# Patient Record
Sex: Female | Born: 1985
Health system: Southern US, Community
[De-identification: ages and names within clinical notes are randomized; demographics above are authoritative.]

## PROBLEM LIST (undated history)

## (undated) ENCOUNTER — Inpatient Hospital Stay (HOSPITAL_COMMUNITY): Payer: Self-pay

## (undated) DIAGNOSIS — R319 Hematuria, unspecified: Secondary | ICD-10-CM

## (undated) DIAGNOSIS — Z87448 Personal history of other diseases of urinary system: Secondary | ICD-10-CM

## (undated) DIAGNOSIS — I10 Essential (primary) hypertension: Secondary | ICD-10-CM

## (undated) DIAGNOSIS — E119 Type 2 diabetes mellitus without complications: Secondary | ICD-10-CM

## (undated) DIAGNOSIS — N189 Chronic kidney disease, unspecified: Secondary | ICD-10-CM

## (undated) DIAGNOSIS — N200 Calculus of kidney: Secondary | ICD-10-CM

## (undated) DIAGNOSIS — N62 Hypertrophy of breast: Secondary | ICD-10-CM

## (undated) DIAGNOSIS — F99 Mental disorder, not otherwise specified: Secondary | ICD-10-CM

## (undated) DIAGNOSIS — G43909 Migraine, unspecified, not intractable, without status migrainosus: Secondary | ICD-10-CM

## (undated) DIAGNOSIS — F329 Major depressive disorder, single episode, unspecified: Secondary | ICD-10-CM

## (undated) DIAGNOSIS — Z8742 Personal history of other diseases of the female genital tract: Secondary | ICD-10-CM

## (undated) DIAGNOSIS — M797 Fibromyalgia: Secondary | ICD-10-CM

## (undated) DIAGNOSIS — F32A Depression, unspecified: Secondary | ICD-10-CM

## (undated) HISTORY — DX: Hypertrophy of breast: N62

## (undated) HISTORY — DX: Depression, unspecified: F32.A

## (undated) HISTORY — DX: Type 2 diabetes mellitus without complications: E11.9

## (undated) HISTORY — DX: Personal history of other diseases of the female genital tract: Z87.42

## (undated) HISTORY — DX: Calculus of kidney: N20.0

## (undated) HISTORY — DX: Personal history of other diseases of urinary system: Z87.448

## (undated) HISTORY — PX: WISDOM TOOTH EXTRACTION: SHX21

## (undated) HISTORY — DX: Essential (primary) hypertension: I10

## (undated) HISTORY — PX: DENTAL SURGERY: SHX609

## (undated) HISTORY — DX: Hematuria, unspecified: R31.9

## (undated) HISTORY — DX: Mental disorder, not otherwise specified: F99

## (undated) HISTORY — DX: Chronic kidney disease, unspecified: N18.9

---
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
## 2006-01-02 ENCOUNTER — Ambulatory Visit (HOSPITAL_COMMUNITY): Admission: RE | Admit: 2006-01-02 | Discharge: 2006-01-02 | Payer: Self-pay | Admitting: Internal Medicine

## 2006-05-07 ENCOUNTER — Ambulatory Visit (HOSPITAL_COMMUNITY): Admission: RE | Admit: 2006-05-07 | Discharge: 2006-05-07 | Payer: Self-pay | Admitting: Urology

## 2006-07-03 ENCOUNTER — Ambulatory Visit (HOSPITAL_COMMUNITY): Admission: RE | Admit: 2006-07-03 | Discharge: 2006-07-03 | Payer: Self-pay | Admitting: Internal Medicine

## 2007-01-22 ENCOUNTER — Other Ambulatory Visit: Admission: RE | Admit: 2007-01-22 | Discharge: 2007-01-22 | Payer: Self-pay | Admitting: Obstetrics and Gynecology

## 2007-01-29 ENCOUNTER — Encounter: Admission: RE | Admit: 2007-01-29 | Discharge: 2007-01-29 | Payer: Self-pay | Admitting: Obstetrics & Gynecology

## 2007-05-07 ENCOUNTER — Ambulatory Visit (HOSPITAL_COMMUNITY): Admission: RE | Admit: 2007-05-07 | Discharge: 2007-05-07 | Payer: Self-pay | Admitting: Neurology

## 2008-07-18 ENCOUNTER — Other Ambulatory Visit: Admission: RE | Admit: 2008-07-18 | Discharge: 2008-07-18 | Payer: Self-pay | Admitting: Obstetrics & Gynecology

## 2010-04-14 ENCOUNTER — Encounter: Payer: Self-pay | Admitting: Neurology

## 2010-11-21 ENCOUNTER — Ambulatory Visit (HOSPITAL_COMMUNITY): Payer: Self-pay | Admitting: Psychiatry

## 2010-12-19 ENCOUNTER — Ambulatory Visit (HOSPITAL_COMMUNITY): Payer: Self-pay | Admitting: Psychiatry

## 2011-02-25 ENCOUNTER — Other Ambulatory Visit: Payer: Self-pay | Admitting: Obstetrics & Gynecology

## 2011-06-10 ENCOUNTER — Other Ambulatory Visit (HOSPITAL_COMMUNITY)
Admission: RE | Admit: 2011-06-10 | Discharge: 2011-06-10 | Disposition: A | Discharge status: Home / Self Care | Payer: BC Managed Care – PPO | Source: Ambulatory Visit | Attending: Obstetrics & Gynecology | Admitting: Obstetrics & Gynecology

## 2011-06-10 ENCOUNTER — Other Ambulatory Visit: Payer: Self-pay | Admitting: Obstetrics & Gynecology

## 2011-06-10 DIAGNOSIS — Z01419 Encounter for gynecological examination (general) (routine) without abnormal findings: Secondary | ICD-10-CM | POA: Insufficient documentation

## 2011-06-25 ENCOUNTER — Ambulatory Visit (HOSPITAL_COMMUNITY)
Admission: RE | Admit: 2011-06-25 | Discharge: 2011-06-25 | Disposition: A | Discharge status: Home / Self Care | Payer: BC Managed Care – PPO | Source: Ambulatory Visit | Attending: Internal Medicine | Admitting: Internal Medicine

## 2011-06-25 ENCOUNTER — Other Ambulatory Visit (HOSPITAL_COMMUNITY): Payer: Self-pay | Admitting: Internal Medicine

## 2011-06-25 DIAGNOSIS — R937 Abnormal findings on diagnostic imaging of other parts of musculoskeletal system: Secondary | ICD-10-CM | POA: Insufficient documentation

## 2011-06-25 DIAGNOSIS — M79609 Pain in unspecified limb: Secondary | ICD-10-CM | POA: Insufficient documentation

## 2011-06-25 DIAGNOSIS — R52 Pain, unspecified: Secondary | ICD-10-CM

## 2011-06-25 DIAGNOSIS — M25539 Pain in unspecified wrist: Secondary | ICD-10-CM | POA: Insufficient documentation

## 2011-09-06 ENCOUNTER — Encounter (HOSPITAL_COMMUNITY): Payer: Self-pay | Admitting: *Deleted

## 2011-09-06 ENCOUNTER — Emergency Department (HOSPITAL_COMMUNITY)
Admission: EM | Admit: 2011-09-06 | Discharge: 2011-09-06 | Disposition: A | Discharge status: Home / Self Care | Payer: BC Managed Care – PPO | Attending: Emergency Medicine | Admitting: Emergency Medicine

## 2011-09-06 DIAGNOSIS — H9209 Otalgia, unspecified ear: Secondary | ICD-10-CM | POA: Insufficient documentation

## 2011-09-06 DIAGNOSIS — J029 Acute pharyngitis, unspecified: Secondary | ICD-10-CM | POA: Insufficient documentation

## 2011-09-06 HISTORY — DX: Fibromyalgia: M79.7

## 2011-09-06 LAB — RAPID STREP SCREEN (MED CTR MEBANE ONLY): Streptococcus, Group A Screen (Direct): NEGATIVE

## 2011-09-06 MED ORDER — IBUPROFEN 800 MG PO TABS
800.0000 mg | ORAL_TABLET | Freq: Once | ORAL | Status: AC
  Administered 2011-09-06: 800 mg via ORAL
  Filled 2011-09-06: qty 1

## 2011-09-06 NOTE — ED Provider Notes (Signed)
History     CSN: [id number]  Arrival date & time 09/06/11  2012   First MD Initiated Contact with Patient 09/06/11 2039      Chief Complaint  Patient presents with  . Sore Throat    (Consider location/radiation/quality/duration/timing/severity/associated sxs/prior treatment) HPI Comments: Pt of dr. Ouida Sills   Patient is a 26 y.o. female presenting with pharyngitis. The history is provided by the patient. No language interpreter was used.  Sore Throat This is a new problem. Episode onset: 2 days ago.  mild ear pain.  no fever or chills no cough. The problem occurs constantly. The problem has been unchanged. Associated symptoms include a sore throat. Pertinent negatives include no chills, coughing, fever, headaches, rash or swollen glands. The symptoms are aggravated by swallowing. She has tried acetaminophen for the symptoms. The treatment provided no relief.    Past Medical History  Diagnosis Date  . Fibromyalgia     History reviewed. No pertinent past surgical history.  No family history on file.  History  Substance Use Topics  . Smoking status: Never Smoker   . Smokeless tobacco: Not on file  . Alcohol Use: No    OB History    Grav Para Term Preterm Abortions TAB SAB Ect Mult Living                  Review of Systems  Constitutional: Negative for fever and chills.  HENT: Positive for ear pain and sore throat.   Respiratory: Negative for cough.   Skin: Negative for rash.  Neurological: Negative for headaches.  All other systems reviewed and are negative.    Allergies  Review of patient's allergies indicates no known allergies.  Home Medications  No current outpatient prescriptions on file.  BP 145/81  Pulse 91  Temp 98.1 F (36.7 C) (Oral)  Resp 20  Ht 5\' 1"  (1.549 m)  Wt 206 lb (93.441 kg)  BMI 38.92 kg/m2  SpO2 100%  LMP 08/23/2011  Physical Exam  Nursing note and vitals reviewed. Constitutional: She is oriented to person, place, and time.  Vital signs are normal. She appears well-developed and well-nourished. She is cooperative. No distress.  HENT:  Head: Normocephalic and atraumatic. No trismus in the jaw.  Right Ear: External ear normal.  Left Ear: External ear normal.  Mouth/Throat: Uvula is midline and mucous membranes are normal. No uvula swelling. Posterior oropharyngeal erythema present. No oropharyngeal exudate, posterior oropharyngeal edema or tonsillar abscesses.  Eyes: EOM are normal.  Neck: Normal range of motion and phonation normal.  Cardiovascular: Normal rate, regular rhythm and normal heart sounds.   Pulmonary/Chest: Effort normal and breath sounds normal. No respiratory distress.  Abdominal: Soft. She exhibits no distension. There is no tenderness.  Musculoskeletal: Normal range of motion.  Neurological: She is alert and oriented to person, place, and time.  Skin: Skin is warm and dry.  Psychiatric: She has a normal mood and affect. Judgment normal.    ED Course  Procedures (including critical care time)   Labs Reviewed  RAPID STREP SCREEN   No results found.   1. Pharyngitis       MDM  Salt water gargles, chloraseptic.  Tylenol or ibuprofen for fever or discomfort.        Worthy Rancher, PA 09/06/11 2120

## 2011-09-06 NOTE — ED Provider Notes (Signed)
Medical screening examination/treatment/procedure(s) were performed by non-physician practitioner and as supervising physician I was immediately available for consultation/collaboration.   Rmoni Keplinger B. Shirl Weir, MD 09/06/11 2131 

## 2011-09-06 NOTE — Discharge Instructions (Signed)
Pharyngitis, Viral and Bacterial Pharyngitis is soreness (inflammation) or infection of the pharynx. It is also called a sore throat. CAUSES  Most sore throats are caused by viruses and are part of a cold. However, some sore throats are caused by strep and other bacteria. Sore throats can also be caused by post nasal drip from draining sinuses, allergies and sometimes from sleeping with an open mouth. Infectious sore throats can be spread from person to person by coughing, sneezing and sharing cups or eating utensils. TREATMENT  Sore throats that are viral usually last 3-4 days. Viral illness will get better without medications (antibiotics). Strep throat and other bacterial infections will usually begin to get better about 24-48 hours after you begin to take antibiotics. HOME CARE INSTRUCTIONS   If the caregiver feels there is a bacterial infection or if there is a positive strep test, they will prescribe an antibiotic. The full course of antibiotics must be taken. If the full course of antibiotic is not taken, you or your child may become ill again. If you or your child has strep throat and do not finish all of the medication, serious heart or kidney diseases may develop.   Drink enough water and fluids to keep your urine clear or pale yellow.   Only take over-the-counter or prescription medicines for pain, discomfort or fever as directed by your caregiver.   Get lots of rest.   Gargle with salt water ( tsp. of salt in a glass of water) as often as every 1-2 hours as you need for comfort.   Hard candies may soothe the throat if individual is not at risk for choking. Throat sprays or lozenges may also be used.  SEEK MEDICAL CARE IF:   Large, tender lumps in the neck develop.   A rash develops.   Green, yellow-brown or bloody sputum is coughed up.   Your baby is older than 3 months with a rectal temperature of 100.5 F (38.1 C) or higher for more than 1 day.  SEEK IMMEDIATE MEDICAL CARE  IF:   A stiff neck develops.   You or your child are drooling or unable to swallow liquids.   You or your child are vomiting, unable to keep medications or liquids down.   You or your child has severe pain, unrelieved with recommended medications.   You or your child are having difficulty breathing (not due to stuffy nose).   You or your child are unable to fully open your mouth.   You or your child develop redness, swelling, or severe pain anywhere on the neck.   You have a fever.   Your baby is older than 3 months with a rectal temperature of 102 F (38.9 C) or higher.   Your baby is 22 months old or younger with a rectal temperature of 100.4 F (38 C) or higher.  MAKE SURE YOU:   Understand these instructions.   Will watch your condition.   Will get help right away if you are not doing well or get worse.  Document Released: 03/10/2005 Document Revised: 02/27/2011 Document Reviewed: 06/07/2007 [facility] Patient Information 2012 [place], [place].   The strep screen is negative.  Gargle frequently with salt water.  Chloraseptic will help also.  Take tylenol up to 1000 mg every 4 hrs or ibuprofen 800 mg every 8 hrs for fever or discomfort.  Follow up with dr. Ouida Sills as needed.

## 2011-09-06 NOTE — ED Notes (Signed)
Sore throat for 2 days

## 2011-09-06 NOTE — ED Notes (Signed)
Pt alert & oriented x4, stable gait. Pt given discharge instructions, paperwork. Patient instructed to stop at the registration desk to finish any additional paperwork. pt verbalized understanding. Pt left department w/ no further questions.  

## 2011-11-11 ENCOUNTER — Ambulatory Visit (HOSPITAL_COMMUNITY)
Admission: RE | Admit: 2011-11-11 | Discharge: 2011-11-11 | Disposition: A | Discharge status: Home / Self Care | Payer: BC Managed Care – PPO | Source: Ambulatory Visit | Attending: Internal Medicine | Admitting: Internal Medicine

## 2011-11-11 DIAGNOSIS — M545 Low back pain, unspecified: Secondary | ICD-10-CM

## 2011-11-11 DIAGNOSIS — IMO0001 Reserved for inherently not codable concepts without codable children: Secondary | ICD-10-CM | POA: Insufficient documentation

## 2011-11-11 DIAGNOSIS — M6281 Muscle weakness (generalized): Secondary | ICD-10-CM | POA: Insufficient documentation

## 2011-11-11 DIAGNOSIS — M7918 Myalgia, other site: Secondary | ICD-10-CM | POA: Insufficient documentation

## 2011-11-11 HISTORY — DX: Low back pain, unspecified: M54.50

## 2011-11-11 NOTE — Evaluation (Signed)
Physical Therapy Evaluation  Patient Details  Name: Theresa Rosario MRN: [id number] Date of Birth: 01-13-1986  Today's Date: 11/11/2011 Time: 1125-1202 PT Time Calculation (min): 37 min Charges: 1 eval, 15' Self Care Visit#: 1  of 12   Re-eval: 12/11/11 Assessment Diagnosis: LBP Next MD Visit: Dr. Ouida Sills - 2 months Prior Therapy: None  Authorization: BCBS   Past Medical History:  Past Medical History  Diagnosis Date  . Fibromyalgia    Past Surgical History: No past surgical history on file.  Subjective Symptoms/Limitations Symptoms: PMH: Fibromyalgia Pertinent History: Pt is referred to PT for LBP which has been bothering her since high school.  She attended PT about 12 years ago to address low back pain. She reports that she has a heavy pain (as if someone was standing on top of that side) into her RLE and RUE.  Her c/co's are: increased pain, difficulty with traveling, walking and playing on her computer.  How long can you sit comfortably?: 30 minutes in her car.  also reports pain while sitting at church.  How long can you stand comfortably?: no longer than 30 minutes.  How long can you walk comfortably?: cannot walk for greater than 15 minutes.  Special Tests: - SLR Patient Stated Goals: "I want to reduce my pain."  Pain Assessment Currently in Pain?: Yes Pain Score:   6 Pain Location: Back Pain Orientation: Right Pain Relieving Factors: Pain medication (Tramadol, advil, tylenol) Effect of Pain on Daily Activities: has difficulty sleeping, sitting in the car for a long time.   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  LE muscle length WNL, has mod pain at end range of RLE piriformis stretch  Sensation/Coordination/Flexibility/Functional Tests Coordination Gross Motor Movements are Fluid and Coordinated: No Coordination and Movement Description: Pt able to complete TrA and PF activities while breathing with moderate substitutions.  Multifidus: pt holds breath and has maximal substitutions.  Functional Tests Functional Tests: ODI: 48%  Assessment RLE AROM (degrees) RLE Overall AROM Comments: upon visual inspection 10% decrease in hip IR compared to RLE RLE Strength RLE Overall Strength Comments: Hip IR and ER: 5/5 Right Hip Flexion: 3+/5 Right Hip Extension: 4/5 Right Hip ADduction: 5/5 Right Knee Flexion: 4/5 Right Knee Extension: 4/5 LLE Strength LLE Overall Strength Comments: Hip IR and ER: 5/5 Left Hip Flexion: 4/5 Left Hip Extension: 4/5 Left Hip ADduction: 5/5 Left Knee Flexion: 4/5 Left Knee Extension: 4/5 Lumbar AROM Overall Lumbar AROM Comments: L quadrant extension: WNL - most painful; R quadrant extension: WNL Lumbar Flexion: WNL - no pain  Lumbar Extension: WNL Lumbar - Right Side Bend: WNL Lumbar - Left Side Bend: WNL - pain Palpation Palpation: increased pain and tenderness over piriformis muscle spasms and fascial restrcitions.  Has muscle spasms throughout R gluteal region.  Tone is WNL.   Mobility/Balance  Posture/Postural Control Posture/Postural Control: Postural limitations Postural Limitations: slouched posture with hip ER while sitting   Exercise/Treatments Stretches Active Hamstring Stretch: 1 rep;30 seconds Piriformis Stretch: Limitations Piriformis Stretch Limitations: 3 ways: long sitting on bed while holding RLE 30 sec, Long sitting on bed with R rotation, All fours piriformis stretch 30 sec Seated  Other Seated Lumbar Exercises: Heel Roll outs 3x10 sec holds w/insturction for proper  posture Supine Ab Set: Limitations AB Set Limitations: Education, NMR to establish  Other Supine Lumbar Exercises: PF: education and NMR to establish Prone  Other Prone Lumbar Exercises: Multifidus: Education and NMR to establish  Physical Therapy Assessment and Plan PT Assessment and Plan Clinical Impression Statement: Pt is a 26 year old female referred to PT for LBP w/hx of fibromyalgia.  After examination today it is likely that she has increased R gluteal/piriformis musculoskeletal pain from impaired posture, mechanics and obesity.  Pt will benefit from skilled therapeutic intervention in order to improve on the following deficits: Pain;Improper body mechanics;Decreased strength;Increased muscle spasms;Increased fascial restricitons;Decreased coordination;Impaired perceived functional ability Rehab Potential: Good PT Frequency: Min 3X/week PT Duration: 8 weeks PT Treatment/Interventions: Therapeutic exercise;Therapeutic activities;Neuromuscular re-education;Manual techniques;Modalities;Patient/family education PT Plan: start with manual therapy (SCS to R piriformis if able), then stretching exercises, heel and toe roll in and outs, NMR for core activation to TrA, PF and multifidus.  Edcuate pt on proper sitting techniques w/towel roll to sacrum    Goals Home Exercise Program Pt will Perform Home Exercise Program: Independently PT Goal: Perform Home Exercise Program - Progress: Goal set today PT Short Term Goals Time to Complete Short Term Goals: 4 weeks PT Short Term Goal 1: Pt will report pain less than 4/10 for 50% of her day.  PT Short Term Goal 2: Pt will present with mild fascial restrictions and muscle spasms throughout her R gluteal region.  PT Short Term Goal 3: Pt will improve her core coordination and demonstarte TrA, PF and Multifidus activation w/mild subtititions.  PT Short Term Goal 4: Pt will be thouroughly educated proper posture and body mechanics for work and home in  order to decrease risk of secondary injuries.  PT Short Term Goal 5: Pt will improve her LE strength by 1 muscle grade.  PT Long Term Goals Time to Complete Long Term Goals: 8 weeks PT Long Term Goal 1: Pt will report pain less than 2/10 for 75% of her day for improved QOL. PT Long Term Goal 2: Pt will improve her ODI to less than or equal to 30% for improved QOL. Long Term Goal 3: Pt will improve her core strength to [facility] in order to tolerate standing for greater than 1 hour and sitting in her car for 60 minutes Long Term Goal 4: Pt will improve her LE strength to WNL in order to tolerate walking for greater than 30 minutes.   Problem List Patient Active Problem List  Diagnosis  . Lumbago  . Musculoskeletal pain    PT - End of Session Activity Tolerance: Patient tolerated treatment well PT Plan of Care PT Home Exercise Plan: see scanned report. Educated to start with piriformis stretching first and will discuss core activation and heel/toe roll in and outs next visit PT Patient Instructions: On importance of maintance with LBP and completing her HEP. Discussed about proper posture and spinal alginment to decrease pain.  Consulted and Agree with Plan of Care: Patient  Annett Fabian, PT 11/11/2011, 12:26 PM  Physician Documentation Your signature is required to indicate approval of the treatment plan as stated above.  Please sign and either send electronically or make a copy of this report for your files and return this physician signed original.   Please mark one 1.__approve of plan  2. ___approve of plan with the following conditions.   ______________________________  _____________________ Physician Signature                                                                                                             Date

## 2011-11-13 ENCOUNTER — Ambulatory Visit (HOSPITAL_COMMUNITY)
Admission: RE | Admit: 2011-11-13 | Discharge: 2011-11-13 | Disposition: A | Discharge status: Home / Self Care | Payer: BC Managed Care – PPO | Source: Ambulatory Visit | Attending: Internal Medicine | Admitting: Internal Medicine

## 2011-11-13 NOTE — Progress Notes (Signed)
Physical Therapy Treatment Patient Details  Name: Theresa Rosario MRN: [id number] Date of Birth: 30-Jun-1985  Today's Date: 11/13/2011 Time: 4098-1191 PT Time Calculation (min): 43 min  Visit#: 2  of 12   Re-eval: 12/11/11  Charge: Manual 10', NMR 10', therex 23'  Authorization: BCBS   Subjective: Symptoms/Limitations Symptoms: LBP 5/10, R>L Pain Assessment Currently in Pain?: Yes Pain Score:   5 Pain Location: Back  Objective:   Exercise/Treatments Stretches Active Hamstring Stretch: 3 reps;30 seconds Prone Mid Back Stretch: 3 reps;20 seconds (child's pose) Piriformis Stretch: Limitations Piriformis Stretch Limitations: 3 ways: long sitting on bed while holding RLE 30 sec, Long sitting on bed with R rotation, All fours piriformis stretch 30 sec Seated Other Seated Lumbar Exercises: Heel Roll outs 5x10 sec holds w/insturction for proper posture Other Seated Lumbar Exercises: Educated towel roll wihile sitting Supine Ab Set: Limitations AB Set Limitations: TrA, PFC with tactile/verbal cueing for correct musculature activation; 10x 10" Prone  Other Prone Lumbar Exercises: NMR Multifidus 10x10"  Manual Therapy Manual Therapy: Other (comment) Other Manual Therapy: SCS to R piriformis  Physical Therapy Assessment and Plan PT Assessment and Plan Clinical Impression Statement: Reviewed HEP for proper technique, pt required multimodal cueing for correct musculature contraction with TrA and PFC.  Pt educated on towel roll sitting for posture and pain relief, pt c/o tingling down R UE with towel roll sitting.  Pt with impaired IR with R LE noted during the heel roll out exercise. PT Plan: Continue with current POC with manual (SCS) to reduce spasms R piriformis, stretching, core strengthening.  Begin bridges and clams next session.    Goals    Problem List Patient Active Problem List  Diagnosis  . Lumbago  . Musculoskeletal pain    PT - End of Session Activity  Tolerance: Patient tolerated treatment well General Behavior During Session: [facility] for tasks performed Cognition: [facility] for tasks performed  GP    Juel Burrow 11/13/2011, 12:00 PM

## 2011-11-14 ENCOUNTER — Ambulatory Visit (HOSPITAL_COMMUNITY)
Admission: RE | Admit: 2011-11-14 | Discharge: 2011-11-14 | Disposition: A | Discharge status: Home / Self Care | Payer: BC Managed Care – PPO | Source: Ambulatory Visit | Attending: Internal Medicine | Admitting: Internal Medicine

## 2011-11-14 NOTE — Progress Notes (Addendum)
Physical Therapy Treatment Patient Details  Name: Theresa Rosario MRN: [id number] Date of Birth: 1985/06/02  Today's Date: 11/14/2011 Time: 4098-1191 PT Time Calculation (min): 52 min  Visit#: 3  of 12   Re-eval: 12/11/11  Charge: Therex 32', NMR 10'  manual 10'  Authorization: BCBS   Subjective: Symptoms/Limitations Symptoms: Feeling better today, pain R glut/hip scale 3/10. Pain Assessment Currently in Pain?: Yes Pain Score:   3 Pain Location: Buttocks Pain Orientation: Right  Objective:   Exercise/Treatments Stretches Active Hamstring Stretch: 3 reps;30 seconds Prone Mid Back Stretch: 3 reps;30 seconds;Limitations Piriformis Stretch: Limitations Piriformis Stretch Limitations: 3 ways: long sitting on bed while holding RLE 30 sec, Long sitting on bed with R rotation, All fours piriformis stretch 30 sec Seated Other Seated Lumbar Exercises: Heel Roll outs 10x10 sec holds w/insturction for proper posture Supine Bridge: 10 reps;5 seconds Sidelying Clam: 10 reps;Limitations Clam Limitations: 10" with tactile cueing for correct mm contraction Other Sidelying Lumbar Exercises: TrA and PFC with tactile cueing for proper mm contraction and to breath Prone  Single Arm Raise: 5 reps;5 seconds Straight Leg Raise: 5 reps;5 seconds Other Prone Lumbar Exercises: Multifidus 10x10" Other Prone Lumbar Exercises: Heel squeeze 5x 5"  Manual: STM to R piriformis x 10 min  Physical Therapy Assessment and Plan PT Assessment and Plan Clinical Impression Statement: Added glut strengtheing exercises to POC.  Pt required multimodal cueing for correct musculature and proper technique with S/L clam and TrA/PFC activities.   PT Plan: Continue with current POC with manual to reduce spasms R piriformis, stretching, core strengthening.  Begin supine bent knee raise next session.    Goals    Problem List Patient Active Problem List  Diagnosis  . Lumbago  . Musculoskeletal pain     PT - End of Session Activity Tolerance: Patient tolerated treatment well General Behavior During Session: [facility] for tasks performed Cognition: [facility] for tasks performed  GP    Juel Burrow 11/14/2011, 12:10 PM

## 2011-11-18 ENCOUNTER — Ambulatory Visit (HOSPITAL_COMMUNITY)
Admission: RE | Admit: 2011-11-18 | Discharge: 2011-11-18 | Disposition: A | Discharge status: Home / Self Care | Payer: BC Managed Care – PPO | Source: Ambulatory Visit | Attending: Internal Medicine | Admitting: Internal Medicine

## 2011-11-18 NOTE — Progress Notes (Signed)
Physical Therapy Treatment Patient Details  Name: Theresa Rosario MRN: [id number] Date of Birth: 08/22/1985  Today's Date: 11/18/2011 Time: 1126-1207 PT Time Calculation (min): 41 min Charges: 25' manual, 1 estim, 1 heat Visit#: 4  of 12   Re-eval: 12/11/11   Subjective: Symptoms/Limitations Symptoms: Continues to have pain to R posterior hip region. She reports she is walking about 15 minutes, sitting in her car for 30 minutes   Exercise/Treatments  Modalities Modalities: Electrical Stimulation;Moist Heat Manual Therapy Other Manual Therapy: SCS to R gluteal region w/STM to follow x8 locations.  MET to correct R anterior/L posterior pelvic rotation w/pubic clearing.  x25 minutes Moist Heat Therapy Number Minutes Moist Heat: 10 Minutes Moist Heat Location: Other (comment) (R gluteal region) Museum/gallery exhibitions officer Stimulation Location: R gluteal region Electrical Stimulation Action: IFES x10 minutes  Electrical Stimulation Parameters: Freq: 80/150 Volts Range: 9.2-16.5 volts Electrical Stimulation Goals: Pain  Physical Therapy Assessment and Plan PT Assessment and Plan Clinical Impression Statement: pt continues to have moderate piriformis spasms throughout R gluteal region and had moderate alignment dysfunction which was corrected with manual techniques.  She had decreased pain after modalities today.  PT Plan: Continue to reduce pain and improve LE strength. Begin supine bent knee raise next session    Goals    Problem List Patient Active Problem List  Diagnosis  . Lumbago  . Musculoskeletal pain   Maclean Foister, PT 11/18/2011, 12:05 PM

## 2011-11-19 ENCOUNTER — Inpatient Hospital Stay (HOSPITAL_COMMUNITY): Admission: RE | Admit: 2011-11-19 | Payer: BC Managed Care – PPO | Source: Ambulatory Visit

## 2011-11-21 ENCOUNTER — Ambulatory Visit (HOSPITAL_COMMUNITY)
Admission: RE | Admit: 2011-11-21 | Discharge: 2011-11-21 | Disposition: A | Discharge status: Home / Self Care | Payer: BC Managed Care – PPO | Source: Ambulatory Visit | Attending: Internal Medicine | Admitting: Internal Medicine

## 2011-11-21 NOTE — Progress Notes (Signed)
Physical Therapy Treatment Patient Details  Name: Theresa Rosario MRN: [id number] Date of Birth: 1985-12-01  Today's Date: 11/21/2011 Time: 1104-1155 PT Time Calculation (min): 51 min Charges: 1 estim, 1heat, 23' Manual, 8' TE Visit#: 5  of 12   Re-eval: 12/11/11   Subjective: Symptoms/Limitations Symptoms: She reports that her low back is feeling good today, but reports she thinks the pain has migrated up to her neck as it feels about the same as her low back did.   Exercise/Treatments Prone Exercises Shoulder Extension: 10 reps Rows: 10 reps Other Prone Exercise: POE: cervical rotaion, flexion-extension, sidebending, SA x20 each Other Prone Exercise: shoulder abduction x10 Prone  Single Arm Raise: Right;Left;5 reps Straight Leg Raise: 5 reps (R and L ) Opposite Arm/Leg Raise: Right arm/Left leg;Left arm/Right leg;5 reps  Modalities Modalities: Electrical Stimulation;Moist Heat Manual Therapy Manual Therapy: Joint mobilization Joint Mobilization: Grade I-II PA cervical 4-thoracic 3 to decreased pain w/STM to scapular region after to improve circulation and decrease pain.  Moist Heat Therapy Number Minutes Moist Heat: 20 Minutes Moist Heat Location:  (neck) Electrical Stimulation Electrical Stimulation Location: L scapular region w/pt laying in prone Electrical Stimulation Action: IFES x20 minutes  Electrical Stimulation Parameters: Freq: 80/150 Volts Range: 9.2- 14  Electrical Stimulation Goals: Pain  Physical Therapy Assessment and Plan PT Assessment and Plan Clinical Impression Statement: Pt continues to make progress with decreased pain to her lumbar region and has improved coordination to core musculature.  Demonstrated significant impairement in SA coordination today with impaired cervical L rotation and continues to require mod cueing for proper posture.  PT Plan: f/u w/neck pain    Goals    Problem List Patient Active Problem List  Diagnosis  . Lumbago    . Musculoskeletal pain   Madelein Mahadeo, PT 11/21/2011, 11:46 AM

## 2011-11-26 ENCOUNTER — Ambulatory Visit (HOSPITAL_COMMUNITY): Payer: BC Managed Care – PPO | Admitting: Physical Therapy

## 2011-11-28 ENCOUNTER — Ambulatory Visit (HOSPITAL_COMMUNITY)
Admission: RE | Admit: 2011-11-28 | Discharge: 2011-11-28 | Disposition: A | Discharge status: Home / Self Care | Payer: BC Managed Care – PPO | Source: Ambulatory Visit | Attending: Internal Medicine | Admitting: Internal Medicine

## 2011-11-28 DIAGNOSIS — M545 Low back pain, unspecified: Secondary | ICD-10-CM | POA: Insufficient documentation

## 2011-11-28 DIAGNOSIS — IMO0001 Reserved for inherently not codable concepts without codable children: Secondary | ICD-10-CM | POA: Insufficient documentation

## 2011-11-28 DIAGNOSIS — M6281 Muscle weakness (generalized): Secondary | ICD-10-CM | POA: Insufficient documentation

## 2011-11-28 NOTE — Progress Notes (Signed)
Physical Therapy Treatment Patient Details  Name: Theresa Rosario MRN: [id number] Date of Birth: 1986/03/03  Today's Date: 11/28/2011 Time: 1100-1155 PT Time Calculation (min): 55 min  Visit#: 6  of 12   Re-eval: 12/11/11  Charge: therex 28 Manual 12 IFES/MHP 15' unattended 1 unit  Authorization: BCBS   Subjective: Symptoms/Limitations Symptoms: Pt reported posterior neck and R side LBP, pain scale 5/10 today. Pain Assessment Currently in Pain?: Yes Pain Score:   5 Pain Location: Back Pain Orientation: Right;Lower   Exercise/Treatments Prone Exercises Shoulder Extension: 10 reps Rows: 10 reps Other Prone Exercise: POE: cervical rotaion, flexion-extension, sidebending, SA x20 each  Prone  Opposite Arm/Leg Raise: Right arm/Left leg;Left arm/Right leg;10 reps;5 seconds Other Prone Lumbar Exercises: Multifidus 10x10" good control for correct musculature activation  Treadmill @ 1.2-->1.5 following MET   Modalities Modalities: Electrical Stimulation;Moist Heat Moist Heat Therapy Moist Heat Location: Other (comment) (Neck and lumbar/gluteal region) Electrical Stimulation Electrical Stimulation Location: Cervical and lumbar/gluteal region Electrical Stimulation Goals: Pain  Physical Therapy Assessment and Plan PT Assessment and Plan Clinical Impression Statement: MET complete for proper SI alignment with improved gait mechanics noted following. Pt improving coordination with core musculature but does require tactile cueing for correct musculature activation. Pt stated pain reduced at end of session following IFES/MHP to cervical and lumbar region today PT Plan: Continue with current POC.    Goals    Problem List Patient Active Problem List  Diagnosis  . Lumbago  . Musculoskeletal pain    PT - End of Session Activity Tolerance: Patient tolerated treatment well General Behavior During Session: [facility] for tasks performed Cognition: [facility] for tasks performed  GP    Juel Burrow 11/28/2011, 7:01 PM

## 2011-12-01 ENCOUNTER — Ambulatory Visit (HOSPITAL_COMMUNITY)
Admission: RE | Admit: 2011-12-01 | Discharge: 2011-12-01 | Disposition: A | Discharge status: Home / Self Care | Payer: BC Managed Care – PPO | Source: Ambulatory Visit | Attending: Internal Medicine | Admitting: Internal Medicine

## 2011-12-01 NOTE — Progress Notes (Signed)
Physical Therapy Treatment Patient Details  Name: Theresa Rosario MRN: [id number] Date of Birth: 28-Dec-1985  Today's Date: 12/01/2011 Time: 4098-1191 PT Time Calculation (min): 39 min Charges: 9' TE, 30' Manual Visit#: 7  of 12   Re-eval: 12/11/11       Subjective: Symptoms/Limitations Symptoms: Pt reports MET helped last visit, but did not last.  Overall continues to have pain 6/10 today, on average 5/10.  Able to indepedently explain her HEP Pain Assessment Currently in Pain?: Yes Pain Score:   6 Pain Location: Back Pain Orientation: Right  Precautions/Restrictions     Exercise/Treatments Stretches Piriformis Stretch: 3 reps;60 seconds (manually R knee to opp chest) Aerobic Tread Mill: 5' @ 1.5 following @ end of session to encourage proper hip alignment Supine Ab Set: 5 reps (10 sec holds) Bent Knee Raise: 10 reps (BLE w/ab set) Sidelying Clam: 10 reps Clam Limitations: 10" with tactile cueing for correct mm contraction Prone  Other Prone Lumbar Exercises: Multifidus 10x10" good control for correct musculature activation  Manual Therapy Manual Therapy: Other (comment) Other Manual Therapy: MET w/pubic clearing to correct R anterior/L posterior hip.  SCS to R hip flexior and TFL (x2x ea location)  Physical Therapy Assessment and Plan PT Assessment and Plan Clinical Impression Statement: Pt continues to have decreased fascial restrcitons and muscle spams in gluteal and hip flexor region. She is able to demonstrate appropriate NM control to core musculature. She is responding well to MET, however is not mainitaing due to overall core weakness.  Educated and discussed importance of posture and core activation throughout the day to decrease her overall pain.  Pain at end of tx 4/10 to R facet region PT Plan: Continue with current POC.    Goals Home Exercise Program Pt will Perform Home Exercise Program: Independently PT Short Term Goals Time to Complete Short Term  Goals: 4 weeks PT Short Term Goal 1: Pt will report pain less than 4/10 for 50% of her day.  PT Short Term Goal 1 - Progress:  (5/10) PT Short Term Goal 2: Pt will present with mild fascial restrictions and muscle spasms throughout her R gluteal region.  PT Short Term Goal 3: Pt will improve her core coordination and demonstarte TrA, PF and Multifidus activation w/mild subtititions.  PT Short Term Goal 4: Pt will be thouroughly educated proper posture and body mechanics for work and home in order to decrease risk of secondary injuries.  PT Short Term Goal 5: Pt will improve her LE strength by 1 muscle grade.  PT Long Term Goals Time to Complete Long Term Goals: 8 weeks PT Long Term Goal 1: Pt will report pain less than 2/10 for 75% of her day for improved QOL. PT Long Term Goal 2: Pt will improve her ODI to less than or equal to 30% for improved QOL. Long Term Goal 3: Pt will improve her core strength to [facility] in order to tolerate standing for greater than 1 hour and sitting in her car for 60 minutes Long Term Goal 4: Pt will improve her LE strength to WNL in order to tolerate walking for greater than 30 minutes.   Problem List Patient Active Problem List  Diagnosis  . Lumbago  . Musculoskeletal pain    PT - End of Session Activity Tolerance: Patient tolerated treatment well General Behavior During Session: [facility] for tasks performed Cognition: [facility] for tasks performed PT Plan of Care PT Patient Instructions: Educated on proper posture and core activiation and to decrease  gluteal activation.    Raydell Maners, PT 12/01/2011, 12:11 PM

## 2011-12-03 ENCOUNTER — Ambulatory Visit (HOSPITAL_COMMUNITY)
Admission: RE | Admit: 2011-12-03 | Discharge: 2011-12-03 | Disposition: A | Discharge status: Home / Self Care | Payer: BC Managed Care – PPO | Source: Ambulatory Visit | Attending: Internal Medicine | Admitting: Internal Medicine

## 2011-12-03 NOTE — Progress Notes (Signed)
Physical Therapy Treatment Patient Details  Name: RASHEA HOSKIE MRN: [id number] Date of Birth: 17-Feb-1986  Today's Date: 12/03/2011 Time: 4098-1191 PT Time Calculation (min): 53 min  Visit#: 8  of 12   Re-eval: 12/11/11  Charge: manual 12, therex 30, MHP 1 unit  Authorization: BCBS   Subjective: Symptoms/Limitations Symptoms: Pt reports LBP R side 4/10 today.  Objective:   Exercise/Treatments Aerobic Tread Mill: 5' @ 1.6 following MET to encourage proper hip alignment Seated Other Seated Lumbar Exercises: heel roll outs 10x 10" holds with noted limited R IR Supine Ab Set: 10 reps;Limitations AB Set Limitations: TrA, PFC with tactile/verbal cueing for correct musculature activation; 10x 10" Bent Knee Raise: 10 reps;5 seconds;Limitations Bent Knee Raise Limitations: BLE w/ ab sets Sidelying Clam: 10 reps Clam Limitations: 10" with tactile cueing for correct mm contraction  Modalities Modalities: Moist Heat Manual Therapy Other Manual Therapy: MET for R outflare w/ pubic clearing and treadmill x 5 min following.   Moist Heat Therapy Number Minutes Moist Heat: 10 Minutes Moist Heat Location:  (Lumbar/gluteal region in prone)  Physical Therapy Assessment and Plan PT Assessment and Plan Clinical Impression Statement: NMR improved control with PFC and Multifidus with less tactile cueing required for correct musculature activation.  MET complete with good SI alignment following treadmill, however is not maintaining due to overall weak core musculature.  Pt encourage to drink extra water following session to reduce risk of headaches. PT Plan: Continue with current POC.    Goals    Problem List Patient Active Problem List  Diagnosis  . Lumbago  . Musculoskeletal pain    PT - End of Session Activity Tolerance: Patient tolerated treatment well General Behavior During Session: [facility] for tasks performed Cognition: [facility] for tasks performed  GP    Juel Burrow 12/03/2011, 12:17 PM

## 2011-12-05 ENCOUNTER — Ambulatory Visit (HOSPITAL_COMMUNITY)
Admission: RE | Admit: 2011-12-05 | Discharge: 2011-12-05 | Disposition: A | Discharge status: Home / Self Care | Payer: BC Managed Care – PPO | Source: Ambulatory Visit | Attending: Internal Medicine | Admitting: Internal Medicine

## 2011-12-05 NOTE — Progress Notes (Signed)
Physical Therapy Treatment Patient Details  Name: Theresa Rosario MRN: [id number] Date of Birth: [birth year]  Today's Date: 12/05/2011 Time: 1102-1212 PT Time Calculation (min): 70 min Charges: 30' Manual, 15' TE, 1 E-stim, 1 heat Visit#: 9  of 12   Re-eval: 12/11/11   Subjective: Symptoms/Limitations Symptoms: Pt reports that she is about the same.  Reports improved posture and body awareness Pain Assessment Currently in Pain?: Yes Pain Score:   4 Pain Location: Back Pain Orientation: Right  Exercise/Treatments Aerobic Tread Mill: 7' @ 2.0 following kinesotape Seated Other Seated Lumbar Exercises: heel roll outs 10x 10" holds w/cueing for diaphragmatic breathing and posture Sidelying Clam: 10 reps Clam Limitations: 10 sec holds w/pulsing at end range x10  (BLE) Prone  Opposite Arm/Leg Raise: Right arm/Left leg;Left arm/Right leg;15 reps  Modalities Modalities: Electrical Stimulation;Moist Heat Manual Therapy Manual Therapy: Myofascial release Myofascial Release: PRONE: to decrease fascial restrcitions to R gluteal, piriformis region w/STM after to promote circulation Other Manual Therapy: PRONE: TPR to gluteal and piriformis region x3 locations, 100% reduction in spasms, continued pain, SUPINE: SCS to R hip flexion x3 locations w/100% resolve in pain and spams.  KINESOTAPE to R piriformis and SI for NMR Moist Heat Therapy Number Minutes Moist Heat: 15 Minutes Moist Heat Location: Other (comment) (back) Museum/gallery exhibitions officer Stimulation Location: R Lumboscaral region  Electrical Stimulation Action: IFES x 15 to reduce pain w/MH Electrical Stimulation Goals: Pain  Physical Therapy Assessment and Plan PT Assessment and Plan Clinical Impression Statement: Pt continues to be limited by pain with a signifcant decrease in muscle spasms and fascial restrcitions after manual techniques.  PT Plan: F/U on kinesotape.  Continue to improve core stability and  postural strength    Goals    Problem List Patient Active Problem List  Diagnosis  . Lumbago  . Musculoskeletal pain   Ark Agrusa, PT 12/05/2011, 12:07 PM

## 2011-12-09 ENCOUNTER — Ambulatory Visit (HOSPITAL_COMMUNITY): Payer: BC Managed Care – PPO | Admitting: Physical Therapy

## 2011-12-11 ENCOUNTER — Ambulatory Visit (HOSPITAL_COMMUNITY): Payer: BC Managed Care – PPO | Admitting: Physical Therapy

## 2012-03-24 NOTE — L&D Delivery Note (Signed)
Delivery Note At 6:12 PM a viable and healthy female was delivered via Vaginal, Spontaneous Delivery (Presentation: Left Occiput Anterior).  APGAR: 9, 9; weight pending.   Placenta status: Intact, Spontaneous.  Cord: 3 vessels with the following complications: None.    Anesthesia: Epidural  Episiotomy: None Lacerations: 2nd degree;Perineal Suture Repair: 3.0 vicryl Est. Blood Loss (mL): 400  Mom to postpartum.  Baby to nursery-stable.  Tawni Carnes 10/29/2012, 6:52 PM   I was present for and supervised the delivery by Dr. Waynetta Sandy.  Pt with 2nd degree perineal lac repaired without difficulty with 3.0 vicryl.  Hemostasis achieved.  No other complications.  Baby with spontaneous cry.   Lastacia Solum, Redmond Baseman, MD

## 2012-06-29 ENCOUNTER — Encounter: Payer: Self-pay | Admitting: *Deleted

## 2012-06-29 DIAGNOSIS — Z8742 Personal history of other diseases of the female genital tract: Secondary | ICD-10-CM | POA: Insufficient documentation

## 2012-06-29 DIAGNOSIS — Z87448 Personal history of other diseases of urinary system: Secondary | ICD-10-CM

## 2012-06-30 ENCOUNTER — Ambulatory Visit (INDEPENDENT_AMBULATORY_CARE_PROVIDER_SITE_OTHER): Payer: BC Managed Care – PPO | Admitting: Obstetrics & Gynecology

## 2012-06-30 ENCOUNTER — Encounter: Payer: Self-pay | Admitting: Obstetrics & Gynecology

## 2012-06-30 ENCOUNTER — Other Ambulatory Visit: Payer: Self-pay | Admitting: Obstetrics & Gynecology

## 2012-06-30 VITALS — BP 134/80 | Ht 61.0 in | Wt 185.0 lb

## 2012-06-30 DIAGNOSIS — Z3201 Encounter for pregnancy test, result positive: Secondary | ICD-10-CM

## 2012-06-30 DIAGNOSIS — Z3200 Encounter for pregnancy test, result unknown: Secondary | ICD-10-CM

## 2012-06-30 DIAGNOSIS — O3680X0 Pregnancy with inconclusive fetal viability, not applicable or unspecified: Secondary | ICD-10-CM

## 2012-06-30 LAB — POCT URINE PREGNANCY: Preg Test, Ur: POSITIVE

## 2012-07-02 ENCOUNTER — Other Ambulatory Visit: Payer: Self-pay | Admitting: Obstetrics & Gynecology

## 2012-07-02 ENCOUNTER — Ambulatory Visit (INDEPENDENT_AMBULATORY_CARE_PROVIDER_SITE_OTHER): Payer: BC Managed Care – PPO

## 2012-07-02 DIAGNOSIS — O26849 Uterine size-date discrepancy, unspecified trimester: Secondary | ICD-10-CM

## 2012-07-02 DIAGNOSIS — O3680X0 Pregnancy with inconclusive fetal viability, not applicable or unspecified: Secondary | ICD-10-CM

## 2012-07-02 DIAGNOSIS — O3680X1 Pregnancy with inconclusive fetal viability, fetus 1: Secondary | ICD-10-CM

## 2012-07-02 NOTE — Progress Notes (Signed)
U/S-active breech fetus, all meas c/w 21+5 wks EDD=11/07/12, EFW 1 lb 1 oz (484gms), fluid wnl, no obvious major abnl noted although post gr 0 plac with marginal previa noted, female fetus, would like to reck plac at 28 wks

## 2012-07-06 LAB — US OB COMP + 14 WK

## 2012-07-13 ENCOUNTER — Encounter: Payer: Self-pay | Admitting: Women's Health

## 2012-07-13 ENCOUNTER — Ambulatory Visit (INDEPENDENT_AMBULATORY_CARE_PROVIDER_SITE_OTHER): Payer: BC Managed Care – PPO | Admitting: Women's Health

## 2012-07-13 VITALS — BP 130/80 | Wt 183.0 lb

## 2012-07-13 DIAGNOSIS — O0932 Supervision of pregnancy with insufficient antenatal care, second trimester: Secondary | ICD-10-CM

## 2012-07-13 DIAGNOSIS — O4410 Placenta previa with hemorrhage, unspecified trimester: Secondary | ICD-10-CM

## 2012-07-13 DIAGNOSIS — F418 Other specified anxiety disorders: Secondary | ICD-10-CM | POA: Insufficient documentation

## 2012-07-13 DIAGNOSIS — F419 Anxiety disorder, unspecified: Secondary | ICD-10-CM

## 2012-07-13 DIAGNOSIS — O9989 Other specified diseases and conditions complicating pregnancy, childbirth and the puerperium: Secondary | ICD-10-CM

## 2012-07-13 DIAGNOSIS — Z3402 Encounter for supervision of normal first pregnancy, second trimester: Secondary | ICD-10-CM

## 2012-07-13 DIAGNOSIS — M797 Fibromyalgia: Secondary | ICD-10-CM

## 2012-07-13 DIAGNOSIS — IMO0001 Reserved for inherently not codable concepts without codable children: Secondary | ICD-10-CM

## 2012-07-13 DIAGNOSIS — O210 Mild hyperemesis gravidarum: Secondary | ICD-10-CM

## 2012-07-13 DIAGNOSIS — O99340 Other mental disorders complicating pregnancy, unspecified trimester: Secondary | ICD-10-CM

## 2012-07-13 DIAGNOSIS — O0930 Supervision of pregnancy with insufficient antenatal care, unspecified trimester: Secondary | ICD-10-CM

## 2012-07-13 DIAGNOSIS — O219 Vomiting of pregnancy, unspecified: Secondary | ICD-10-CM

## 2012-07-13 LAB — POCT URINALYSIS DIPSTICK: Glucose, UA: NEGATIVE

## 2012-07-13 MED ORDER — DOXYLAMINE-PYRIDOXINE 10-10 MG PO TBEC: 10.0000 mg | DELAYED_RELEASE_TABLET | ORAL | Status: DC

## 2012-07-13 MED ORDER — PNV PRENATAL PLUS MULTIVITAMIN 27-1 MG PO TABS: 1.0000 | ORAL_TABLET | Freq: Every day | ORAL | Status: DC

## 2012-07-13 NOTE — Patient Instructions (Addendum)
It is ok for you to continue taking your wellbutrin and flexeril for fibromyalgia.  Pregnancy - Second Trimester The second trimester of pregnancy (3 to 6 months) is a period of rapid growth for you and your baby. At the end of the sixth month, your baby is about 9 inches long and weighs 1 1/2 pounds. You will begin to feel the baby move between 18 and 20 weeks of the pregnancy. This is called quickening. Weight gain is faster. A clear fluid (colostrum) may leak out of your breasts. You may feel small contractions of the womb (uterus). This is known as false labor or Braxton-Hicks contractions. This is like a practice for labor when the baby is ready to be born. Usually, the problems with morning sickness have usually passed by the end of your first trimester. Some women develop small dark blotches (called cholasma, mask of pregnancy) on their face that usually goes away after the baby is born. Exposure to the sun makes the blotches worse. Acne may also develop in some pregnant women and pregnant women who have acne, may find that it goes away. PRENATAL EXAMS  Blood work may continue to be done during prenatal exams. These tests are done to check on your health and the probable health of your baby. Blood work is used to follow your blood levels (hemoglobin). Anemia (low hemoglobin) is common during pregnancy. Iron and vitamins are given to help prevent this. You will also be checked for diabetes between 24 and 28 weeks of the pregnancy. Some of the previous blood tests may be repeated.  The size of the uterus is measured during each visit. This is to make sure that the baby is continuing to grow properly according to the dates of the pregnancy.  Your blood pressure is checked every prenatal visit. This is to make sure you are not getting toxemia.  Your urine is checked to make sure you do not have an infection, diabetes or protein in the urine.  Your weight is checked often to make sure gains are  happening at the suggested rate. This is to ensure that both you and your baby are growing normally.  Sometimes, an ultrasound is performed to confirm the proper growth and development of the baby. This is a test which bounces harmless sound waves off the baby so your caregiver can more accurately determine due dates. Sometimes, a specialized test is done on the amniotic fluid surrounding the baby. This test is called an amniocentesis. The amniotic fluid is obtained by sticking a needle into the belly (abdomen). This is done to check the chromosomes in instances where there is a concern about possible genetic problems with the baby. It is also sometimes done near the end of pregnancy if an early delivery is required. In this case, it is done to help make sure the baby's lungs are mature enough for the baby to live outside of the womb. CHANGES OCCURING IN THE SECOND TRIMESTER OF PREGNANCY Your body goes through many changes during pregnancy. They vary from person to person. Talk to your caregiver about changes you notice that you are concerned about.  During the second trimester, you will likely have an increase in your appetite. It is normal to have cravings for certain foods. This varies from person to person and pregnancy to pregnancy.  Your lower abdomen will begin to bulge.  You may have to urinate more often because the uterus and baby are pressing on your bladder. It is also common  to get more bladder infections during pregnancy (pain with urination). You can help this by drinking lots of fluids and emptying your bladder before and after intercourse.  You may begin to get stretch marks on your hips, abdomen, and breasts. These are normal changes in the body during pregnancy. There are no exercises or medications to take that prevent this change.  You may begin to develop swollen and bulging veins (varicose veins) in your legs. Wearing support hose, elevating your feet for 15 minutes, 3 to 4  times a day and limiting salt in your diet helps lessen the problem.  Heartburn may develop as the uterus grows and pushes up against the stomach. Antacids recommended by your caregiver helps with this problem. Also, eating smaller meals 4 to 5 times a day helps.  Constipation can be treated with a stool softener or adding bulk to your diet. Drinking lots of fluids, vegetables, fruits, and whole grains are helpful.  Exercising is also helpful. If you have been very active up until your pregnancy, most of these activities can be continued during your pregnancy. If you have been less active, it is helpful to start an exercise program such as walking.  Hemorrhoids (varicose veins in the rectum) may develop at the end of the second trimester. Warm sitz baths and hemorrhoid cream recommended by your caregiver helps hemorrhoid problems.  Backaches may develop during this time of your pregnancy. Avoid heavy lifting, wear low heal shoes and practice good posture to help with backache problems.  Some pregnant women develop tingling and numbness of their hand and fingers because of swelling and tightening of ligaments in the wrist (carpel tunnel syndrome). This goes away after the baby is born.  As your breasts enlarge, you may have to get a bigger bra. Get a comfortable, cotton, support bra. Do not get a nursing bra until the last month of the pregnancy if you will be nursing the baby.  You may get a dark line from your belly button to the pubic area called the linea nigra.  You may develop rosy cheeks because of increase blood flow to the face.  You may develop spider looking lines of the face, neck, arms and chest. These go away after the baby is born. HOME CARE INSTRUCTIONS   It is extremely important to avoid all smoking, herbs, alcohol, and unprescribed drugs during your pregnancy. These chemicals affect the formation and growth of the baby. Avoid these chemicals throughout the pregnancy to ensure  the delivery of a healthy infant.  Most of your home care instructions are the same as suggested for the first trimester of your pregnancy. Keep your caregiver's appointments. Follow your caregiver's instructions regarding medication use, exercise and diet.  During pregnancy, you are providing food for you and your baby. Continue to eat regular, well-balanced meals. Choose foods such as meat, fish, milk and other low fat dairy products, vegetables, fruits, and whole-grain breads and cereals. Your caregiver will tell you of the ideal weight gain.  A physical sexual relationship may be continued up until near the end of pregnancy if there are no other problems. Problems could include early (premature) leaking of amniotic fluid from the membranes, vaginal bleeding, abdominal pain, or other medical or pregnancy problems.  Exercise regularly if there are no restrictions. Check with your caregiver if you are unsure of the safety of some of your exercises. The greatest weight gain will occur in the last 2 trimesters of pregnancy. Exercise will help you:  Control your weight.  Get you in shape for labor and delivery.  Lose weight after you have the baby.  Wear a good support or jogging bra for breast tenderness during pregnancy. This may help if worn during sleep. Pads or tissues may be used in the bra if you are leaking colostrum.  Do not use hot tubs, steam rooms or saunas throughout the pregnancy.  Wear your seat belt at all times when driving. This protects you and your baby if you are in an accident.  Avoid raw meat, uncooked cheese, cat litter boxes and soil used by cats. These carry germs that can cause birth defects in the baby.  The second trimester is also a good time to visit your dentist for your dental health if this has not been done yet. Getting your teeth cleaned is OK. Use a soft toothbrush. Brush gently during pregnancy.  It is easier to loose urine during pregnancy. Tightening up  and strengthening the pelvic muscles will help with this problem. Practice stopping your urination while you are going to the bathroom. These are the same muscles you need to strengthen. It is also the muscles you would use as if you were trying to stop from passing gas. You can practice tightening these muscles up 10 times a set and repeating this about 3 times per day. Once you know what muscles to tighten up, do not perform these exercises during urination. It is more likely to contribute to an infection by backing up the urine.  Ask for help if you have financial, counseling or nutritional needs during pregnancy. Your caregiver will be able to offer counseling for these needs as well as refer you for other special needs.  Your skin may become oily. If so, wash your face with mild soap, use non-greasy moisturizer and oil or cream based makeup. MEDICATIONS AND DRUG USE IN PREGNANCY  Take prenatal vitamins as directed. The vitamin should contain 1 milligram of folic acid. Keep all vitamins out of reach of children. Only a couple vitamins or tablets containing iron may be fatal to a baby or young child when ingested.  Avoid use of all medications, including herbs, over-the-counter medications, not prescribed or suggested by your caregiver. Only take over-the-counter or prescription medicines for pain, discomfort, or fever as directed by your caregiver. Do not use aspirin.  Let your caregiver also know about herbs you may be using.  Alcohol is related to a number of birth defects. This includes fetal alcohol syndrome. All alcohol, in any form, should be avoided completely. Smoking will cause low birth rate and premature babies.  Street or illegal drugs are very harmful to the baby. They are absolutely forbidden. A baby born to an addicted mother will be addicted at birth. The baby will go through the same withdrawal an adult does. SEEK MEDICAL CARE IF:  You have any concerns or worries during your  pregnancy. It is better to call with your questions if you feel they cannot wait, rather than worry about them. SEEK IMMEDIATE MEDICAL CARE IF:   An unexplained oral temperature above 102 F (38.9 C) develops, or as your caregiver suggests.  You have leaking of fluid from the vagina (birth canal). If leaking membranes are suspected, take your temperature and tell your caregiver of this when you call.  There is vaginal spotting, bleeding, or passing clots. Tell your caregiver of the amount and how many pads are used. Light spotting in pregnancy is common, especially following intercourse.  You develop a bad smelling vaginal discharge with a change in the color from clear to white.  You continue to feel sick to your stomach (nauseated) and have no relief from remedies suggested. You vomit blood or coffee ground-like materials.  You lose more than 2 pounds of weight or gain more than 2 pounds of weight over 1 week, or as suggested by your caregiver.  You notice swelling of your face, hands, feet, or legs.  You get exposed to [place] measles and have never had them.  You are exposed to fifth disease or chickenpox.  You develop belly (abdominal) pain. Round ligament discomfort is a common non-cancerous (benign) cause of abdominal pain in pregnancy. Your caregiver still must evaluate you.  You develop a bad headache that does not go away.  You develop fever, diarrhea, pain with urination, or shortness of breath.  You develop visual problems, blurry, or double vision.  You fall or are in a car accident or any kind of trauma.  There is mental or physical violence at home. Document Released: 03/04/2001 Document Revised: 06/02/2011 Document Reviewed: 09/06/2008 [facility] Patient Information 2013 [place], [place].

## 2012-07-13 NOTE — Progress Notes (Addendum)
  Subjective:    Theresa Rosario is a 27 y.o. G1P0 african-american female at [redacted]w[redacted]d by 21wk u/s, being seen today for her first obstetrical visit.  Her obstetrical history is significant for none.  Pregnancy history fully reviewed.  Patient reports no bleeding, no contractions, no cramping and no leaking, no urinary frequency, hesitancy, urgency, or dysuria.  Reports good fm. She does report n/v throughout the pregnancy and has been taking dramamine for relief.  She also reports a h/o fibromyalgia and stopped all of meds when found out about pregnancy- has been taking tylenol w/ minimal relief.    Filed Vitals:   07/13/12 1059  BP: 130/80  Weight: 183 lb (83.008 kg)    HISTORY: OB History   Grav Para Term Preterm Abortions TAB SAB Ect Mult Living   1              # Outc Date GA Lbr Len/2nd Wgt Sex Del Anes PTL Lv   1 CUR              Past Medical History  Diagnosis Date  . Fibromyalgia   . Hx of ovarian cyst   . Hx of hematuria    Past Surgical History  Procedure Laterality Date  . Wisdom tooth extraction Bilateral    Family History  Problem Relation Age of Onset  . Cancer Mother 38    colon  . Diabetes Mother   . Hypertension Mother   . Coronary artery disease Other   . Diabetes Other   . Hypertension Father   . Heart disease Maternal Grandfather      Exam                                      System:     Skin: normal coloration and turgor, no rashes    Neurologic: oriented, normal, normal mood   Extremities: normal strength, tone, and muscle mass   HEENT PERRLA   Mouth/Teeth mucous membranes moist, pharynx normal without lesions   Neck supple and no masses   Cardiovascular: regular rate and rhythm   Respiratory:  appears well, vitals normal, no respiratory distress, acyanotic, normal RR   Abdomen: soft, non-tender; bowel sounds normal; no masses,  no organomegaly   Uterus: FH 24cm   FHR: 140 Had normal pap in 2013- deferred today    Assessment:    Pregnancy: G1P0 Patient Active Problem List  Diagnosis  . Lumbago  . Musculoskeletal pain  . Hx of ovarian cyst  . Hx of hematuria  . Supervision of normal first pregnancy  . Fibromyalgia  . Late prenatal care  . Anxiety    [redacted]w[redacted]d [facility] Late onset of care N/V pregnancy Fibromyalgia Anxiety Marginal placenta previa    Plan:     Initial labs drawn. Problem list reviewed and updated. Genetic Screening discussed: too late. Ultrasound discussed; fetal survey: results reviewed. Pelvic rest d/t marginal placenta previa Rx: prenatal plus 1po daily #30 w/ 12 RF Rx: Diclegis #100, 2 tabs q hs, if sx persist add 1 tab q am on day 3, if sx persist add 1 tab q afternoon starting day 4 Is OK to resume wellbutrin and flexeril for fibromyalgia per [facility] Follow up in 3 weeks for visit.  Marge Duncans 07/13/2012

## 2012-07-13 NOTE — Progress Notes (Signed)
New OB packet given. Consents signed. Needs Rx for prenatal vits.

## 2012-07-14 LAB — CBC
HCT: 37.6 % (ref 36.0–46.0)
MCH: 24.1 pg — ABNORMAL LOW (ref 26.0–34.0)
MCHC: 31.4 g/dL (ref 30.0–36.0)
MCV: 76.7 fL — ABNORMAL LOW (ref 78.0–100.0)
RDW: 14.1 % (ref 11.5–15.5)

## 2012-07-14 LAB — SICKLE CELL SCREEN: Sickle Cell Screen: NEGATIVE

## 2012-07-14 LAB — ABO AND RH: Rh Type: POSITIVE

## 2012-07-14 LAB — URINALYSIS
Glucose, UA: NEGATIVE mg/dL
Hgb urine dipstick: NEGATIVE
Leukocytes, UA: NEGATIVE
Nitrite: NEGATIVE

## 2012-07-14 LAB — DRUG SCREEN, URINE, NO CONFIRMATION
Amphetamine Screen, Ur: NEGATIVE
Barbiturate Quant, Ur: NEGATIVE
Cocaine Metabolites: NEGATIVE
Marijuana Metabolite: NEGATIVE
Opiate Screen, Urine: NEGATIVE

## 2012-07-14 LAB — HIV ANTIBODY (ROUTINE TESTING W REFLEX): HIV: NONREACTIVE

## 2012-07-14 LAB — VARICELLA ZOSTER ANTIBODY, IGG: Varicella IgG: 2839 Index — ABNORMAL HIGH (ref <135.00)

## 2012-07-14 LAB — RUBELLA SCREEN: Rubella: 1.08 Index — ABNORMAL HIGH (ref <0.90)

## 2012-07-15 LAB — URINE CULTURE: Colony Count: NO GROWTH

## 2012-07-27 ENCOUNTER — Ambulatory Visit (INDEPENDENT_AMBULATORY_CARE_PROVIDER_SITE_OTHER): Payer: BC Managed Care – PPO | Admitting: Advanced Practice Midwife

## 2012-07-27 VITALS — BP 104/70 | Wt 186.0 lb

## 2012-07-27 DIAGNOSIS — Z331 Pregnant state, incidental: Secondary | ICD-10-CM

## 2012-07-27 DIAGNOSIS — O0930 Supervision of pregnancy with insufficient antenatal care, unspecified trimester: Secondary | ICD-10-CM

## 2012-07-27 DIAGNOSIS — O99340 Other mental disorders complicating pregnancy, unspecified trimester: Secondary | ICD-10-CM

## 2012-07-27 DIAGNOSIS — O4402 Placenta previa specified as without hemorrhage, second trimester: Secondary | ICD-10-CM

## 2012-07-27 DIAGNOSIS — Z3402 Encounter for supervision of normal first pregnancy, second trimester: Secondary | ICD-10-CM

## 2012-07-27 DIAGNOSIS — Z1389 Encounter for screening for other disorder: Secondary | ICD-10-CM

## 2012-07-27 DIAGNOSIS — O4410 Placenta previa with hemorrhage, unspecified trimester: Secondary | ICD-10-CM

## 2012-07-27 LAB — POCT URINALYSIS DIPSTICK: Ketones, UA: NEGATIVE

## 2012-07-27 NOTE — Progress Notes (Signed)
No c/o at this time.Appetite better!  Routine questions about pregnancy answered.  F/U in 3 weeks for recheck placenta previa, PN2.

## 2012-07-27 NOTE — Progress Notes (Signed)
C/o" lower back pain" 

## 2012-07-27 NOTE — Patient Instructions (Addendum)
Nothing to eat or drink after midnight before your next appointment & plan to be here for 2 hours (for your sugar test).  

## 2012-08-19 ENCOUNTER — Other Ambulatory Visit: Payer: BC Managed Care – PPO

## 2012-08-19 ENCOUNTER — Ambulatory Visit (INDEPENDENT_AMBULATORY_CARE_PROVIDER_SITE_OTHER): Payer: BC Managed Care – PPO

## 2012-08-19 ENCOUNTER — Encounter: Payer: Self-pay | Admitting: Obstetrics & Gynecology

## 2012-08-19 ENCOUNTER — Ambulatory Visit (INDEPENDENT_AMBULATORY_CARE_PROVIDER_SITE_OTHER): Payer: BC Managed Care – PPO | Admitting: Obstetrics & Gynecology

## 2012-08-19 VITALS — BP 120/80 | Wt 189.0 lb

## 2012-08-19 DIAGNOSIS — O0930 Supervision of pregnancy with insufficient antenatal care, unspecified trimester: Secondary | ICD-10-CM

## 2012-08-19 DIAGNOSIS — Z3402 Encounter for supervision of normal first pregnancy, second trimester: Secondary | ICD-10-CM

## 2012-08-19 DIAGNOSIS — O4400 Placenta previa specified as without hemorrhage, unspecified trimester: Secondary | ICD-10-CM

## 2012-08-19 DIAGNOSIS — Z1389 Encounter for screening for other disorder: Secondary | ICD-10-CM

## 2012-08-19 DIAGNOSIS — O4402 Placenta previa specified as without hemorrhage, second trimester: Secondary | ICD-10-CM

## 2012-08-19 DIAGNOSIS — O4410 Placenta previa with hemorrhage, unspecified trimester: Secondary | ICD-10-CM

## 2012-08-19 DIAGNOSIS — Z331 Pregnant state, incidental: Secondary | ICD-10-CM

## 2012-08-19 DIAGNOSIS — O99340 Other mental disorders complicating pregnancy, unspecified trimester: Secondary | ICD-10-CM

## 2012-08-19 DIAGNOSIS — O4403 Placenta previa specified as without hemorrhage, third trimester: Secondary | ICD-10-CM

## 2012-08-19 LAB — POCT URINALYSIS DIPSTICK
Leukocytes, UA: NEGATIVE
Protein, UA: NEGATIVE

## 2012-08-19 LAB — CBC
HCT: 36.7 % (ref 36.0–46.0)
Hemoglobin: 11.6 g/dL — ABNORMAL LOW (ref 12.0–15.0)
MCH: 23.6 pg — ABNORMAL LOW (ref 26.0–34.0)
MCHC: 31.6 g/dL (ref 30.0–36.0)
RBC: 4.92 MIL/uL (ref 3.87–5.11)

## 2012-08-19 NOTE — Progress Notes (Signed)
Sonogram noted, partial previa persists BP weight and urine results all reviewed and noted. Patient reports good fetal movement, denies any bleeding and no rupture of membranes symptoms or regular contractions. Patient is without complaints. All questions were answered. No sex or heavy lifting

## 2012-08-19 NOTE — Progress Notes (Signed)
U/S(28+4wks)-active fetus, meas c/w dates, EFW 2 lb 15 oz (58th%tile)  fluid wnl, post gr 1 plac *Partial Previa remains (vaginal u/s), cx closed, female fetus

## 2012-08-20 LAB — HSV 2 ANTIBODY, IGG: HSV 2 Glycoprotein G Ab, IgG: 0.39 IV

## 2012-08-20 LAB — GLUCOSE TOLERANCE, 2 HOURS W/ 1HR
Glucose, 1 hour: 93 mg/dL (ref 70–170)
Glucose, 2 hour: 79 mg/dL (ref 70–139)

## 2012-08-20 LAB — US OB FOLLOW UP

## 2012-08-26 ENCOUNTER — Telehealth: Payer: Self-pay | Admitting: Obstetrics & Gynecology

## 2012-08-26 NOTE — Telephone Encounter (Signed)
Spoke with pt. Has fibromyalgia and Tylenol is not helping. [redacted] weeks pregnant. Pt has a Rx for Tramadol but has not taken any but wanted to know if she could start. Spoke with Dr. Despina Hidden. Advised can take, but sparingly. Don't take no more than she has to. Pt voiced understanding. JSY

## 2012-09-01 ENCOUNTER — Ambulatory Visit (INDEPENDENT_AMBULATORY_CARE_PROVIDER_SITE_OTHER): Payer: BC Managed Care – PPO | Admitting: Obstetrics and Gynecology

## 2012-09-01 VITALS — BP 108/68 | Wt 191.0 lb

## 2012-09-01 DIAGNOSIS — M797 Fibromyalgia: Secondary | ICD-10-CM

## 2012-09-01 DIAGNOSIS — O4402 Placenta previa specified as without hemorrhage, second trimester: Secondary | ICD-10-CM

## 2012-09-01 DIAGNOSIS — O4400 Placenta previa specified as without hemorrhage, unspecified trimester: Secondary | ICD-10-CM

## 2012-09-01 DIAGNOSIS — O99019 Anemia complicating pregnancy, unspecified trimester: Secondary | ICD-10-CM

## 2012-09-01 DIAGNOSIS — Z1389 Encounter for screening for other disorder: Secondary | ICD-10-CM

## 2012-09-01 LAB — POCT URINALYSIS DIPSTICK
Glucose, UA: NEGATIVE
Leukocytes, UA: NEGATIVE
Nitrite, UA: NEGATIVE

## 2012-09-01 NOTE — Progress Notes (Signed)
No complaints at this time.  Reluctant to consider classes. FOB supportive.  P U/S in 2 wk re: partial PREVIA

## 2012-09-14 ENCOUNTER — Other Ambulatory Visit: Payer: Self-pay | Admitting: Obstetrics & Gynecology

## 2012-09-14 ENCOUNTER — Ambulatory Visit (INDEPENDENT_AMBULATORY_CARE_PROVIDER_SITE_OTHER): Payer: BC Managed Care – PPO

## 2012-09-14 ENCOUNTER — Ambulatory Visit (INDEPENDENT_AMBULATORY_CARE_PROVIDER_SITE_OTHER): Payer: BC Managed Care – PPO | Admitting: Obstetrics & Gynecology

## 2012-09-14 ENCOUNTER — Encounter: Payer: Self-pay | Admitting: Advanced Practice Midwife

## 2012-09-14 ENCOUNTER — Other Ambulatory Visit: Payer: Self-pay | Admitting: Obstetrics and Gynecology

## 2012-09-14 VITALS — BP 108/72 | Wt 193.0 lb

## 2012-09-14 DIAGNOSIS — O4403 Placenta previa specified as without hemorrhage, third trimester: Secondary | ICD-10-CM

## 2012-09-14 DIAGNOSIS — O4402 Placenta previa specified as without hemorrhage, second trimester: Secondary | ICD-10-CM

## 2012-09-14 DIAGNOSIS — Z1389 Encounter for screening for other disorder: Secondary | ICD-10-CM

## 2012-09-14 DIAGNOSIS — Z331 Pregnant state, incidental: Secondary | ICD-10-CM

## 2012-09-14 DIAGNOSIS — O4400 Placenta previa specified as without hemorrhage, unspecified trimester: Secondary | ICD-10-CM

## 2012-09-14 DIAGNOSIS — O0930 Supervision of pregnancy with insufficient antenatal care, unspecified trimester: Secondary | ICD-10-CM

## 2012-09-14 DIAGNOSIS — Z3402 Encounter for supervision of normal first pregnancy, second trimester: Secondary | ICD-10-CM

## 2012-09-14 DIAGNOSIS — O99019 Anemia complicating pregnancy, unspecified trimester: Secondary | ICD-10-CM

## 2012-09-14 LAB — POCT URINALYSIS DIPSTICK
Glucose, UA: NEGATIVE
Nitrite, UA: NEGATIVE
Protein, UA: NEGATIVE

## 2012-09-14 MED ORDER — FLUCONAZOLE 150 MG PO TABS: 150.0000 mg | ORAL_TABLET | Freq: Once | ORAL | Status: DC

## 2012-09-14 NOTE — Progress Notes (Signed)
See u/s report.  Pt counseled about c/s @ 37 weeks, recheck with u/s @ 36 weeks.  Counseled about bleeding.  C/O vaginal itching.  periclitoral condyloma.  Offered TCA and pt declined.  SSE:  + yeast discharge.  Areas of external skin breakdownHSV2 neg. Rx Diflucan.

## 2012-09-14 NOTE — Progress Notes (Signed)
Vaginal swelling and itching,pt states opened areas on labia.

## 2012-09-14 NOTE — Progress Notes (Signed)
U/S(32+2wks)- Vaginal U/S- vtx active fetus, FHR=137BPM, cx long and closed-3.7cm, **Marginal Placenta Post. Previa remains- tip of placenta measures 8-9 mm from internal os

## 2012-09-14 NOTE — Patient Instructions (Signed)
Placenta Previa Placenta previa is a condition in which the placenta has grown low in the womb (uterus). This is a condition in which the organ which connects the fetus to the mother's uterus (placenta) is low in the opening in the uterus (cervix). It can partially or completely cover the cervix. The cause of this is unknown. It is more common with multiple births or twins. SYMPTOMS  The main symptom or sign of placenta previa is vaginal bleeding. The bleeding can be mild to very heavy. This condition can be very serious for the mother and baby. Often there are no symptoms with placenta previa. Sometimes if the location of the placenta is very low it will become partially detached and cause bleeding. This may be simply a marginal sinus separation of the placenta. This is a separation of the vessels from the wall of the uterus. This may cause no further problems other than mild anxiety. There is an increase risk of intrauterine growth restriction (IUGR) with placenta previa because of the abnormal placement of the placenta. DIAGNOSIS  The diagnosis is usually made by ultrasound exam of the uterus. There may be a careful vaginal exam to see the cervix. The patient will be prepared for a Cesarean section immediately if necessary. TREATMENT  Treatment for placenta previa is usually bed rest in the hospital or at home. You may be given medication to stop contractions. Contractions can increase bleeding. Your doctor may take fluid from the baby's sac (amniocentesis) to see if the baby's lungs are mature enough for a C-section. A blood transfusion may be necessary if you have a low blood count. No further treatment may be needed when placenta previa is present in small degrees. Early placenta previa may resolve on it's own. The placenta moves higher in the birth canal as pregnancy progresses. In this case the placenta no longer is an obstruction to birth. The position of the placenta may need to be reconfirmed  during pregnancy. This can be done with an ultrasound exam of the belly(abdomen). Call your caregiver immediately if blood loss is severe. Immediate fluid or blood replacement may be necessary. With complete placenta previa, the only way to safely deliver the baby is by Cesarean section. HOME CARE INSTRUCTIONS   Follow your caregiver's advice about bed rest.  Take any iron pills or other medications your doctor gives to you.  No bending or lifting.  Do not have sexual intercourse.  Do not put anything in your vagina (tampons or vaginal creams). If you are bleeding, use sanitary pads.  Keep your doctors appointments as scheduled. Not keeping the appointment could result in a chronic or permanent injury, pain, disability and injury or death to you or your unborn baby. If there is any problem keeping the appointment, you must call back to this facility for assistance. SEEK IMMEDIATE MEDICAL CARE IF:   You have increased bleeding.  You have fainting episodes or feel lightheaded.  You develop abdominal pain.  You can no longer feel normal fetal or baby movements.  You develop uterine contractions. Document Released: 03/10/2005 Document Revised: 06/02/2011 Document Reviewed: 10/22/2007 [employer] Patient Information 2014 [employer].  

## 2012-09-28 ENCOUNTER — Encounter: Payer: Self-pay | Admitting: Women's Health

## 2012-09-28 ENCOUNTER — Ambulatory Visit (INDEPENDENT_AMBULATORY_CARE_PROVIDER_SITE_OTHER): Payer: BC Managed Care – PPO | Admitting: Women's Health

## 2012-09-28 VITALS — BP 120/80 | Wt 192.4 lb

## 2012-09-28 DIAGNOSIS — O4400 Placenta previa specified as without hemorrhage, unspecified trimester: Secondary | ICD-10-CM

## 2012-09-28 DIAGNOSIS — Z331 Pregnant state, incidental: Secondary | ICD-10-CM

## 2012-09-28 DIAGNOSIS — O99019 Anemia complicating pregnancy, unspecified trimester: Secondary | ICD-10-CM

## 2012-09-28 DIAGNOSIS — O4403 Placenta previa specified as without hemorrhage, third trimester: Secondary | ICD-10-CM

## 2012-09-28 DIAGNOSIS — Z1389 Encounter for screening for other disorder: Secondary | ICD-10-CM

## 2012-09-28 DIAGNOSIS — O0930 Supervision of pregnancy with insufficient antenatal care, unspecified trimester: Secondary | ICD-10-CM

## 2012-09-28 DIAGNOSIS — Z3403 Encounter for supervision of normal first pregnancy, third trimester: Secondary | ICD-10-CM

## 2012-09-28 LAB — POCT URINALYSIS DIPSTICK
Leukocytes, UA: NEGATIVE
Protein, UA: NEGATIVE

## 2012-09-28 NOTE — Patient Instructions (Signed)

## 2012-09-28 NOTE — Progress Notes (Signed)
Reports good fm. Denies uc's, lof, urinary frequency, urgency, hesitancy, or dysuria.  Had 2-3 episodes of small amount red spotting w/ wiping on Friday, none since. Not having sex. Continue pelvic rest, rest as much as possible, notify us/whog if bleeding returns. Reviewed ptl s/s, fetal kick counts, warning s/s to report.  All questions answered. F/U in 2wks for visit and f/u u/s.

## 2012-10-03 ENCOUNTER — Inpatient Hospital Stay (HOSPITAL_COMMUNITY)
Admission: AD | Admit: 2012-10-03 | Discharge: 2012-10-04 | Disposition: A | Discharge status: Home / Self Care | Payer: BC Managed Care – PPO | Source: Ambulatory Visit | Attending: Obstetrics & Gynecology | Admitting: Obstetrics & Gynecology

## 2012-10-03 DIAGNOSIS — O4693 Antepartum hemorrhage, unspecified, third trimester: Secondary | ICD-10-CM

## 2012-10-03 DIAGNOSIS — O26859 Spotting complicating pregnancy, unspecified trimester: Secondary | ICD-10-CM | POA: Insufficient documentation

## 2012-10-03 DIAGNOSIS — O0932 Supervision of pregnancy with insufficient antenatal care, second trimester: Secondary | ICD-10-CM

## 2012-10-03 DIAGNOSIS — Z3403 Encounter for supervision of normal first pregnancy, third trimester: Secondary | ICD-10-CM

## 2012-10-03 DIAGNOSIS — R109 Unspecified abdominal pain: Secondary | ICD-10-CM | POA: Insufficient documentation

## 2012-10-03 NOTE — MAU Note (Signed)
Pt reports pain in lower abd and lower back, states it feels like really bad period cramps. Pain x 6 hours. Reports spotting off/on x 1 week. Pt states she has a "low placenta"

## 2012-10-04 ENCOUNTER — Encounter (HOSPITAL_COMMUNITY): Payer: Self-pay | Admitting: *Deleted

## 2012-10-04 ENCOUNTER — Inpatient Hospital Stay (HOSPITAL_COMMUNITY): Payer: BC Managed Care – PPO

## 2012-10-04 ENCOUNTER — Telehealth: Payer: Self-pay | Admitting: *Deleted

## 2012-10-04 DIAGNOSIS — O26859 Spotting complicating pregnancy, unspecified trimester: Secondary | ICD-10-CM

## 2012-10-04 DIAGNOSIS — R109 Unspecified abdominal pain: Secondary | ICD-10-CM

## 2012-10-04 NOTE — MAU Provider Note (Signed)
History     CSN: [id number]  Arrival date and time: 10/03/12 2341   None     Chief Complaint  Patient presents with  . Vaginal Bleeding  . Abdominal Pain  . Back Pain   HPI This is a 27 y.o. female at [redacted]w[redacted]d who presents with c/o pink spotting since 7/6.  Has had cramps that feel like contractions. Followup US scheduled next week.   RN Note: Pt reports pain in lower abd and lower back, states it feels like really bad period cramps. Pain x 6 hours. Reports spotting off/on x 1 week. Pt states she has a "low placenta"      OB History   Grav Para Term Preterm Abortions TAB SAB Ect Mult Living   1               Past Medical History  Diagnosis Date  . Fibromyalgia   . Hx of ovarian cyst   . Hx of hematuria     Past Surgical History  Procedure Laterality Date  . Wisdom tooth extraction Bilateral     Family History  Problem Relation Age of Onset  . Cancer Mother 44    colon  . Diabetes Mother   . Hypertension Mother   . Coronary artery disease Other   . Diabetes Other   . Hypertension Father   . Heart disease Maternal Grandfather     History  Substance Use Topics  . Smoking status: Never Smoker   . Smokeless tobacco: Not on file  . Alcohol Use: No    Allergies: No Known Allergies  Prescriptions prior to admission  Medication Sig Dispense Refill  . acetaminophen (TYLENOL) 500 MG tablet Take 1,000 mg by mouth 2 (two) times daily.      Marland Kitchen buPROPion (WELLBUTRIN XL) 150 MG 24 hr tablet Take 150 mg by mouth daily.      . cyclobenzaprine (FLEXERIL) 10 MG tablet Take 10 mg by mouth daily.      Marland Kitchen dimenhyDRINATE (DRAMAMINE) 50 MG tablet Take 50 mg by mouth daily.      . Doxylamine-Pyridoxine (DICLEGIS) 10-10 MG TBEC Take 10 mg by mouth See admin instructions.  100 tablet  3  . FLUoxetine (PROZAC) 10 MG tablet Take 20 mg by mouth daily.      . Prenatal Vit-Fe Fumarate-FA (PNV PRENATAL PLUS MULTIVITAMIN) 27-1 MG TABS Take 1 tablet by mouth daily.  30 tablet  12  .  traMADol (ULTRAM) 50 MG tablet Take 50 mg by mouth every 6 (six) hours as needed.        Review of Systems  Constitutional: Negative for fever, chills and malaise/fatigue.  Gastrointestinal: Positive for abdominal pain. Negative for nausea, vomiting, diarrhea and constipation.  Neurological: Negative for dizziness.   Physical Exam   Blood pressure 122/67, pulse 107, temperature 98.3 F (36.8 C), temperature source Oral, resp. rate 20, height 5\' 1"  (1.549 m), weight 86.637 kg (191 lb), last menstrual period 03/12/2012, SpO2 100.00%.  Physical Exam  Constitutional: She is oriented to person, place, and time. She appears well-developed and well-nourished. No distress.  HENT:  Head: Normocephalic.  Cardiovascular: Normal rate.   Respiratory: Effort normal.  GI: Soft. She exhibits no distension. There is no tenderness. There is no rebound and no guarding.  Musculoskeletal: Normal range of motion.  Neurological: She is alert and oriented to person, place, and time.  Skin: Skin is warm and dry.  Psychiatric: She has a normal mood and affect.    MAU  Course  Procedures  MDM Will check [name] to evaluate location of placenta >> Placental edge is 2cm away from os. Cervical length is long at 4.3cm.  Digital exam deferred  Assessment and Plan  A:  [facility] at [redacted]w[redacted]d       Resolved Placenta Previa      Pink spotting, probably due to irritability, no contractions since arrival tonight  P:  Discharge       Call office in AM to inform them of visit here       Precautions reviewed e.g decreased movement, labor, leaking, bleeding  [facility] 10/04/2012, 12:26 AM

## 2012-10-07 NOTE — Telephone Encounter (Signed)
Pt states had went to [facility] due to cramps and vaginal spotting as advised by the after line nurse. Pt states was evaluated at [facility] and told everything WNL. Pt states no more problems since. Pt informed to call office back with any complications or problems. Pt verbalized understanding.

## 2012-10-12 ENCOUNTER — Other Ambulatory Visit: Payer: Self-pay | Admitting: Advanced Practice Midwife

## 2012-10-12 ENCOUNTER — Ambulatory Visit (INDEPENDENT_AMBULATORY_CARE_PROVIDER_SITE_OTHER): Payer: BC Managed Care – PPO

## 2012-10-12 DIAGNOSIS — O4400 Placenta previa specified as without hemorrhage, unspecified trimester: Secondary | ICD-10-CM

## 2012-10-12 DIAGNOSIS — O4403 Placenta previa specified as without hemorrhage, third trimester: Secondary | ICD-10-CM

## 2012-10-12 NOTE — Progress Notes (Signed)
U/S(36+2wks)-vtx active fetus, FHR=149BPM, cx=2.99cm closed, placenta tip noted 2.7cm from internal cx os with vaginal u/s

## 2012-10-14 ENCOUNTER — Ambulatory Visit (INDEPENDENT_AMBULATORY_CARE_PROVIDER_SITE_OTHER): Payer: BC Managed Care – PPO | Admitting: Obstetrics & Gynecology

## 2012-10-14 ENCOUNTER — Encounter: Payer: Self-pay | Admitting: Obstetrics & Gynecology

## 2012-10-14 VITALS — BP 100/80 | Wt 195.5 lb

## 2012-10-14 DIAGNOSIS — Z1389 Encounter for screening for other disorder: Secondary | ICD-10-CM

## 2012-10-14 DIAGNOSIS — O4400 Placenta previa specified as without hemorrhage, unspecified trimester: Secondary | ICD-10-CM

## 2012-10-14 DIAGNOSIS — Z331 Pregnant state, incidental: Secondary | ICD-10-CM

## 2012-10-14 DIAGNOSIS — Z3403 Encounter for supervision of normal first pregnancy, third trimester: Secondary | ICD-10-CM

## 2012-10-14 DIAGNOSIS — O0930 Supervision of pregnancy with insufficient antenatal care, unspecified trimester: Secondary | ICD-10-CM

## 2012-10-14 DIAGNOSIS — O99019 Anemia complicating pregnancy, unspecified trimester: Secondary | ICD-10-CM

## 2012-10-14 LAB — POCT URINALYSIS DIPSTICK
Ketones, UA: NEGATIVE
Protein, UA: NEGATIVE

## 2012-10-14 NOTE — Progress Notes (Signed)
FOR GBS/GC/CHL TODAY. 

## 2012-10-14 NOTE — Patient Instructions (Signed)
Breastfeeding A change in hormones during your pregnancy causes growth of your breast tissue and an increase in number and size of milk ducts. The hormone prolactin allows proteins, sugars, and fats from your blood supply to make breast milk in your milk-producing glands. The hormone progesterone prevents breast milk from being released before the birth of your baby. After the birth of your baby, your progesterone level decreases allowing breast milk to be released. Thoughts of your baby, as well as his or her sucking or crying, can stimulate the release of milk from the milk-producing glands. Deciding to breastfeed (nurse) is one of the best choices you can make for you and your baby. The information that follows gives a brief review of the benefits, as well as other important skills to know about breastfeeding. BENEFITS OF BREASTFEEDING For your baby  The first milk (colostrum) helps your baby's digestive system function better.   There are antibodies in your milk that help your baby fight off infections.   Your baby has a lower incidence of asthma, allergies, and sudden infant death syndrome (SIDS).   The nutrients in breast milk are better for your baby than infant formulas.  Breast milk improves your baby's brain development.   Your baby will have less gas, colic, and constipation.  Your baby is less likely to develop other conditions, such as childhood obesity, asthma, or diabetes mellitus. For you  Breastfeeding helps develop a very special bond between you and your baby.   Breastfeeding is convenient, always available at the correct temperature, and costs nothing.   Breastfeeding helps to burn calories and helps you lose the weight gained during pregnancy.   Breastfeeding makes your uterus contract back down to normal size faster and slows bleeding following delivery.   Breastfeeding mothers have a lower risk of developing osteoporosis or breast or ovarian cancer later  in life.  BREASTFEEDING FREQUENCY  A healthy, full-term baby may breastfeed as often as every hour or space his or her feedings to every 3 hours. Breastfeeding frequency will vary from baby to baby.   Newborns should be fed no less than every 2 3 hours during the day and every 4 5 hours during the night. You should breastfeed a minimum of 8 feedings in a 24 hour period.  Awaken your baby to breastfeed if it has been 3 4 hours since the last feeding.  Breastfeed when you feel the need to reduce the fullness of your breasts or when your newborn shows signs of hunger. Signs that your baby may be hungry include:  Increased alertness or activity.  Stretching.  Movement of the head from side to side.  Movement of the head and opening of the mouth when the corner of the mouth or cheek is stroked (rooting).  Increased sucking sounds, smacking lips, cooing, sighing, or squeaking.  Hand-to-mouth movements.  Increased sucking of fingers or hands.  Fussing.  Intermittent crying.  Signs of extreme hunger will require calming and consoling before you try to feed your baby. Signs of extreme hunger may include:  Restlessness.  A loud, strong cry.  Screaming.  Frequent feeding will help you make more milk and will help prevent problems, such as sore nipples and engorgement of the breasts.  BREASTFEEDING   Whether lying down or sitting, be sure that the baby's abdomen is facing your abdomen.   Support your breast with 4 fingers under your breast and your thumb above your nipple. Make sure your fingers are well away from   your nipple and your baby's mouth.   Stroke your baby's lips gently with your finger or nipple.   When your baby's mouth is open wide enough, place all of your nipple and as much of the colored area around your nipple (areola) as possible into your baby's mouth.  More areola should be visible above his or her upper lip than below his or her lower lip.  Your  baby's tongue should be between his or her lower gum and your breast.  Ensure that your baby's mouth is correctly positioned around the nipple (latched). Your baby's lips should create a seal on your breast.  Signs that your baby has effectively latched onto your nipple include:  Tugging or sucking without pain.  Swallowing heard between sucks.  Absent click or smacking sound.  Muscle movement above and in front of his or her ears with sucking.  Your baby must suck about 2 3 minutes in order to get your milk. Allow your baby to feed on each breast as long as he or she wants. Nurse your baby until he or she unlatches or falls asleep at the first breast, then offer the second breast.  Signs that your baby is full and satisfied include:  A gradual decrease in the number of sucks or complete cessation of sucking.  Falling asleep.  Extension or relaxation of his or her body.  Retention of a small amount of milk in his or her mouth.  Letting go of your breast by himself or herself.  Signs of effective breastfeeding in you include:  Breasts that have increased firmness, weight, and size prior to feeding.  Breasts that are softer after nursing.  Increased milk volume, as well as a change in milk consistency and color by the 5th day of breastfeeding.  Breast fullness relieved by breastfeeding.  Nipples are not sore, cracked, or bleeding.  If needed, break the suction by putting your finger into the corner of your baby's mouth and sliding your finger between his or her gums. Then, remove your breast from his or her mouth.  It is common for babies to spit up a small amount after a feeding.  Babies often swallow air during feeding. This can make babies fussy. Burping your baby between breasts can help with this.  Vitamin D supplements are recommended for babies who get only breast milk.  Avoid using a pacifier during your baby's first 4 6 weeks.  Avoid supplemental feedings of  water, formula, or juice in place of breastfeeding. Breast milk is all the food your baby needs. It is not necessary for your baby to have water or formula. Your breasts will make more milk if supplemental feedings are avoided during the early weeks. HOW TO TELL WHETHER YOUR BABY IS GETTING ENOUGH BREAST MILK Wondering whether or not your baby is getting enough milk is a common concern among mothers. You can be assured that your baby is getting enough milk if:   Your baby is actively sucking and you hear swallowing.   Your baby seems relaxed and satisfied after a feeding.   Your baby nurses at least 8 12 times in a 24 hour time period.  During the first 3 5 days of age:  Your baby is wetting at least 3 5 diapers in a 24 hour period. The urine should be clear and pale yellow.  Your baby is having at least 3 4 stools in a 24 hour period. The stool should be soft and yellow.  At   5 7 days of age, your baby is having at least 3 6 stools in a 24 hour period. The stool should be seedy and yellow by 5 days of age.  Your baby has a weight loss less than 7 10% during the first 3 days of age.  Your baby does not lose weight after 3 7 days of age.  Your baby gains 4 7 ounces each week after he or she is 4 days of age.  Your baby gains weight by 5 days of age and is back to birth weight within 2 weeks. ENGORGEMENT In the first week after your baby is born, you may experience extremely full breasts (engorgement). When engorged, your breasts may feel heavy, warm, or tender to the touch. Engorgement peaks within 24 48 hours after delivery of your baby.  Engorgement may be reduced by:  Continuing to breastfeed.  Increasing the frequency of breastfeeding.  Taking warm showers or applying warm, moist heat to your breasts just before each feeding. This increases circulation and helps the milk flow.   Gently massaging your breast before and during the feedings. With your fingertips, massage from  your chest wall towards your nipple in a circular motion.   Ensuring that your baby empties at least one breast at every feeding. It also helps to start the next feeding on the opposite breast.   Expressing breast milk by hand or by using a breast pump to empty the breasts if your baby is sleepy, or not nursing well. You may also want to express milk if you are returning to work oryou feel you are getting engorged.  Ensuring your baby is latched on and positioned properly while breastfeeding. If you follow these suggestions, your engorgement should improve in 24 48 hours. If you are still experiencing difficulty, call your lactation consultant or caregiver.  CARING FOR YOURSELF Take care of your breasts.  Bathe or shower daily.   Avoid using soap on your nipples.   Wear a supportive bra. Avoid wearing underwire style bras.  Air dry your nipples for a 3 4minutes after each feeding.   Use only cotton bra pads to absorb breast milk leakage. Leaking of breast milk between feedings is normal.   Use only pure lanolin on your nipples after nursing. You do not need to wash it off before feeding your baby again. Another option is to express a few drops of breast milk and gently massage that milk into your nipples.  Continue breast self-awareness checks. Take care of yourself.  Eat healthy foods. Alternate 3 meals with 3 snacks.  Avoid foods that you notice affect your baby in a bad way.  Drink milk, fruit juice, and water to satisfy your thirst (about 8 glasses a day).   Rest often, relax, and take your prenatal vitamins to prevent fatigue, stress, and anemia.  Avoid chewing and smoking tobacco.  Avoid alcohol and drug use.  Take over-the-counter and prescribed medicine only as directed by your caregiver or pharmacist. You should always check with your caregiver or pharmacist before taking any new medicine, vitamin, or herbal supplement.  Know that pregnancy is possible while  breastfeeding. If desired, talk to your caregiver about family planning and safe birth control methods that may be used while breastfeeding. SEEK MEDICAL CARE IF:   You feel like you want to stop breastfeeding or have become frustrated with breastfeeding.  You have painful breasts or nipples.  Your nipples are cracked or bleeding.  Your breasts are red, tender,   or warm.  You have a swollen area on either breast.  You have a fever or chills.  You have nausea or vomiting.  You have drainage from your nipples.  Your breasts do not become full before feedings by the 5th day after delivery.  You feel sad and depressed.  Your baby is too sleepy to eat well.  Your baby is having trouble sleeping.   Your baby is wetting less than 3 diapers in a 24 hour period.  Your baby has less than 3 stools in a 24 hour period.  Your baby's skin or the white part of his or her eyes becomes more yellow.   Your baby is not gaining weight by 5 days of age. MAKE SURE YOU:   Understand these instructions.  Will watch your condition.  Will get help right away if you are not doing well or get worse. Document Released: 03/10/2005 Document Revised: 12/03/2011 Document Reviewed: 10/15/2011 [employer] Patient Information 2014 [employer].  

## 2012-10-14 NOTE — Progress Notes (Signed)
Placenta now 2.7 cm away from cervix, so OK for vaginal delivery BP weight and urine results all reviewed and noted. Patient reports good fetal movement, denies any bleeding and no rupture of membranes symptoms or regular contractions. Patient is without complaints. All questions were answered.

## 2012-10-17 LAB — STREP B DNA PROBE: GBSP: POSITIVE

## 2012-10-21 ENCOUNTER — Ambulatory Visit (INDEPENDENT_AMBULATORY_CARE_PROVIDER_SITE_OTHER): Payer: BC Managed Care – PPO | Admitting: Women's Health

## 2012-10-21 ENCOUNTER — Encounter: Payer: Self-pay | Admitting: Women's Health

## 2012-10-21 VITALS — BP 120/70 | Wt 194.2 lb

## 2012-10-21 DIAGNOSIS — Z1389 Encounter for screening for other disorder: Secondary | ICD-10-CM

## 2012-10-21 DIAGNOSIS — A630 Anogenital (venereal) warts: Secondary | ICD-10-CM | POA: Insufficient documentation

## 2012-10-21 DIAGNOSIS — O0930 Supervision of pregnancy with insufficient antenatal care, unspecified trimester: Secondary | ICD-10-CM

## 2012-10-21 DIAGNOSIS — O99019 Anemia complicating pregnancy, unspecified trimester: Secondary | ICD-10-CM

## 2012-10-21 DIAGNOSIS — Z3400 Encounter for supervision of normal first pregnancy, unspecified trimester: Secondary | ICD-10-CM

## 2012-10-21 DIAGNOSIS — O0932 Supervision of pregnancy with insufficient antenatal care, second trimester: Secondary | ICD-10-CM

## 2012-10-21 DIAGNOSIS — Z331 Pregnant state, incidental: Secondary | ICD-10-CM

## 2012-10-21 HISTORY — DX: Anogenital (venereal) warts: A63.0

## 2012-10-21 NOTE — Progress Notes (Signed)
Reports good fm. Denies uc's, lof, vb, urinary frequency, urgency, hesitancy, or dysuria.  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  Reviewed labor s/s, fetal kick counts.  All questions answered. F/U in 1wk for visit.

## 2012-10-21 NOTE — Progress Notes (Signed)
Pt c/o "irritation with wart on vaginal area"

## 2012-10-21 NOTE — Patient Instructions (Addendum)

## 2012-10-27 ENCOUNTER — Ambulatory Visit (INDEPENDENT_AMBULATORY_CARE_PROVIDER_SITE_OTHER): Payer: BC Managed Care – PPO | Admitting: Obstetrics & Gynecology

## 2012-10-27 ENCOUNTER — Encounter: Payer: Self-pay | Admitting: Obstetrics & Gynecology

## 2012-10-27 VITALS — BP 120/70 | Wt 196.0 lb

## 2012-10-27 DIAGNOSIS — O9989 Other specified diseases and conditions complicating pregnancy, childbirth and the puerperium: Secondary | ICD-10-CM

## 2012-10-27 DIAGNOSIS — O99340 Other mental disorders complicating pregnancy, unspecified trimester: Secondary | ICD-10-CM

## 2012-10-27 DIAGNOSIS — O0930 Supervision of pregnancy with insufficient antenatal care, unspecified trimester: Secondary | ICD-10-CM

## 2012-10-27 DIAGNOSIS — Z3403 Encounter for supervision of normal first pregnancy, third trimester: Secondary | ICD-10-CM

## 2012-10-27 DIAGNOSIS — O4400 Placenta previa specified as without hemorrhage, unspecified trimester: Secondary | ICD-10-CM

## 2012-10-27 DIAGNOSIS — O99019 Anemia complicating pregnancy, unspecified trimester: Secondary | ICD-10-CM

## 2012-10-27 DIAGNOSIS — O210 Mild hyperemesis gravidarum: Secondary | ICD-10-CM

## 2012-10-27 DIAGNOSIS — O99891 Other specified diseases and conditions complicating pregnancy: Secondary | ICD-10-CM

## 2012-10-27 DIAGNOSIS — Z331 Pregnant state, incidental: Secondary | ICD-10-CM

## 2012-10-27 DIAGNOSIS — Z1389 Encounter for screening for other disorder: Secondary | ICD-10-CM

## 2012-10-27 LAB — POCT URINALYSIS DIPSTICK
Ketones, UA: NEGATIVE
Protein, UA: NEGATIVE

## 2012-10-27 NOTE — Patient Instructions (Signed)

## 2012-10-27 NOTE — Progress Notes (Signed)
BP weight and urine results all reviewed and noted. Patient reports good fetal movement, denies any bleeding and no rupture of membranes symptoms or regular contractions. Patient is without complaints. All questions were answered.  

## 2012-10-28 ENCOUNTER — Inpatient Hospital Stay (HOSPITAL_COMMUNITY)
Admission: AD | Admit: 2012-10-28 | Discharge: 2012-10-28 | Disposition: A | Discharge status: Home / Self Care | Payer: BC Managed Care – PPO | Source: Ambulatory Visit | Attending: Family Medicine | Admitting: Family Medicine

## 2012-10-28 ENCOUNTER — Encounter (HOSPITAL_COMMUNITY): Payer: Self-pay | Admitting: *Deleted

## 2012-10-28 DIAGNOSIS — O479 False labor, unspecified: Secondary | ICD-10-CM | POA: Insufficient documentation

## 2012-10-28 DIAGNOSIS — Z3403 Encounter for supervision of normal first pregnancy, third trimester: Secondary | ICD-10-CM

## 2012-10-28 DIAGNOSIS — O0930 Supervision of pregnancy with insufficient antenatal care, unspecified trimester: Secondary | ICD-10-CM

## 2012-10-28 NOTE — MAU Note (Signed)
PT SAYS SHE HURTS BAD  SINCE 6PM.   VE [facility]  YESTERDAY-    2 CM.   DENIES HSV AND MRSA.

## 2012-10-28 NOTE — MAU Note (Signed)
Contractions, denies bleeding or ROM 

## 2012-10-29 ENCOUNTER — Inpatient Hospital Stay (HOSPITAL_COMMUNITY): Payer: BC Managed Care – PPO | Admitting: Anesthesiology

## 2012-10-29 ENCOUNTER — Encounter (HOSPITAL_COMMUNITY): Payer: Self-pay | Admitting: Anesthesiology

## 2012-10-29 ENCOUNTER — Encounter (HOSPITAL_COMMUNITY): Payer: Self-pay | Admitting: *Deleted

## 2012-10-29 ENCOUNTER — Inpatient Hospital Stay (HOSPITAL_COMMUNITY)
Admission: AD | Admit: 2012-10-29 | Discharge: 2012-10-31 | DRG: 372 | Disposition: A | Discharge status: Home / Self Care | Payer: BC Managed Care – PPO | Source: Ambulatory Visit | Attending: Obstetrics and Gynecology | Admitting: Obstetrics and Gynecology

## 2012-10-29 DIAGNOSIS — O99892 Other specified diseases and conditions complicating childbirth: Secondary | ICD-10-CM

## 2012-10-29 DIAGNOSIS — O4290 Premature rupture of membranes, unspecified as to length of time between rupture and onset of labor, unspecified weeks of gestation: Principal | ICD-10-CM | POA: Diagnosis present

## 2012-10-29 DIAGNOSIS — O0930 Supervision of pregnancy with insufficient antenatal care, unspecified trimester: Secondary | ICD-10-CM

## 2012-10-29 DIAGNOSIS — O9989 Other specified diseases and conditions complicating pregnancy, childbirth and the puerperium: Secondary | ICD-10-CM

## 2012-10-29 DIAGNOSIS — Z22330 Carrier of Group B streptococcus: Secondary | ICD-10-CM

## 2012-10-29 DIAGNOSIS — Z3403 Encounter for supervision of normal first pregnancy, third trimester: Secondary | ICD-10-CM

## 2012-10-29 LAB — TYPE AND SCREEN
ABO/RH(D): B POS
Antibody Screen: NEGATIVE

## 2012-10-29 LAB — CBC
HCT: 36.9 % (ref 36.0–46.0)
MCH: 23.8 pg — ABNORMAL LOW (ref 26.0–34.0)
MCV: 71.5 fL — ABNORMAL LOW (ref 78.0–100.0)
RDW: 15.2 % (ref 11.5–15.5)
WBC: 10.6 10*3/uL — ABNORMAL HIGH (ref 4.0–10.5)

## 2012-10-29 MED ORDER — PENICILLIN G POTASSIUM 5000000 UNITS IJ SOLR
5.0000 10*6.[IU] | Freq: Once | INTRAVENOUS | Status: AC
  Administered 2012-10-29: 5 10*6.[IU] via INTRAVENOUS
  Filled 2012-10-29: qty 5

## 2012-10-29 MED ORDER — WITCH HAZEL-GLYCERIN EX PADS: 1.0000 "application " | MEDICATED_PAD | CUTANEOUS | Status: DC | PRN

## 2012-10-29 MED ORDER — ONDANSETRON HCL 4 MG/2ML IJ SOLN: 4.0000 mg | INTRAMUSCULAR | Status: DC | PRN

## 2012-10-29 MED ORDER — SENNOSIDES-DOCUSATE SODIUM 8.6-50 MG PO TABS
2.0000 | ORAL_TABLET | Freq: Every day | ORAL | Status: DC
  Administered 2012-10-29 – 2012-10-30 (×2): 2 via ORAL

## 2012-10-29 MED ORDER — PHENYLEPHRINE 40 MCG/ML (10ML) SYRINGE FOR IV PUSH (FOR BLOOD PRESSURE SUPPORT)
80.0000 ug | PREFILLED_SYRINGE | INTRAVENOUS | Status: DC | PRN
  Filled 2012-10-29: qty 5
  Filled 2012-10-29: qty 2

## 2012-10-29 MED ORDER — FENTANYL 2.5 MCG/ML BUPIVACAINE 1/10 % EPIDURAL INFUSION (WH - ANES)
INTRAMUSCULAR | Status: DC | PRN
  Administered 2012-10-29: 14 mL/h via EPIDURAL

## 2012-10-29 MED ORDER — EPHEDRINE 5 MG/ML INJ
10.0000 mg | INTRAVENOUS | Status: DC | PRN
  Filled 2012-10-29: qty 2
  Filled 2012-10-29: qty 4

## 2012-10-29 MED ORDER — LACTATED RINGERS IV SOLN
INTRAVENOUS | Status: DC
  Administered 2012-10-29 (×3): via INTRAVENOUS

## 2012-10-29 MED ORDER — TETANUS-DIPHTH-ACELL PERTUSSIS 5-2.5-18.5 LF-MCG/0.5 IM SUSP
0.5000 mL | Freq: Once | INTRAMUSCULAR | Status: AC
  Administered 2012-10-30: 0.5 mL via INTRAMUSCULAR

## 2012-10-29 MED ORDER — OXYCODONE-ACETAMINOPHEN 5-325 MG PO TABS: 1.0000 | ORAL_TABLET | ORAL | Status: DC | PRN

## 2012-10-29 MED ORDER — FENTANYL CITRATE 0.05 MG/ML IJ SOLN: 100.0000 ug | INTRAMUSCULAR | Status: DC | PRN

## 2012-10-29 MED ORDER — ONDANSETRON HCL 4 MG PO TABS: 4.0000 mg | ORAL_TABLET | ORAL | Status: DC | PRN

## 2012-10-29 MED ORDER — OXYTOCIN BOLUS FROM INFUSION: 500.0000 mL | INTRAVENOUS | Status: DC

## 2012-10-29 MED ORDER — ZOLPIDEM TARTRATE 5 MG PO TABS: 5.0000 mg | ORAL_TABLET | Freq: Every evening | ORAL | Status: DC | PRN

## 2012-10-29 MED ORDER — OXYTOCIN 40 UNITS IN LACTATED RINGERS INFUSION - SIMPLE MED
62.5000 mL/h | INTRAVENOUS | Status: DC
  Administered 2012-10-29: 62.5 mL/h via INTRAVENOUS
  Filled 2012-10-29: qty 1000

## 2012-10-29 MED ORDER — DIPHENHYDRAMINE HCL 25 MG PO CAPS: 25.0000 mg | ORAL_CAPSULE | Freq: Four times a day (QID) | ORAL | Status: DC | PRN

## 2012-10-29 MED ORDER — PENICILLIN G POTASSIUM 5000000 UNITS IJ SOLR
2.5000 10*6.[IU] | INTRAVENOUS | Status: DC
  Administered 2012-10-29 (×2): 2.5 10*6.[IU] via INTRAVENOUS
  Filled 2012-10-29 (×6): qty 2.5

## 2012-10-29 MED ORDER — IBUPROFEN 600 MG PO TABS
600.0000 mg | ORAL_TABLET | Freq: Four times a day (QID) | ORAL | Status: DC | PRN
  Administered 2012-10-29: 600 mg via ORAL
  Filled 2012-10-29: qty 1

## 2012-10-29 MED ORDER — BENZOCAINE-MENTHOL 20-0.5 % EX AERO: 1.0000 "application " | INHALATION_SPRAY | CUTANEOUS | Status: DC | PRN

## 2012-10-29 MED ORDER — OXYTOCIN 40 UNITS IN LACTATED RINGERS INFUSION - SIMPLE MED
1.0000 m[IU]/min | INTRAVENOUS | Status: DC
  Administered 2012-10-29: 2 m[IU]/min via INTRAVENOUS

## 2012-10-29 MED ORDER — IBUPROFEN 600 MG PO TABS
600.0000 mg | ORAL_TABLET | Freq: Four times a day (QID) | ORAL | Status: DC
  Administered 2012-10-30 – 2012-10-31 (×7): 600 mg via ORAL
  Filled 2012-10-29 (×7): qty 1

## 2012-10-29 MED ORDER — TERBUTALINE SULFATE 1 MG/ML IJ SOLN: 0.2500 mg | Freq: Once | INTRAMUSCULAR | Status: DC | PRN

## 2012-10-29 MED ORDER — LIDOCAINE HCL (PF) 1 % IJ SOLN
INTRAMUSCULAR | Status: DC | PRN
  Administered 2012-10-29 (×2): 9 mL

## 2012-10-29 MED ORDER — LANOLIN HYDROUS EX OINT: TOPICAL_OINTMENT | CUTANEOUS | Status: DC | PRN

## 2012-10-29 MED ORDER — PRENATAL MULTIVITAMIN CH
1.0000 | ORAL_TABLET | Freq: Every day | ORAL | Status: DC
  Administered 2012-10-30 – 2012-10-31 (×2): 1 via ORAL
  Filled 2012-10-29 (×2): qty 1

## 2012-10-29 MED ORDER — SIMETHICONE 80 MG PO CHEW: 80.0000 mg | CHEWABLE_TABLET | ORAL | Status: DC | PRN

## 2012-10-29 MED ORDER — DIPHENHYDRAMINE HCL 50 MG/ML IJ SOLN: 12.5000 mg | INTRAMUSCULAR | Status: DC | PRN

## 2012-10-29 MED ORDER — DIBUCAINE 1 % RE OINT: 1.0000 "application " | TOPICAL_OINTMENT | RECTAL | Status: DC | PRN

## 2012-10-29 MED ORDER — ACETAMINOPHEN 325 MG PO TABS: 650.0000 mg | ORAL_TABLET | ORAL | Status: DC | PRN

## 2012-10-29 MED ORDER — CITRIC ACID-SODIUM CITRATE 334-500 MG/5ML PO SOLN: 30.0000 mL | ORAL | Status: DC | PRN

## 2012-10-29 MED ORDER — OXYCODONE-ACETAMINOPHEN 5-325 MG PO TABS
1.0000 | ORAL_TABLET | ORAL | Status: DC | PRN
Start: 2012-10-29 — End: 2012-10-31
  Administered 2012-10-30 – 2012-10-31 (×4): 1 via ORAL
  Administered 2012-10-31: 2 via ORAL
  Filled 2012-10-29 (×3): qty 1
  Filled 2012-10-29: qty 2
  Filled 2012-10-29: qty 1

## 2012-10-29 MED ORDER — FENTANYL 2.5 MCG/ML BUPIVACAINE 1/10 % EPIDURAL INFUSION (WH - ANES)
14.0000 mL/h | INTRAMUSCULAR | Status: DC | PRN
  Administered 2012-10-29: 14 mL/h via EPIDURAL
  Filled 2012-10-29 (×2): qty 125

## 2012-10-29 MED ORDER — PHENYLEPHRINE 40 MCG/ML (10ML) SYRINGE FOR IV PUSH (FOR BLOOD PRESSURE SUPPORT)
80.0000 ug | PREFILLED_SYRINGE | INTRAVENOUS | Status: DC | PRN
  Filled 2012-10-29: qty 2

## 2012-10-29 MED ORDER — LACTATED RINGERS IV SOLN: 500.0000 mL | INTRAVENOUS | Status: DC | PRN

## 2012-10-29 MED ORDER — ONDANSETRON HCL 4 MG/2ML IJ SOLN
4.0000 mg | Freq: Four times a day (QID) | INTRAMUSCULAR | Status: DC | PRN
  Administered 2012-10-29: 4 mg via INTRAVENOUS
  Filled 2012-10-29: qty 2

## 2012-10-29 MED ORDER — LIDOCAINE HCL (PF) 1 % IJ SOLN
30.0000 mL | INTRAMUSCULAR | Status: DC | PRN
  Filled 2012-10-29 (×2): qty 30

## 2012-10-29 MED ORDER — EPHEDRINE 5 MG/ML INJ
10.0000 mg | INTRAVENOUS | Status: DC | PRN
  Filled 2012-10-29: qty 2

## 2012-10-29 MED ORDER — LACTATED RINGERS IV SOLN
500.0000 mL | Freq: Once | INTRAVENOUS | Status: AC
  Administered 2012-10-29: 500 mL via INTRAVENOUS

## 2012-10-29 NOTE — Progress Notes (Signed)
Theresa Rosario is a 27 y.o. G1P0 at [redacted]w[redacted]d admitted for rupture of membranes  Subjective: Patient comfortable on epidural. Can't move legs, does not feel contractions or pressure  Objective: BP 92/49  Pulse 76  Temp(Src) 98.3 F (36.8 C) (Oral)  Resp 20  Ht 5\' 1"  (1.549 m)  Wt 87.544 kg (193 lb)  BMI 36.49 kg/m2  SpO2 100%  LMP 03/12/2012      FHT:  FHR: 120 bpm, variability: moderate,  accelerations:  Present,  decelerations:  Absent UC:   regular, every 2 minutes SVE:   Dilation: Lip/rim Effacement (%): 100 Station: +2 Exam by:: Tressia Danas RN  Labs: Lab Results  Component Value Date   WBC 10.6* 10/29/2012   HGB 12.3 10/29/2012   HCT 36.9 10/29/2012   MCV 71.5* 10/29/2012   PLT 269 10/29/2012    Assessment / Plan: Augmentation of labor, progressing well Will re-check in an hour or sooner if patient starts to feel pressure. Trial of pushes at that time.  Labor: Progressing on Pitocin Fetal Wellbeing:  Category I Pain Control:  Epidural I/D:  penicillin Anticipated MOD:  NSVD  Tawni Carnes 10/29/2012, 4:06 PM

## 2012-10-29 NOTE — Progress Notes (Signed)
Theresa Rosario is a 27 y.o. G1P0 at [redacted]w[redacted]d admitted for rupture of membranes  Subjective: Patient comfortable with epidural  Objective: BP 103/52  Pulse 68  Temp(Src) 97.9 F (36.6 C) (Oral)  Resp 18  Ht 5\' 1"  (1.549 m)  Wt 87.544 kg (193 lb)  BMI 36.49 kg/m2  SpO2 100%  LMP 03/12/2012      FHT:  FHR: 135 bpm, variability: moderate,  accelerations:  Present,  decelerations:  Absent UC:   regular, every 3 minutes SVE:   Dilation: 5 Effacement (%): 90 Station: -2 Exam by:: Tressia Danas RN  Labs: Lab Results  Component Value Date   WBC 10.6* 10/29/2012   HGB 12.3 10/29/2012   HCT 36.9 10/29/2012   MCV 71.5* 10/29/2012   PLT 269 10/29/2012    Assessment / Plan: Augmentation of labor, progressing well Starting pit 2x2  Labor: failure to progress, augment with pit Fetal Wellbeing:  Category I Pain Control:  Epidural I/D:  penicillin Anticipated MOD:  NSVD  Tawni Carnes 10/29/2012, 11:28 AM

## 2012-10-29 NOTE — MAU Note (Signed)
Pt reports ROM at 0245, contractions

## 2012-10-29 NOTE — Anesthesia Procedure Notes (Signed)
Epidural Patient location during procedure: OB Start time: 10/29/2012 5:07 AM End time: 10/29/2012 5:12 AM  Staffing Anesthesiologist: Sandrea Hughs Performed by: anesthesiologist   Preanesthetic Checklist Completed: patient identified, surgical consent, pre-op evaluation, timeout performed, IV checked, risks and benefits discussed and monitors and equipment checked  Epidural Patient position: sitting Prep: site prepped and draped and DuraPrep Patient monitoring: continuous pulse ox and blood pressure Approach: midline Injection technique: LOR air  Needle:  Needle type: Tuohy  Needle gauge: 17 G Needle length: 9 cm and 9 Needle insertion depth: 6 cm Catheter type: closed end flexible Catheter size: 19 Gauge Catheter at skin depth: 11 cm Test dose: negative and Other  Assessment Sensory level: T9 Events: blood not aspirated, injection not painful, no injection resistance, negative IV test and no paresthesia  Additional Notes Reason for block:procedure for pain

## 2012-10-29 NOTE — H&P (Signed)
Theresa Rosario is a 27 y.o. female G1P0 at 36.5wks by 21wk U/S presenting for leaking fluid since 84. She was seen in MAU earlier this evening for a labor eval and was d/c home at 2cm.  Denies bldg, H/A, N/V/D. Her preg has been followed by the [facility] service and has been remarkable for 1) late to care at 21wks 2) fibromyalgia 3) HPV 4) marginal placenta previa- resolved by 36wks History OB History   Grav Para Term Preterm Abortions TAB SAB Ect Mult Living   1              Past Medical History  Diagnosis Date  . Fibromyalgia   . Hx of ovarian cyst   . Hx of hematuria    Past Surgical History  Procedure Laterality Date  . Wisdom tooth extraction Bilateral    Family History: family history includes Cancer (age of onset: 54) in her mother; Coronary artery disease in her other; Diabetes in her mother and other; Heart disease in her maternal grandfather; and Hypertension in her father and mother. Social History:  reports that she has never smoked. She does not have any smokeless tobacco history on file. She reports that she does not drink alcohol or use illicit drugs.   Prenatal Transfer Tool  Maternal Diabetes: No Genetic Screening: Declined- too late Maternal Ultrasounds/Referrals: Normal Fetal Ultrasounds or other Referrals:  None Maternal Substance Abuse:  No Significant Maternal Medications:  Meds include: Other: Tramadol one qd for fibromyalgia Significant Maternal Lab Results:  Lab values include: Group B Strep positive Other Comments:  None  ROS  Dilation: 3 Effacement (%): 90 Station: -1 Exam by:: Weston,RN Blood pressure 134/79, pulse 90, temperature 98.6 F (37 C), temperature source Oral, resp. rate 20, height 5\' 1"  (1.549 m), weight 87.544 kg (193 lb), last menstrual period 03/12/2012, SpO2 100.00%. Maternal Exam:  Uterine Assessment: Ctx q 4-5 min     Fetal Exam Fetal Monitor Review: Baseline rate: 130s.  Variability: moderate (6-25 bpm).    Pattern: accelerations present and no decelerations.       Physical Exam  Constitutional: She is oriented to person, place, and time. She appears well-developed.  HENT:  Head: Normocephalic.  Cardiovascular: Normal rate.   Respiratory: Effort normal.  Musculoskeletal: Normal range of motion.  Neurological: She is alert and oriented to person, place, and time.  Skin: Skin is warm and dry.  Psychiatric: She has a normal mood and affect. Her behavior is normal. Thought content normal.    Prenatal labs: ABO, Rh: B/POS/-- (04/22 1200) Antibody: NEG (05/29 0921) Rubella: 1.08 (04/22 1200) RPR: NON REAC (05/29 0921)  HBsAg: NEGATIVE (04/22 1200)  HIV: NON REACTIVE (05/29 0921)  GBS: POSITIVE (07/24 1102)   Assessment/Plan: IUP at 38.5wks PROM/early labor GBS pos  Admit to [facility] Expectant management Anticipate SVD PCN for GBS prophylaxis   SHAW, KIMBERLY 10/29/2012, 5:09 AM

## 2012-10-29 NOTE — Progress Notes (Signed)
Delivery of live viable female by Dr Waynetta Sandy, assisted by Dr Deloria Lair 9,9

## 2012-10-29 NOTE — H&P (Signed)
Chart reviewed and agree with management and plan.  

## 2012-10-29 NOTE — Progress Notes (Signed)
Theresa Rosario is a 27 y.o. G1P0 at [redacted]w[redacted]d admitted for rupture of membranes  Subjective: Patient comfortable on epidural, cannot feel contractions.  Objective: BP 111/76  Pulse 93  Temp(Src) 98.3 F (36.8 C) (Oral)  Resp 18  Ht 5\' 1"  (1.549 m)  Wt 87.544 kg (193 lb)  BMI 36.49 kg/m2  SpO2 100%  LMP 03/12/2012      FHT:  FHR: 125 bpm, variability: moderate,  accelerations:  Present,  decelerations:  Absent UC:   regular, every 2-3 minutes SVE:   Dilation: 7 Effacement (%): 100 Station: -1 Exam by:: Tressia Danas RN  Labs: Lab Results  Component Value Date   WBC 10.6* 10/29/2012   HGB 12.3 10/29/2012   HCT 36.9 10/29/2012   MCV 71.5* 10/29/2012   PLT 269 10/29/2012    Assessment / Plan: Augmentation of labor, progressing well  Labor: Progressing on Pitocin Fetal Wellbeing:  Category I Pain Control:  Epidural I/D:  penicillin Anticipated MOD:  NSVD  Tawni Carnes 10/29/2012, 2:18 PM

## 2012-10-29 NOTE — Anesthesia Preprocedure Evaluation (Signed)
Anesthesia Evaluation  Patient identified by MRN, date of birth, ID band Patient awake    Reviewed: Allergy & Precautions, H&P , NPO status , Patient's Chart, lab work & pertinent test results  Airway Mallampati: II TM Distance: >3 FB Neck ROM: full    Dental no notable dental hx.    Pulmonary neg pulmonary ROS,    Pulmonary exam normal       Cardiovascular negative cardio ROS  - Systolic murmurs    Neuro/Psych    GI/Hepatic negative GI ROS, Neg liver ROS,   Endo/Other  negative endocrine ROS  Renal/GU negative Renal ROS     Musculoskeletal  (+) Fibromyalgia -  Abdominal Normal abdominal exam  (+)   Peds  Hematology negative hematology ROS (+)   Anesthesia Other Findings   Reproductive/Obstetrics (+) Pregnancy                           Anesthesia Physical Anesthesia Plan  ASA: II  Anesthesia Plan: Epidural   Post-op Pain Management:    Induction:   Airway Management Planned:   Additional Equipment:   Intra-op Plan:   Post-operative Plan:   Informed Consent: I have reviewed the patients History and Physical, chart, labs and discussed the procedure including the risks, benefits and alternatives for the proposed anesthesia with the patient or authorized representative who has indicated his/her understanding and acceptance.     Plan Discussed with:   Anesthesia Plan Comments:         Anesthesia Quick Evaluation

## 2012-10-30 NOTE — Clinical Social Work Note (Signed)
CSW noted medication management for MOB.  No emotional concerns at this time.  Patient was referred for history of depression/anxiety.  * Referral screened out by Clinical Social Worker because none of the following criteria appear to apply: ~ History of anxiety/depression during this pregnancy, or of post-partum depression. ~ Diagnosis of anxiety and/or depression within last 3 years ~ History of depression due to pregnancy loss/loss of child  OR  * Patient's symptoms currently being treated with medication and/or therapy.  Please contact the Clinical Social Worker if needs arise, or by the patient's request. [phone]

## 2012-10-30 NOTE — Anesthesia Postprocedure Evaluation (Signed)
  Anesthesia Post-op Note  Patient: Theresa Rosario  Procedure(s) Performed: * No procedures listed *  Patient Location: PACU and Mother/Baby  Anesthesia Type:Epidural  Level of Consciousness: awake, alert  and oriented  Airway and Oxygen Therapy: Patient Spontanous Breathing  Post-op Pain: mild  Post-op Assessment: Patient's Cardiovascular Status Stable, Respiratory Function Stable, No signs of Nausea or vomiting, Adequate PO intake, Pain level controlled, No headache, No backache, No residual numbness and No residual motor weakness  Post-op Vital Signs: stable  Complications: No apparent anesthesia complications

## 2012-10-30 NOTE — Progress Notes (Signed)
Post Partum Day 1 Subjective: no complaints, up ad lib, voiding and tolerating PO, small lochia, plans to breastfeed, still undecidedabout contraception.  Options discussed Objective: Blood pressure 111/74, pulse 82, temperature 98 F (36.7 C), temperature source Oral, resp. rate 17, height 5\' 1"  (1.549 m), weight 87.544 kg (193 lb), last menstrual period 03/12/2012, SpO2 99.00%, unknown if currently breastfeeding.  Physical Exam:  General: alert, cooperative and no distress Lochia:normal flow Chest: CTAB Heart: RRR no m/r/g Abdomen: +BS, soft, nontender,  Uterine Fundus: firm DVT Evaluation: No evidence of DVT seen on physical exam. Extremities: no edema   Recent Labs  10/29/12 0425  HGB 12.3  HCT 36.9    Assessment/Plan: Plan for discharge tomorrow and Lactation consult   LOS: 1 day   CRESENZO-DISHMAN,Tymeir [place] 10/30/2012, 7:47 AM

## 2012-10-31 MED ORDER — IBUPROFEN 200 MG PO TABS: 600.0000 mg | ORAL_TABLET | Freq: Four times a day (QID) | ORAL | Status: DC | PRN

## 2012-10-31 MED ORDER — CYCLOBENZAPRINE HCL 5 MG PO TABS
5.0000 mg | ORAL_TABLET | Freq: Three times a day (TID) | ORAL | Status: DC | PRN
  Filled 2012-10-31: qty 1

## 2012-10-31 NOTE — Discharge Summary (Signed)
Obstetric Discharge Summary Reason for Admission: rupture of membranes Prenatal Procedures: none Intrapartum Procedures: spontaneous vaginal delivery Postpartum Procedures: none Complications-Operative and Postpartum: 2nd degree perineal laceration Hemoglobin  Date Value Range Status  10/29/2012 12.3  12.0 - 15.0 g/dL Final     HCT  Date Value Range Status  10/29/2012 36.9  36.0 - 46.0 % Final    Physical Exam:  General: alert, cooperative, appears stated age and no distress Lochia: appropriate Uterine Fundus: firm Incision: NA  CTAB no wrc RRR no mgt DVT Evaluation: No evidence of DVT seen on physical exam. Negative Homan's sign. No cords or calf tenderness. No significant calf/ankle edema.  Discharge Diagnoses: Term Pregnancy-delivered  Discharge Information: Date: 10/31/2012 Activity: pelvic rest Diet: routine Medications: PNV and Ibuprofen Condition: stable Instructions: refer to practice specific booklet Discharge to: home MOC: POP MOF: Breast  Newborn Data: Live born female  Birth Weight: 6 lb 2.4 oz (2790 g) APGAR: 9, 9  Home with mother.  Jolyn Lent, RYAN 10/31/2012, 7:52 AM

## 2012-11-01 ENCOUNTER — Encounter: Payer: BC Managed Care – PPO | Admitting: Obstetrics & Gynecology

## 2012-11-03 DIAGNOSIS — Z029 Encounter for administrative examinations, unspecified: Secondary | ICD-10-CM

## 2012-11-04 ENCOUNTER — Encounter: Payer: BC Managed Care – PPO | Admitting: Obstetrics and Gynecology

## 2012-11-04 ENCOUNTER — Telehealth: Payer: Self-pay | Admitting: Obstetrics and Gynecology

## 2012-11-04 NOTE — Telephone Encounter (Signed)
Pt states that her feet and ankles are swelling pretty bad. Delivered on 10/29/12. Pt states she didn't have BP issues with pregnancy. Pt states she is having some headaches, but denies any blurred vision. Pt given appointment for tomorrow.

## 2012-11-05 ENCOUNTER — Ambulatory Visit (INDEPENDENT_AMBULATORY_CARE_PROVIDER_SITE_OTHER): Payer: BC Managed Care – PPO | Admitting: Obstetrics and Gynecology

## 2012-11-05 ENCOUNTER — Encounter: Payer: Self-pay | Admitting: Obstetrics and Gynecology

## 2012-11-05 DIAGNOSIS — I10 Essential (primary) hypertension: Secondary | ICD-10-CM

## 2012-11-05 DIAGNOSIS — R609 Edema, unspecified: Secondary | ICD-10-CM

## 2012-11-05 LAB — CBC
Platelets: 381 10*3/uL (ref 150–400)
RBC: 5.16 MIL/uL — ABNORMAL HIGH (ref 3.87–5.11)
RDW: 16.3 % — ABNORMAL HIGH (ref 11.5–15.5)
WBC: 6.2 10*3/uL (ref 4.0–10.5)

## 2012-11-05 MED ORDER — HYDROCHLOROTHIAZIDE 25 MG PO TABS: 25.0000 mg | ORAL_TABLET | Freq: Every day | ORAL | Status: DC

## 2012-11-05 NOTE — Progress Notes (Signed)
Patient ID: Theresa Rosario, female   DOB: 1985/09/15, 27 y.o.   MRN: [id number] Pt here today for swelling in her feet and ankles and headaches. Pt denies any blurred vision. Pt states her legs hurt when she walks, the right hurts worse. Pt is breast and bottle feeding. S: single , lives with FoB, + headaches, daily, intermittent, normal vision,  Legs ache, normal lochia,  BC- pills. BP 150/100  Ht 5\' 4"  (1.626 m)  Wt 183 lb 3.2 oz (83.099 kg)  BMI 31.43 kg/m2  LMP 03/12/2012  Physical Examination: General appearance - alert, well appearing, and in no distress and normal appearing weight Mental status - alert, oriented to person, place, and time, normal mood, behavior, speech, dress, motor activity, and thought processes Eyes - pupils equal and reactive, extraocular eye movements intact, sclera anicteric Neck - supple, no significant adenopathy Chest - clear to auscultation, no wheezes, rales or rhonchi, symmetric air entry Abdomen - soft, nontender, nondistended, no masses or organomegaly Neurological - alert, oriented, normal speech, no focal findings or movement disorder noted Extremities - pedal edema 1 +, Homan's sign negative bilaterally, reflexes 2+-3+ no clonus Skin - normal coloration and turgor, no rashes, no suspicious skin lesions noted  A Postpartum HTN,  P will check PIH labs,     Begin hctz 25 q d x 7 d     Rechk 1 wk.

## 2012-11-06 LAB — COMPREHENSIVE METABOLIC PANEL
ALT: 14 U/L (ref 0–35)
CO2: 25 mEq/L (ref 19–32)
Calcium: 9 mg/dL (ref 8.4–10.5)
Chloride: 103 mEq/L (ref 96–112)
Creat: 0.66 mg/dL (ref 0.50–1.10)
Glucose, Bld: 78 mg/dL (ref 70–99)
Sodium: 136 mEq/L (ref 135–145)
Total Protein: 6.2 g/dL (ref 6.0–8.3)

## 2012-11-12 ENCOUNTER — Encounter: Payer: Self-pay | Admitting: Obstetrics & Gynecology

## 2012-11-12 ENCOUNTER — Ambulatory Visit (INDEPENDENT_AMBULATORY_CARE_PROVIDER_SITE_OTHER): Payer: BC Managed Care – PPO | Admitting: Obstetrics & Gynecology

## 2012-11-12 VITALS — BP 102/72 | Ht 61.0 in | Wt 171.5 lb

## 2012-11-12 DIAGNOSIS — R609 Edema, unspecified: Secondary | ICD-10-CM

## 2012-11-12 DIAGNOSIS — I10 Essential (primary) hypertension: Secondary | ICD-10-CM

## 2012-11-12 NOTE — Progress Notes (Signed)
Patient ID: Theresa Rosario, female   DOB: 1985/06/16, 27 y.o.   MRN: [id number] BP good vagianl repair is good  Normal early pp course

## 2012-12-15 ENCOUNTER — Encounter: Payer: Self-pay | Admitting: Advanced Practice Midwife

## 2012-12-15 ENCOUNTER — Ambulatory Visit (INDEPENDENT_AMBULATORY_CARE_PROVIDER_SITE_OTHER): Payer: BC Managed Care – PPO | Admitting: Advanced Practice Midwife

## 2012-12-15 VITALS — BP 116/88 | Ht 61.0 in | Wt 187.0 lb

## 2012-12-15 DIAGNOSIS — Z3202 Encounter for pregnancy test, result negative: Secondary | ICD-10-CM

## 2012-12-15 LAB — POCT URINE PREGNANCY: Preg Test, Ur: NEGATIVE

## 2012-12-15 MED ORDER — BUPROPION HCL ER (XL) 150 MG PO TB24: 150.0000 mg | ORAL_TABLET | Freq: Every day | ORAL | Status: DC

## 2012-12-15 MED ORDER — NORETHIN-ETH ESTRAD-FE BIPHAS 1 MG-10 MCG / 10 MCG PO TABS: 1.0000 | ORAL_TABLET | Freq: Every day | ORAL | Status: DC

## 2012-12-15 MED ORDER — PRENATAL VITAMINS 0.8 MG PO TABS: 1.0000 | ORAL_TABLET | Freq: Every day | ORAL | Status: DC

## 2012-12-15 NOTE — Patient Instructions (Signed)
Mother's Milk (milk thistle, fenugreek) supplement

## 2012-12-15 NOTE — Progress Notes (Signed)
  Theresa Rosario is a 27 y.o. who presents for a postpartum visit. She is 7 weeks postpartum following a spontaneous vaginal delivery. I have fully reviewed the prenatal and intrapartum course. The delivery was at 38.5 gestational weeks.  Anesthesia: epidural. Postpartum course has been uneventful. Baby's course has been uneventful. Baby is feeding by breast at night and bottle during the day. Pumping too.  Encouraged to latch on more if she wants to make more milk.,. Bleeding: no bleeding. Bowel function is normal. Bladder function is normal. Patient is sexually active. Contraception method is none. Postpartum depression screening: positive.  She has been feeling sad more than she feels happy, "a little depressed" and cries almost daily.  FOB feels as if she acts depressed.  Had been on wellbutrin in the past (quit when pregnant) with good results. Will restart 150mg  daily    Review of Systems   Constitutional: Negative for fever and chills Eyes: Negative for visual disturbances Respiratory: Negative for shortness of breath, dyspnea Cardiovascular: Negative for chest pain or palpitations  Gastrointestinal: Negative for vomiting, diarrhea and constipation Genitourinary: Negative for dysuria and urgency Musculoskeletal: Negative for back pain, joint pain, myalgias  Neurological: Negative for dizziness and headaches   Objective:     Filed Vitals:   12/15/12 1141  BP: 116/88   General:  alert, cooperative and no distress   Breasts:  negative  Lungs: clear to auscultation bilaterally  Heart:  regular rate and rhythm  Abdomen: Soft, nontender   Vulva:  normal  Vagina: normal vagina  Cervix:  closed  Corpus: Well involuted     Rectal Exam: no hemorrhoids        Assessment:    normal postpartum exam. PPdepression Plan:    1. Contraception: LoLoestrin 2. Follow up in: as needed. Plans t ogo back to PCP for wellburtin management

## 2013-01-27 ENCOUNTER — Other Ambulatory Visit: Payer: Self-pay

## 2013-02-01 ENCOUNTER — Emergency Department (HOSPITAL_COMMUNITY)
Admission: EM | Admit: 2013-02-01 | Discharge: 2013-02-01 | Disposition: A | Discharge status: Home / Self Care | Payer: BC Managed Care – PPO | Attending: Emergency Medicine | Admitting: Emergency Medicine

## 2013-02-01 ENCOUNTER — Encounter (HOSPITAL_COMMUNITY): Payer: Self-pay | Admitting: Emergency Medicine

## 2013-02-01 ENCOUNTER — Emergency Department (HOSPITAL_COMMUNITY): Payer: BC Managed Care – PPO

## 2013-02-01 DIAGNOSIS — Z87448 Personal history of other diseases of urinary system: Secondary | ICD-10-CM | POA: Insufficient documentation

## 2013-02-01 DIAGNOSIS — Y9389 Activity, other specified: Secondary | ICD-10-CM | POA: Insufficient documentation

## 2013-02-01 DIAGNOSIS — S139XXA Sprain of joints and ligaments of unspecified parts of neck, initial encounter: Secondary | ICD-10-CM | POA: Insufficient documentation

## 2013-02-01 DIAGNOSIS — IMO0002 Reserved for concepts with insufficient information to code with codable children: Secondary | ICD-10-CM | POA: Insufficient documentation

## 2013-02-01 DIAGNOSIS — S161XXA Strain of muscle, fascia and tendon at neck level, initial encounter: Secondary | ICD-10-CM

## 2013-02-01 DIAGNOSIS — Z8742 Personal history of other diseases of the female genital tract: Secondary | ICD-10-CM | POA: Insufficient documentation

## 2013-02-01 DIAGNOSIS — Y92410 Unspecified street and highway as the place of occurrence of the external cause: Secondary | ICD-10-CM | POA: Insufficient documentation

## 2013-02-01 DIAGNOSIS — Z79899 Other long term (current) drug therapy: Secondary | ICD-10-CM | POA: Insufficient documentation

## 2013-02-01 DIAGNOSIS — S43402A Unspecified sprain of left shoulder joint, initial encounter: Secondary | ICD-10-CM

## 2013-02-01 MED ORDER — ACETAMINOPHEN-CODEINE #3 300-30 MG PO TABS: 1.0000 | ORAL_TABLET | Freq: Four times a day (QID) | ORAL | Status: DC | PRN

## 2013-02-01 MED ORDER — CYCLOBENZAPRINE HCL 10 MG PO TABS: 10.0000 mg | ORAL_TABLET | Freq: Three times a day (TID) | ORAL | Status: DC | PRN
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
## 2013-02-01 NOTE — ED Notes (Signed)
C-Collar applied
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
## 2013-02-06 NOTE — ED Provider Notes (Signed)
Medical screening examination/treatment/procedure(s) were performed by non-physician practitioner and as supervising physician I was immediately available for consultation/collaboration.  EKG Interpretation   None         Joya Gaskins, MD 02/06/13 [phone]

## 2013-04-04 ENCOUNTER — Telehealth: Payer: Self-pay | Admitting: Obstetrics and Gynecology

## 2013-04-04 NOTE — Telephone Encounter (Signed)
Pt's insurance not working, wanted to know if we had some samples of Lo Loestrin. I advised the pt that I would put a couple samples at the front desk for her.

## 2013-05-30 ENCOUNTER — Telehealth: Payer: Self-pay | Admitting: Obstetrics & Gynecology

## 2013-05-30 MED ORDER — NORETHIN ACE-ETH ESTRAD-FE 1-20 MG-MCG PO TABS: 1.0000 | ORAL_TABLET | Freq: Every day | ORAL | Status: DC

## 2013-05-30 NOTE — Telephone Encounter (Signed)
Pt wants generic OC, says lo loestrin too expensive will rx junel 1/20 use condoms

## 2013-05-30 NOTE — Telephone Encounter (Signed)
Pt requesting generic RX for Lo loestrin Fe due to cost.

## 2014-01-23 ENCOUNTER — Encounter (HOSPITAL_COMMUNITY): Payer: Self-pay | Admitting: Emergency Medicine

## 2014-05-03 ENCOUNTER — Other Ambulatory Visit: Payer: Self-pay | Admitting: Adult Health

## 2014-05-29 ENCOUNTER — Other Ambulatory Visit: Payer: Self-pay | Admitting: Adult Health

## 2014-07-23 ENCOUNTER — Other Ambulatory Visit: Payer: Self-pay | Admitting: Adult Health

## 2014-08-22 ENCOUNTER — Other Ambulatory Visit: Payer: Self-pay | Admitting: Adult Health

## 2014-09-27 ENCOUNTER — Encounter: Payer: Self-pay | Admitting: Family Medicine

## 2014-11-14 ENCOUNTER — Other Ambulatory Visit: Payer: Self-pay | Admitting: Adult Health

## 2015-01-01 ENCOUNTER — Emergency Department (HOSPITAL_COMMUNITY): Payer: 59

## 2015-01-01 ENCOUNTER — Encounter (HOSPITAL_COMMUNITY): Payer: Self-pay | Admitting: Emergency Medicine

## 2015-01-01 ENCOUNTER — Emergency Department (HOSPITAL_COMMUNITY)
Admission: EM | Admit: 2015-01-01 | Discharge: 2015-01-01 | Disposition: A | Discharge status: Home / Self Care | Payer: 59 | Attending: Physician Assistant | Admitting: Physician Assistant

## 2015-01-01 DIAGNOSIS — Z8742 Personal history of other diseases of the female genital tract: Secondary | ICD-10-CM | POA: Diagnosis not present

## 2015-01-01 DIAGNOSIS — M79604 Pain in right leg: Secondary | ICD-10-CM | POA: Insufficient documentation

## 2015-01-01 DIAGNOSIS — Z87448 Personal history of other diseases of urinary system: Secondary | ICD-10-CM | POA: Diagnosis not present

## 2015-01-01 DIAGNOSIS — R740 Nonspecific elevation of levels of transaminase and lactic acid dehydrogenase [LDH]: Secondary | ICD-10-CM | POA: Insufficient documentation

## 2015-01-01 DIAGNOSIS — M79605 Pain in left leg: Secondary | ICD-10-CM | POA: Diagnosis present

## 2015-01-01 DIAGNOSIS — R109 Unspecified abdominal pain: Secondary | ICD-10-CM | POA: Diagnosis not present

## 2015-01-01 DIAGNOSIS — M797 Fibromyalgia: Secondary | ICD-10-CM | POA: Insufficient documentation

## 2015-01-01 DIAGNOSIS — Z79899 Other long term (current) drug therapy: Secondary | ICD-10-CM | POA: Diagnosis not present

## 2015-01-01 DIAGNOSIS — R7401 Elevation of levels of liver transaminase levels: Secondary | ICD-10-CM

## 2015-01-01 LAB — URINALYSIS, ROUTINE W REFLEX MICROSCOPIC
BILIRUBIN URINE: NEGATIVE
Glucose, UA: NEGATIVE mg/dL
KETONES UR: NEGATIVE mg/dL
Leukocytes, UA: NEGATIVE
Nitrite: NEGATIVE
PH: 6.5 (ref 5.0–8.0)
Protein, ur: NEGATIVE mg/dL
SPECIFIC GRAVITY, URINE: 1.02 (ref 1.005–1.030)
UROBILINOGEN UA: 0.2 mg/dL (ref 0.0–1.0)

## 2015-01-01 LAB — CBC WITH DIFFERENTIAL/PLATELET
Basophils Absolute: 0 10*3/uL (ref 0.0–0.1)
Basophils Relative: 0 %
Eosinophils Absolute: 0 10*3/uL (ref 0.0–0.7)
Eosinophils Relative: 1 %
HCT: 41.6 % (ref 36.0–46.0)
HEMOGLOBIN: 13.6 g/dL (ref 12.0–15.0)
LYMPHS ABS: 1.3 10*3/uL (ref 0.7–4.0)
Lymphocytes Relative: 22 %
MCH: 24.7 pg — ABNORMAL LOW (ref 26.0–34.0)
MCHC: 32.7 g/dL (ref 30.0–36.0)
MCV: 75.5 fL — AB (ref 78.0–100.0)
Monocytes Absolute: 0.5 10*3/uL (ref 0.1–1.0)
Monocytes Relative: 8 %
NEUTROS ABS: 4.3 10*3/uL (ref 1.7–7.7)
NEUTROS PCT: 69 %
Platelets: 357 10*3/uL (ref 150–400)
RBC: 5.51 MIL/uL — AB (ref 3.87–5.11)
RDW: 14.4 % (ref 11.5–15.5)
WBC: 6.1 10*3/uL (ref 4.0–10.5)

## 2015-01-01 LAB — COMPREHENSIVE METABOLIC PANEL
ALK PHOS: 80 U/L (ref 38–126)
ALT: 103 U/L — AB (ref 14–54)
AST: 45 U/L — ABNORMAL HIGH (ref 15–41)
Albumin: 3.8 g/dL (ref 3.5–5.0)
Anion gap: 6 (ref 5–15)
BUN: 8 mg/dL (ref 6–20)
CALCIUM: 8.4 mg/dL — AB (ref 8.9–10.3)
CO2: 20 mmol/L — AB (ref 22–32)
CREATININE: 0.79 mg/dL (ref 0.44–1.00)
Chloride: 111 mmol/L (ref 101–111)
GFR calc Af Amer: >60 mL/min (ref >60)
GFR calc non Af Amer: >60 mL/min (ref >60)
Glucose, Bld: 95 mg/dL (ref 65–99)
Potassium: 4.1 mmol/L (ref 3.5–5.1)
Sodium: 137 mmol/L (ref 135–145)
TOTAL PROTEIN: 7.6 g/dL (ref 6.5–8.1)
Total Bilirubin: 0.2 mg/dL — ABNORMAL LOW (ref 0.3–1.2)

## 2015-01-01 LAB — URINE MICROSCOPIC-ADD ON

## 2015-01-01 MED ORDER — CYCLOBENZAPRINE HCL 10 MG PO TABS
10.0000 mg | ORAL_TABLET | Freq: Two times a day (BID) | ORAL | Status: DC | PRN
Start: 2015-01-01 — End: 2018-08-12

## 2015-01-01 MED ORDER — OXYCODONE-ACETAMINOPHEN 5-325 MG PO TABS
1.0000 | ORAL_TABLET | Freq: Once | ORAL | Status: AC
  Administered 2015-01-01: 1 via ORAL
  Filled 2015-01-01: qty 1

## 2015-01-01 NOTE — ED Notes (Signed)
PT c/o bilateral upper leg cramping and soreness x7days and states hx of the same with known anemia and hypokalemia in the past.

## 2015-01-01 NOTE — Discharge Instructions (Signed)
You were seen today for leg cramping and right flank pain. Your ultrasound was normal. Your urine looks normal. In your potassium is fine. Please use ibuprofen and Flexeril as needed. Please follow-up with regular physician. Please come back with any change in symptoms.

## 2015-01-01 NOTE — ED Notes (Signed)
MD Mackuen at bedside.  
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
## 2015-01-01 NOTE — ED Notes (Signed)
Phlebotomy at bedside.

## 2015-01-16 ENCOUNTER — Other Ambulatory Visit: Payer: Self-pay | Admitting: Obstetrics & Gynecology

## 2015-01-16 ENCOUNTER — Ambulatory Visit (INDEPENDENT_AMBULATORY_CARE_PROVIDER_SITE_OTHER): Payer: 59 | Admitting: Obstetrics & Gynecology

## 2015-01-16 ENCOUNTER — Other Ambulatory Visit (INDEPENDENT_AMBULATORY_CARE_PROVIDER_SITE_OTHER): Payer: 59

## 2015-01-16 ENCOUNTER — Encounter: Payer: Self-pay | Admitting: Obstetrics & Gynecology

## 2015-01-16 VITALS — BP 120/80 | HR 70 | Wt 207.0 lb

## 2015-01-16 DIAGNOSIS — R1031 Right lower quadrant pain: Secondary | ICD-10-CM

## 2015-01-16 DIAGNOSIS — R7989 Other specified abnormal findings of blood chemistry: Secondary | ICD-10-CM

## 2015-01-16 DIAGNOSIS — R945 Abnormal results of liver function studies: Secondary | ICD-10-CM

## 2015-01-16 NOTE — Progress Notes (Signed)
PELVIC US TA/TV: normal retroverted uterus,normal ov's bilat (mobile),no free fluid,rt adnexal pain during ultrasound,EEC 4mm

## 2015-01-16 NOTE — Progress Notes (Signed)
Patient ID: Theresa Rosario, female   DOB: 10-Jan-1986, 29 y.o.   MRN: [id number] US Transvaginal Non-ob  01/16/2015  GYNECOLOGIC SONOGRAM Theresa Rosario is a 29 y.o. G1P1001 LMP 01/09/2015 for a pelvic sonogram for RLQ pain. Uterus                      7 x 5 x 3.5 cm, normal retroverted uterus Endometrium          4 mm, symmetrical, wnl Right ovary             2.8 x 3.0 x 2.0 cm, wnl Left ovary                1.9 x 3.2 x 1.7 cm, wnl No free fluid seen Technician Comments: PELVIC US TA/TV: normal retroverted uterus,normal ov's bilat (mobile),no free fluid,rt adnexal pain during ultrasound,EEC 4mm [employer] 01/16/2015 3:15 PM Clinical Impression and recommendations: I have reviewed the sonogram results above, combined with the patient's current clinical course, below are my impressions and any appropriate recommendations for management based on the sonographic findings. Normal uterus and endometrium Both ovaries normal with no anatomic abnormalities, notably no explanation for the patient's pain complaints Denorris Reust H 01/16/2015 3:16 PM   US Pelvis Complete  01/16/2015  GYNECOLOGIC SONOGRAM Theresa Rosario is a 29 y.o. G1P1001 LMP 01/09/2015 for a pelvic sonogram for RLQ pain. Uterus                      7 x 5 x 3.5 cm, normal retroverted uterus Endometrium          4 mm, symmetrical, wnl Right ovary             2.8 x 3.0 x 2.0 cm, wnl Left ovary                1.9 x 3.2 x 1.7 cm, wnl No free fluid seen Technician Comments: PELVIC US TA/TV: normal retroverted uterus,normal ov's bilat (mobile),no free fluid,rt adnexal pain during ultrasound,EEC 4mm [employer] 01/16/2015 3:15 PM Clinical Impression and recommendations: I have reviewed the sonogram results above, combined with the patient's current clinical course, below are my impressions and any appropriate recommendations for management based on the sonographic findings. Normal uterus and endometrium Both ovaries normal with no anatomic  abnormalities, notably no explanation for the patient's pain complaints Alfred Harrel H 01/16/2015 3:16 PM   Discussed normal results with patient  No gyn source identified for her RLQ pain  Pain is episodic and crampy Not associated with menses  No further gyn evaluation indicated at this point  Past Medical History  Diagnosis Date  . Fibromyalgia   . Hx of ovarian cyst   . Hx of hematuria     Past Surgical History  Procedure Laterality Date  . Wisdom tooth extraction Bilateral     OB History    Gravida Para Term Preterm AB TAB SAB Ectopic Multiple Living   Allergies  Allergen Reactions  . Motrin [Ibuprofen]     Rash     Social History   Social History  . Marital Status: Single    Spouse Name: N/A  . Number of Children: N/A  . Years of Education: N/A   Social History Main Topics  . Smoking status: Never Smoker   . Smokeless tobacco: Never Used  . Alcohol Use: No  .  Drug Use: No  . Sexual Activity: No     Comment: five months ago    Other Topics Concern  . None   Social History Narrative    Family History  Problem Relation Age of Onset  . Cancer Mother 943    colon  . Diabetes Mother   . Hypertension Mother   . Coronary artery disease Other   . Diabetes Other   . Hypertension Father   . Heart disease Maternal Grandfather

## 2015-01-29 ENCOUNTER — Institutional Professional Consult (permissible substitution): Payer: 59 | Admitting: Internal Medicine

## 2015-01-31 ENCOUNTER — Emergency Department (HOSPITAL_COMMUNITY)
Admission: EM | Admit: 2015-01-31 | Discharge: 2015-01-31 | Disposition: A | Discharge status: Home / Self Care | Payer: 59 | Attending: Emergency Medicine | Admitting: Emergency Medicine

## 2015-01-31 ENCOUNTER — Encounter (HOSPITAL_COMMUNITY): Payer: Self-pay

## 2015-01-31 DIAGNOSIS — Z79899 Other long term (current) drug therapy: Secondary | ICD-10-CM | POA: Diagnosis not present

## 2015-01-31 DIAGNOSIS — K0889 Other specified disorders of teeth and supporting structures: Secondary | ICD-10-CM | POA: Diagnosis not present

## 2015-01-31 DIAGNOSIS — Z8742 Personal history of other diseases of the female genital tract: Secondary | ICD-10-CM | POA: Diagnosis not present

## 2015-01-31 DIAGNOSIS — M797 Fibromyalgia: Secondary | ICD-10-CM | POA: Insufficient documentation

## 2015-01-31 DIAGNOSIS — K029 Dental caries, unspecified: Secondary | ICD-10-CM | POA: Insufficient documentation

## 2015-01-31 MED ORDER — ACETAMINOPHEN-CODEINE #3 300-30 MG PO TABS: 1.0000 | ORAL_TABLET | Freq: Four times a day (QID) | ORAL | Status: DC | PRN

## 2015-01-31 MED ORDER — CLINDAMYCIN HCL 150 MG PO CAPS: 300.0000 mg | ORAL_CAPSULE | Freq: Four times a day (QID) | ORAL | Status: DC

## 2015-01-31 MED ORDER — CLINDAMYCIN HCL 150 MG PO CAPS
300.0000 mg | ORAL_CAPSULE | Freq: Once | ORAL | Status: AC
  Administered 2015-01-31: 300 mg via ORAL
  Filled 2015-01-31: qty 2

## 2015-01-31 NOTE — ED Provider Notes (Signed)
CSN: [id number]     Arrival date & time 01/31/15  1107 History   First MD Initiated Contact with Patient 01/31/15 1144     Chief Complaint  Patient presents with  . Facial Pain     (Consider location/radiation/quality/duration/timing/severity/associated sxs/prior Treatment) HPI   Theresa Rosario is a 29 y.o. female who presents to the Emergency Department complaining of facial pain and toothache since yesterday.  She states the tooth began hurting after eating.  She describes a sharp, radiating pain to her left face toward the left ear.  Pain is worse with chewing.  She reports hx of dental decay, but does not have funds to see a dentist.  She denies fever, facial swelling, chills, neck pain or difficulty swallowing.     Past Medical History  Diagnosis Date  . Fibromyalgia   . Hx of ovarian cyst   . Hx of hematuria    Past Surgical History  Procedure Laterality Date  . Wisdom tooth extraction Bilateral    Family History  Problem Relation Age of Onset  . Cancer Mother 1743    colon  . Diabetes Mother   . Hypertension Mother   . Coronary artery disease Other   . Diabetes Other   . Hypertension Father   . Heart disease Maternal Grandfather    Social History  Substance Use Topics  . Smoking status: Never Smoker   . Smokeless tobacco: Never Used  . Alcohol Use: No   OB History    Gravida Para Term Preterm AB TAB SAB Ectopic Multiple Living   1 1 1       1      Review of Systems  Constitutional: Negative for fever and appetite change.  HENT: Positive for dental problem. Negative for congestion, facial swelling, sore throat and trouble swallowing.   Eyes: Negative for pain and visual disturbance.  Musculoskeletal: Negative for neck pain and neck stiffness.  Neurological: Negative for dizziness, facial asymmetry and headaches.  Hematological: Negative for adenopathy.  All other systems reviewed and are negative.     Allergies  Motrin  Home Medications    Prior to Admission medications   Medication Sig Start Date End Date Taking? Authorizing Provider  acetaminophen (TYLENOL) 500 MG tablet Take 1,000 mg by mouth every 8 (eight) hours as needed for mild pain.     Historical Provider, MD  acetaminophen-codeine (TYLENOL #3) 300-30 MG tablet Take 1-2 tablets by mouth every 6 (six) hours as needed for moderate pain. 01/31/15   Sheyenne Konz, PA-C  baclofen (LIORESAL) 10 MG tablet Take 10 mg by mouth 2 (two) times daily as needed. Muscle spasm 09/27/14   Historical Provider, MD  buPROPion (WELLBUTRIN XL) 150 MG 24 hr tablet Take 1 tablet (150 mg total) by mouth daily. Patient not taking: Reported on 01/01/2015 12/15/12   Jacklyn ShellFrances Cresenzo-Dishmon, CNM  clindamycin (CLEOCIN) 150 MG capsule Take 2 capsules (300 mg total) by mouth 4 (four) times daily. For 7 days 01/31/15   Jazon Jipson, PA-C  cyclobenzaprine (FLEXERIL) 10 MG tablet Take 1 tablet (10 mg total) by mouth 2 (two) times daily as needed for muscle spasms. 01/01/15   Courteney Lyn Mackuen, MD  FLUoxetine (PROZAC) 20 MG capsule Take 60 mg by mouth daily. 12/05/14   Historical Provider, MD  hydrochlorothiazide (HYDRODIURIL) 25 MG tablet Take 1 tablet (25 mg total) by mouth daily. Patient not taking: Reported on 01/01/2015 11/05/12   Tilda BurrowJohn Ferguson V, MD  ibuprofen (ADVIL) 200 MG tablet Take 3 tablets (  600 mg total) by mouth every 6 (six) hours as needed for pain. 10/31/12   Minta Balsam, MD  MICROGESTIN FE 1/20 1-20 MG-MCG tablet TAKE 1 TABLET BY MOUTH EVERY DAY 11/14/14   Adline Potter, NP  Prenatal Multivit-Min-Fe-FA (PRENATAL VITAMINS) 0.8 MG tablet Take 1 tablet by mouth daily. Patient not taking: Reported on 01/01/2015 12/15/12   Jacklyn Shell, CNM  Prenatal Vit-Fe Fumarate-FA (PNV PRENATAL PLUS MULTIVITAMIN) 27-1 MG TABS Take 1 tablet by mouth daily. Patient not taking: Reported on 01/01/2015 07/13/12   Cheral Marker, CNM  zonisamide (ZONEGRAN) 50 MG capsule Take 150 mg by mouth  daily. 12/03/14   Historical Provider, MD   BP 129/55 mmHg  Pulse 85  Temp(Src) 98.3 F (36.8 C) (Oral)  Resp 20  Ht  (1.549 m)  Wt 204 lb (92.534 kg)  BMI 38.57 kg/m2  SpO2 100%  LMP 01/09/2015 Physical Exam  Constitutional: She is oriented to person, place, and time. She appears well-developed and well-nourished. No distress.  HENT:  Head: Normocephalic and atraumatic.  Right Ear: Tympanic membrane and ear canal normal.  Left Ear: Tympanic membrane and ear canal normal.  Mouth/Throat: Uvula is midline, oropharynx is clear and moist and mucous membranes are normal. No trismus in the jaw. Dental caries present. No dental abscesses or uvula swelling.  Tenderness and dental caries of the left lower second molar.  No facial swelling, obvious dental abscess, trismus, or sublingual abnml.    Neck: Normal range of motion. Neck supple.  Cardiovascular: Normal rate, regular rhythm and normal heart sounds.   No murmur heard. Pulmonary/Chest: Effort normal and breath sounds normal.  Musculoskeletal: Normal range of motion.  Lymphadenopathy:    She has no cervical adenopathy.  Neurological: She is alert and oriented to person, place, and time. She exhibits normal muscle tone. Coordination normal.  Skin: Skin is warm and dry.  Nursing note and vitals reviewed.   ED Course  Procedures (including critical care time)   MDM   Final diagnoses:  Pain, dental    Patient is well appearing. Vital signs are stable. Airways patent. No concerning signs for Ludwig's angina. Dental referral information given, Rxs for clindamycin and Tylenol #3    Pauline Aus, PA-C 01/31/15 1208  Donnetta Hutching, MD 01/31/15 1409

## 2015-01-31 NOTE — ED Notes (Signed)
Pt c/o pain in left side of face, left ear, and left eye x 2 days.

## 2015-01-31 NOTE — ED Notes (Addendum)
Pt states pain to left front tooth, which is broken,  began yesterday. She then began having pain to left upper molar area. Pt states history of "bad teeth" stating she does not have dental insurance.  Pt states pain is now radiating to her left ear and left eye (entire left side of face).No obvious swelling noted at this time. Pain became worse this mornring after eating cereal.TMs pearly gray with positive light reflex. Oral mucosa is pink. Pharynx is nonerythmatous. No click her of palpated with TMJ movement, although limited due to facial pain.

## 2015-03-07 ENCOUNTER — Other Ambulatory Visit: Payer: Self-pay | Admitting: Adult Health

## 2015-04-30 ENCOUNTER — Other Ambulatory Visit: Payer: Self-pay | Admitting: Adult Health

## 2015-08-03 LAB — BASIC METABOLIC PANEL: Glucose: 107 mg/dL

## 2015-08-21 ENCOUNTER — Other Ambulatory Visit: Payer: Self-pay | Admitting: Adult Health

## 2015-09-18 ENCOUNTER — Other Ambulatory Visit: Payer: Self-pay | Admitting: Adult Health

## 2015-10-18 ENCOUNTER — Ambulatory Visit (INDEPENDENT_AMBULATORY_CARE_PROVIDER_SITE_OTHER): Payer: BLUE CROSS/BLUE SHIELD | Admitting: Adult Health

## 2015-10-18 ENCOUNTER — Encounter: Payer: Self-pay | Admitting: Adult Health

## 2015-10-18 ENCOUNTER — Other Ambulatory Visit (HOSPITAL_COMMUNITY)
Admission: RE | Admit: 2015-10-18 | Discharge: 2015-10-18 | Disposition: A | Discharge status: Home / Self Care | Payer: BLUE CROSS/BLUE SHIELD | Source: Ambulatory Visit | Attending: Adult Health | Admitting: Adult Health

## 2015-10-18 VITALS — BP 110/62 | HR 70 | Ht 61.0 in | Wt 198.0 lb

## 2015-10-18 DIAGNOSIS — Z1151 Encounter for screening for human papillomavirus (HPV): Secondary | ICD-10-CM | POA: Insufficient documentation

## 2015-10-18 DIAGNOSIS — Z113 Encounter for screening for infections with a predominantly sexual mode of transmission: Secondary | ICD-10-CM | POA: Insufficient documentation

## 2015-10-18 DIAGNOSIS — Z01419 Encounter for gynecological examination (general) (routine) without abnormal findings: Secondary | ICD-10-CM | POA: Insufficient documentation

## 2015-10-18 DIAGNOSIS — R319 Hematuria, unspecified: Secondary | ICD-10-CM | POA: Diagnosis not present

## 2015-10-18 DIAGNOSIS — Z309 Encounter for contraceptive management, unspecified: Secondary | ICD-10-CM | POA: Insufficient documentation

## 2015-10-18 DIAGNOSIS — Z3041 Encounter for surveillance of contraceptive pills: Secondary | ICD-10-CM

## 2015-10-18 HISTORY — DX: Hematuria, unspecified: R31.9

## 2015-10-18 LAB — POCT URINALYSIS DIPSTICK
Glucose, UA: NEGATIVE
LEUKOCYTES UA: NEGATIVE
Nitrite, UA: NEGATIVE

## 2015-10-18 MED ORDER — NORETHIN ACE-ETH ESTRAD-FE 1-20 MG-MCG PO TABS: 1.0000 | ORAL_TABLET | Freq: Every day | ORAL | 12 refills | Status: DC

## 2015-10-18 NOTE — Progress Notes (Signed)
Patient ID: TRACA GILLOCK, female   DOB: 1985/08/29, 30 y.o.   MRN: [id number] History of Present Illness: Theresa Rosario is a 30 year old black female in for a well woman gyn exam and pap.She has noticed blood in her urine.She is happy with her OCs and needs a refill.Has labs with Dr Ouida Sills.  PCP is Dr Ouida Sills.   Current Medications, Allergies, Past Medical History, Past Surgical History, Family History and Social History were reviewed in [name].     Review of Systems: Patient denies any headaches, hearing loss, fatigue, blurred vision, shortness of breath, chest pain, abdominal pain, problems with bowel movements, urination, or intercourse. No joint pain or mood swings.See HPI for positives.    Physical Exam:BP 110/62 (BP Location: Left Arm, Patient Position: Sitting, Cuff Size: Large)   Pulse 70   Ht 5\' 1"  (1.549 m)   Wt 198 lb (89.8 kg)   LMP 10/17/2015 (Exact Date)   BMI 37.41 kg/m urine dipstick 3+ blood and trace protein General:  Well developed, well nourished, no acute distress Skin:  Warm and dry Neck:  Midline trachea, normal thyroid, good ROM, no lymphadenopathy Lungs; Clear to auscultation bilaterally Breast:  No dominant palpable mass, retraction, or nipple discharge.large and heavy  Cardiovascular: Regular rate and rhythm Abdomen:  Soft, non tender, no hepatosplenomegaly Pelvic:  External genitalia is normal in appearance, no lesions.  The vagina is normal in appearance,+ period like blood. Urethra has no lesions or masses. The cervix is smooth, pap with GC/CHL and HPV performed.  Uterus is felt to be normal size, shape, and contour.  No adnexal masses or tenderness noted.Bladder is non tender, no masses felt. Extremities/musculoskeletal:  No swelling or varicosities noted, no clubbing or cyanosis Psych:  No mood changes, alert and cooperative,seems happy   Impression: Well woman gyn exam and pap Blood in urine Contraceptive management      Plan: UA C&S sent Refilled microgestin 1/20 x 1 year take 1 daily Physical in 1 year, pap in 3 if normal  Push water

## 2015-10-18 NOTE — Patient Instructions (Signed)
Push fluids Physical in 1 year, pap in 3 if normal Labs with Dr Ouida Sills

## 2015-10-19 LAB — URINALYSIS, ROUTINE W REFLEX MICROSCOPIC
Bilirubin, UA: NEGATIVE
GLUCOSE, UA: NEGATIVE
Ketones, UA: NEGATIVE
Leukocytes, UA: NEGATIVE
Nitrite, UA: NEGATIVE
PH UA: 6 (ref 5.0–7.5)
Specific Gravity, UA: >=1.03 — AB (ref 1.005–1.030)
Urobilinogen, Ur: 0.2 mg/dL (ref 0.2–1.0)

## 2015-10-19 LAB — MICROSCOPIC EXAMINATION
Bacteria, UA: NONE SEEN
CASTS: NONE SEEN /LPF

## 2015-10-20 LAB — URINE CULTURE

## 2015-10-22 LAB — CYTOLOGY - PAP

## 2016-08-28 ENCOUNTER — Emergency Department (HOSPITAL_COMMUNITY): Payer: BLUE CROSS/BLUE SHIELD

## 2016-08-28 ENCOUNTER — Emergency Department (HOSPITAL_COMMUNITY)
Admission: EM | Admit: 2016-08-28 | Discharge: 2016-08-28 | Disposition: A | Discharge status: Home / Self Care | Payer: BLUE CROSS/BLUE SHIELD | Attending: Emergency Medicine | Admitting: Emergency Medicine

## 2016-08-28 ENCOUNTER — Encounter (HOSPITAL_COMMUNITY): Payer: Self-pay

## 2016-08-28 DIAGNOSIS — R109 Unspecified abdominal pain: Secondary | ICD-10-CM | POA: Diagnosis present

## 2016-08-28 DIAGNOSIS — N200 Calculus of kidney: Secondary | ICD-10-CM

## 2016-08-28 LAB — URINALYSIS, ROUTINE W REFLEX MICROSCOPIC
Bacteria, UA: NONE SEEN
Bilirubin Urine: NEGATIVE
GLUCOSE, UA: NEGATIVE mg/dL
Ketones, ur: NEGATIVE mg/dL
Leukocytes, UA: NEGATIVE
NITRITE: NEGATIVE
PH: 6 (ref 5.0–8.0)
PROTEIN: NEGATIVE mg/dL
SPECIFIC GRAVITY, URINE: 1.013 (ref 1.005–1.030)

## 2016-08-28 LAB — PREGNANCY, URINE: Preg Test, Ur: NEGATIVE

## 2016-08-28 LAB — POC OCCULT BLOOD, ED: Fecal Occult Bld: NEGATIVE

## 2016-08-28 MED ORDER — OXYCODONE-ACETAMINOPHEN 5-325 MG PO TABS: 1.0000 | ORAL_TABLET | ORAL | 0 refills | Status: DC | PRN

## 2016-08-28 MED ORDER — ONDANSETRON 4 MG PO TBDP: 4.0000 mg | ORAL_TABLET | Freq: Three times a day (TID) | ORAL | 0 refills | Status: DC | PRN

## 2016-08-28 MED ORDER — KETOROLAC TROMETHAMINE 60 MG/2ML IM SOLN
60.0000 mg | Freq: Once | INTRAMUSCULAR | Status: AC
  Administered 2016-08-28: 60 mg via INTRAMUSCULAR
  Filled 2016-08-28: qty 2

## 2016-08-28 MED ORDER — TAMSULOSIN HCL 0.4 MG PO CAPS: 0.4000 mg | ORAL_CAPSULE | Freq: Two times a day (BID) | ORAL | 0 refills | Status: DC

## 2016-08-28 MED ORDER — HYDROMORPHONE HCL 1 MG/ML IJ SOLN
1.0000 mg | Freq: Once | INTRAMUSCULAR | Status: AC
  Administered 2016-08-28: 1 mg via INTRAVENOUS
  Filled 2016-08-28: qty 1

## 2016-08-28 NOTE — ED Triage Notes (Signed)
Pt is complaining of left flank pain that started last night. States no injury to area. Took Tylenol last night and felt relief for 20 mins. No problems urinating.

## 2016-08-28 NOTE — Discharge Instructions (Signed)
Please take the medications exactly as prescribed, see the follow-up instructions for the urologist above, this may take several days or sometimes even longer to pass the kidney stone. If he should develop increased pain or vomiting please return to the emergency department.

## 2016-08-28 NOTE — ED Notes (Signed)
Pt. Back from CAT scan

## 2016-08-28 NOTE — ED Provider Notes (Signed)
[facility]EMER[facility]ENCY DEPT Provider Note   CSN: [id number] Arrival date & time: 08/28/16  [id number]     History   Chief Complaint Chief Complaint  Patient presents with  . Flank Pain    HPI Theresa Rosario is a 31 y.o. female.  HPI  The patient is a 31 year old female, history of fibromyalgia and ovarian cyst, presents with pain in her left flank which started last night at 11:00 PM, states that this pain has been constant, worse with movement, feels like a muscle spasm according to the patient. She has no associated fevers, chills, diarrhea, dysuria, hematuria, swelling, rashes, coughing or chest pain. She does have some nausea and vomiting and has vomited 4 times overnight. No history of kidney stones or urinary tract infections according to the patient. She does report a history of an allergy to Motrin but states that she can take Advil without any trouble.   Past Medical History:  Diagnosis Date  . Blood in urine 10/18/2015  . Fibromyalgia   . Hx of hematuria   . Hx of ovarian cyst     Patient Active Problem List   Diagnosis Date Noted  . Blood in urine 10/18/2015  . Contraceptive management 10/18/2015  . Condyloma of female genitalia 10/21/2012  . Fibromyalgia 07/13/2012  . Anxiety 07/13/2012  . Hx of hematuria 06/29/2012  . Lumbago 11/11/2011    Past Surgical History:  Procedure Laterality Date  . WISDOM TOOTH EXTRACTION Bilateral     OB History    Gravida Para Term Preterm AB Living   1 1 1     1    SAB TAB Ectopic Multiple Live Births           1       Home Medications    Prior to Admission medications   Medication Sig Start Date End Date Taking? Authorizing Provider  acetaminophen (TYLENOL) 500 MG tablet Take 1,000 mg by mouth every 8 (eight) hours as needed for mild pain.     [provider]  baclofen (LIORESAL) 10 MG tablet Take 10 mg by mouth 2 (two) times daily as needed. Muscle spasm 09/27/14   [provider]  cyclobenzaprine  (FLEXERIL) 10 MG tablet Take 1 tablet (10 mg total) by mouth 2 (two) times daily as needed for muscle spasms. 01/01/15   Mackuen, Courteney Lyn, MD  FLUoxetine (PROZAC) 20 MG capsule Take 60 mg by mouth daily. 12/05/14   [provider]  meloxicam (MOBIC) 7.5 MG tablet Take 7.5 mg by mouth daily.  10/14/15   [provider]  norethindrone-ethinyl estradiol (MICROGESTIN FE 1/20) 1-20 MG-MCG tablet Take 1 tablet by mouth daily. 10/18/15   Adline PotterGriffin, Jennifer A, NP  ondansetron (ZOFRAN ODT) 4 MG disintegrating tablet Take 1 tablet (4 mg total) by mouth every 8 (eight) hours as needed for nausea. 08/28/16   Eber HongMiller, Xzavier Swinger, MD  oxyCODONE-acetaminophen (PERCOCET) 5-325 MG tablet Take 1 tablet by mouth every 4 (four) hours as needed. 08/28/16   Eber HongMiller, Alli Jasmer, MD  tamsulosin (FLOMAX) 0.4 MG CAPS capsule Take 1 capsule (0.4 mg total) by mouth 2 (two) times daily. 08/28/16   Eber HongMiller, Piccola Arico, MD  zonisamide (ZONEGRAN) 50 MG capsule Take 150 mg by mouth daily. 12/03/14   [provider]    Family History Family History  Problem Relation Age of Onset  . Cancer Mother 3343       colon  . Diabetes Mother   . Hypertension Mother   . Hypertension Father   .  Heart disease Maternal Grandfather   . Coronary artery disease Other   . Diabetes Other   . Diabetes Paternal Grandmother   . Hypertension Paternal Grandmother   . Diabetes Maternal Grandmother     Social History Social History  Substance Use Topics  . Smoking status: Never Smoker  . Smokeless tobacco: Never Used  . Alcohol use No     Allergies   Motrin [ibuprofen]   Review of Systems Review of Systems  All other systems reviewed and are negative.    Physical Exam Updated Vital Signs BP 105/73   Pulse 98   Temp 98.3 F (36.8 C) (Oral)   Resp 15   Ht 5\' 1"  (1.549 m)   Wt 102.1 kg (225 lb)   LMP 08/21/2016 Comment: neg uprg  SpO2 96%   BMI 42.51 kg/m   Physical Exam  Constitutional: She appears well-developed and  well-nourished. No distress.  HENT:  Head: Normocephalic and atraumatic.  Mouth/Throat: Oropharynx is clear and moist. No oropharyngeal exudate.  Eyes: Conjunctivae and EOM are normal. Pupils are equal, round, and reactive to light. Right eye exhibits no discharge. Left eye exhibits no discharge. No scleral icterus.  Neck: Normal range of motion. Neck supple. No JVD present. No thyromegaly present.  Cardiovascular: Normal rate, regular rhythm, normal heart sounds and intact distal pulses.  Exam reveals no gallop and no friction rub.   No murmur heard. Pulmonary/Chest: Effort normal and breath sounds normal. No respiratory distress. She has no wheezes. She has no rales.  Abdominal: Soft. Bowel sounds are normal. She exhibits no distension and no mass. There is tenderness ( Minimal tenderness over the left mid abdomen, no peritoneal signs guarding, no Murphy sign, no pain in McBurney's point).  Musculoskeletal: Normal range of motion. She exhibits tenderness ( Tender to palpation over the left flank). She exhibits no edema.  Lymphadenopathy:    She has no cervical adenopathy.  Neurological: She is alert. Coordination normal.  Skin: Skin is warm and dry. No rash noted. She is not diaphoretic. No erythema.  Psychiatric: She has a normal mood and affect. Her behavior is normal.  Nursing note and vitals reviewed.    ED Treatments / Results  Labs (all labs ordered are listed, but only abnormal results are displayed) Labs Reviewed  URINALYSIS, ROUTINE W REFLEX MICROSCOPIC - Abnormal; Notable for the following:       Result Value   Color, Urine STRAW (*)    Hgb urine dipstick MODERATE (*)    Squamous Epithelial / LPF 0-5 (*)    All other components within normal limits  PREGNANCY, URINE    Radiology US Renal  Result Date: 08/28/2016 CLINICAL DATA:  Left flank pain EXAM: RENAL / URINARY TRACT ULTRASOUND COMPLETE COMPARISON:  Abdominal CT 05/07/2006 FINDINGS: Right Kidney: Length: 12 cm.  Echogenicity within normal limits. No mass or hydronephrosis visualized. Left Kidney: Length: 12.6 cm. Mild hydronephrosis. There is an 8 mm echogenic focus over the lower pole without definite shadowing or twinkle artifact. Bladder: Appears normal for degree of bladder distention. Only a right ureteral jet was seen. IMPRESSION: 1. Mild left hydronephrosis. 2. Equivocal for 8 mm lower pole renal calculus on the left. Electronically Signed   By: Marnee Spring M.D.   On: 08/28/2016 08:56   Ct Renal Stone Study  Result Date: 08/28/2016 CLINICAL DATA:  Left flank pain since last night EXAM: CT ABDOMEN AND PELVIS WITHOUT CONTRAST TECHNIQUE: Multidetector CT imaging of the abdomen and pelvis was performed following  the standard protocol without IV contrast. COMPARISON:  Renal Ultrasound 08/28/2016 FINDINGS: Lower chest: Lung bases are clear. No effusions. Heart is normal size. Hepatobiliary: No focal hepatic abnormality. Gallbladder unremarkable. Scattered punctate hepatic calcifications compatible with old granulomatous disease. Pancreas: No focal abnormality or ductal dilatation. Spleen: Scattered calcifications compatible with old granulomatous disease. Normal size. Adrenals/Urinary Tract: Mild left hydronephrosis and perinephric stranding. 4 mm proximal left ureteral stone. No additional ureteral stones. Urinary bladder is decompressed. No stones or hydronephrosis on the right. Adrenal glands unremarkable. Stomach/Bowel: Normal appendix. Stomach, large and small bowel grossly unremarkable. Vascular/Lymphatic: No evidence of aneurysm or adenopathy. Reproductive: Uterus and adnexa unremarkable.  No mass. Other: No free fluid or free air. Musculoskeletal: No acute bony abnormality. IMPRESSION: 4 mm proximal left ureteral stone with mild left hydronephrosis and perinephric stranding. Old granulomatous disease in the liver and spleen. Electronically Signed   By: Charlett Nose M.D.   On: 08/28/2016 10:42     Procedures Procedures (including critical care time)  Medications Ordered in ED Medications  ketorolac (TORADOL) injection 60 mg (60 mg Intramuscular Given 08/28/16 0743)  HYDROmorphone (DILAUDID) injection 1 mg (1 mg Intravenous Given 08/28/16 0925)     Initial Impression / Assessment and Plan / ED Course  I have reviewed the triage vital signs and the nursing notes.  Pertinent labs & imaging results that were available during my care of the patient were reviewed by me and considered in my medical decision making (see chart for details).   The patient presents with new left-sided flank pain, the differential would include nephrolithiasis, urinary tract infection, muscle spasm, the patient is otherwise well-appearing vital signs are unremarkable.   CT shows KS after US showed some hydronephrosis - she has a 4mm proximal stone - no signs of infection  Pt improved after meds,   Stable for d/c Pt expressed understanding  Vitals:   08/28/16 0711 08/28/16 0715 08/28/16 0931 08/28/16 1000  BP:  133/71 119/84 105/73  Pulse:  (!) 108 98 98  Resp:  18 15 15   Temp:  98.3 F (36.8 C)    TempSrc:  Oral    SpO2:  99% 97% 96%  Weight: 102.1 kg (225 lb)     Height: 5\' 1"  (1.549 m)       Final Clinical Impressions(s) / ED Diagnoses   Final diagnoses:  Kidney stone on left side    New Prescriptions New Prescriptions   ONDANSETRON (ZOFRAN ODT) 4 MG DISINTEGRATING TABLET    Take 1 tablet (4 mg total) by mouth every 8 (eight) hours as needed for nausea.   OXYCODONE-ACETAMINOPHEN (PERCOCET) 5-325 MG TABLET    Take 1 tablet by mouth every 4 (four) hours as needed.   TAMSULOSIN (FLOMAX) 0.4 MG CAPS CAPSULE    Take 1 capsule (0.4 mg total) by mouth 2 (two) times daily.     Eber Hong, MD 08/28/16 1049

## 2016-08-28 NOTE — ED Notes (Signed)
Pt. To CAT scan

## 2016-08-28 NOTE — ED Notes (Signed)
Pt states pain has gone down significantly. States she still feels nauseous.

## 2016-10-16 ENCOUNTER — Other Ambulatory Visit: Payer: Self-pay | Admitting: Adult Health

## 2016-10-22 ENCOUNTER — Other Ambulatory Visit: Payer: BLUE CROSS/BLUE SHIELD | Admitting: Adult Health

## 2016-10-23 ENCOUNTER — Ambulatory Visit (INDEPENDENT_AMBULATORY_CARE_PROVIDER_SITE_OTHER): Payer: BLUE CROSS/BLUE SHIELD | Admitting: Adult Health

## 2016-10-23 ENCOUNTER — Encounter: Payer: Self-pay | Admitting: Adult Health

## 2016-10-23 VITALS — BP 124/82 | HR 84 | Ht 63.0 in | Wt 233.0 lb

## 2016-10-23 DIAGNOSIS — Z3041 Encounter for surveillance of contraceptive pills: Secondary | ICD-10-CM

## 2016-10-23 DIAGNOSIS — Z01419 Encounter for gynecological examination (general) (routine) without abnormal findings: Secondary | ICD-10-CM | POA: Diagnosis not present

## 2016-10-23 DIAGNOSIS — R635 Abnormal weight gain: Secondary | ICD-10-CM

## 2016-10-23 DIAGNOSIS — Z3202 Encounter for pregnancy test, result negative: Secondary | ICD-10-CM | POA: Diagnosis not present

## 2016-10-23 LAB — POCT URINE PREGNANCY: Preg Test, Ur: NEGATIVE

## 2016-10-23 MED ORDER — NORETHIN ACE-ETH ESTRAD-FE 1-20 MG-MCG PO TABS: 1.0000 | ORAL_TABLET | Freq: Every day | ORAL | 12 refills | Status: DC

## 2016-10-23 NOTE — Progress Notes (Signed)
Patient ID: Theresa Rosario, female   DOB: [birth year], 31 y.o.   MRN: [id number] History of Present Illness: Theresa Rosario is a 31 year old black female in for well woman gyn exam, she had normal pap 10/18/15 with negative HPV.She is complaining of weight gain. PCP is Dr Ouida SillsFagan.   Current Medications, Allergies, Past Medical History, Past Surgical History, Family History and Social History were reviewed in [facility] electronic medical record.     Review of Systems: Patient denies any headaches, hearing loss, fatigue, blurred vision, shortness of breath, chest pain, abdominal pain, problems with bowel movements, urination, or intercourse. No joint pain or mood swings.+weight gain    Physical Exam:BP 124/82 (BP Location: Right Arm, Patient Position: Sitting, Cuff Size: Normal)   Pulse 84   Ht 5\' 3"  (1.6 m)   Wt 233 lb (105.7 kg)   LMP 09/12/2016   BMI 41.27 kg/m UPT negative. General:  Well developed, well nourished, no acute distress Skin:  Warm and dry Neck:  Midline trachea, normal thyroid, good ROM, no lymphadenopathy Lungs; Clear to auscultation bilaterally Breast:  No dominant palpable mass, retraction, or nipple discharge,large  Cardiovascular: Regular rate and rhythm Abdomen:  Soft, non tender, no hepatosplenomegaly Pelvic:  External genitalia is normal in appearance, no lesions.  The vagina is normal in appearance. Urethra has no lesions or masses. The cervix is bulbous.  Uterus is felt to be normal size, shape, and contour.  No adnexal masses or tenderness noted.Bladder is non tender, no masses felt. Extremities/musculoskeletal:  No swelling or varicosities noted, no clubbing or cyanosis Psych:  No mood changes, alert and cooperative,seems happy PHQ 2 score 0.She has gained about 35 lbs in last year.Try to decrease carbs.and will check labs today.   Impression: 1. Well woman exam with routine gynecological exam   2. Encounter for surveillance of contraceptive pills   3.  Weight gain       Plan: Check CBC,CMP,TSH  Physical in 1 year, pap in 2020 Refilled junel 1/20 x 1 year, take 1 daily

## 2016-10-23 NOTE — Addendum Note (Signed)
Addended by: Tish FredericksonLANCASTER, Yama Nielson A on: 10/23/2016 02:58 PM   Modules accepted: Orders

## 2016-10-24 LAB — TSH: TSH: 1.65 u[IU]/mL (ref 0.450–4.500)

## 2016-10-24 LAB — COMPREHENSIVE METABOLIC PANEL
A/G RATIO: 1.3 (ref 1.2–2.2)
ALK PHOS: 96 IU/L (ref 39–117)
ALT: 21 IU/L (ref 0–32)
AST: 20 IU/L (ref 0–40)
Albumin: 4.1 g/dL (ref 3.5–5.5)
BUN / CREAT RATIO: 11 (ref 9–23)
BUN: 8 mg/dL (ref 6–20)
CO2: 21 mmol/L (ref 20–29)
Calcium: 9.6 mg/dL (ref 8.7–10.2)
Chloride: 107 mmol/L — ABNORMAL HIGH (ref 96–106)
Creatinine, Ser: 0.73 mg/dL (ref 0.57–1.00)
GFR calc Af Amer: 128 mL/min/{1.73_m2} (ref >59)
GFR, EST NON AFRICAN AMERICAN: 111 mL/min/{1.73_m2} (ref >59)
Globulin, Total: 3.1 g/dL (ref 1.5–4.5)
Glucose: 80 mg/dL (ref 65–99)
POTASSIUM: 4.3 mmol/L (ref 3.5–5.2)
SODIUM: 141 mmol/L (ref 134–144)
Total Protein: 7.2 g/dL (ref 6.0–8.5)

## 2016-10-24 LAB — CBC
Hematocrit: 44 % (ref 34.0–46.6)
Hemoglobin: 13.8 g/dL (ref 11.1–15.9)
MCH: 24.4 pg — ABNORMAL LOW (ref 26.6–33.0)
MCHC: 31.4 g/dL — ABNORMAL LOW (ref 31.5–35.7)
MCV: 78 fL — AB (ref 79–97)
PLATELETS: 389 10*3/uL — AB (ref 150–379)
RBC: 5.66 x10E6/uL — ABNORMAL HIGH (ref 3.77–5.28)
RDW: 14.4 % (ref 12.3–15.4)
WBC: 7.4 10*3/uL (ref 3.4–10.8)

## 2016-10-29 ENCOUNTER — Telehealth: Payer: Self-pay | Admitting: Adult Health

## 2016-10-29 NOTE — Telephone Encounter (Signed)
Pt aware of labs, and need to cut carbs and portions and increase exercise.

## 2016-11-21 ENCOUNTER — Other Ambulatory Visit: Payer: Self-pay | Admitting: Adult Health

## 2017-08-29 LAB — BASIC METABOLIC PANEL
BUN: 7 (ref 4–21)
Creatinine: 0.7 (ref 0.5–1.1)
Glucose: 89
Sodium: 139 (ref 137–147)

## 2017-08-29 LAB — HEPATIC FUNCTION PANEL
ALT: 21 (ref 7–35)
AST: 15 (ref 13–35)

## 2017-09-10 IMAGING — US US RENAL
1 series · 14 of 25 positions shown · non-contrast
Comparison: Abdominal CT 05/07/2006

CLINICAL DATA: Left flank pain

EXAM:
RENAL / URINARY TRACT ULTRASOUND COMPLETE

[Series 1: us renal · 0.24mm/px · 14 of 65 slices shown]
[im 1/65]
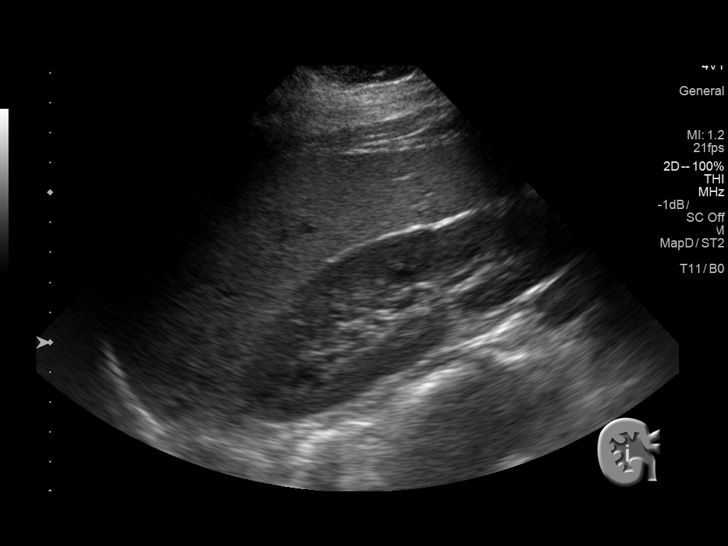
[im 6/65]
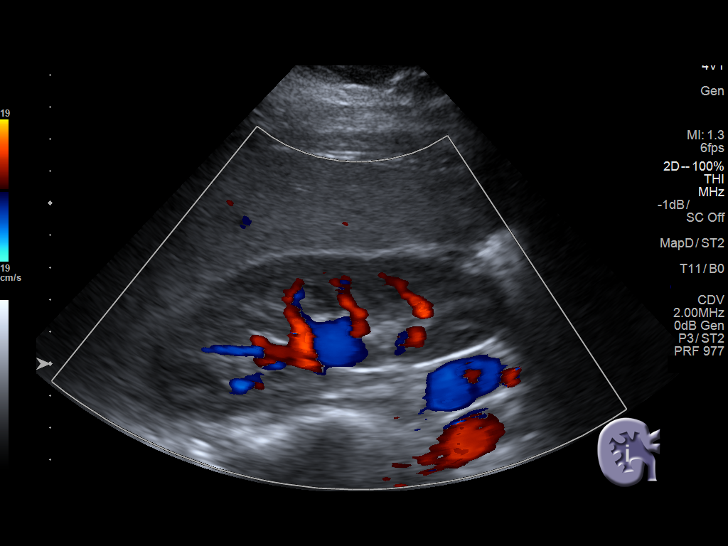
[im 11/65]
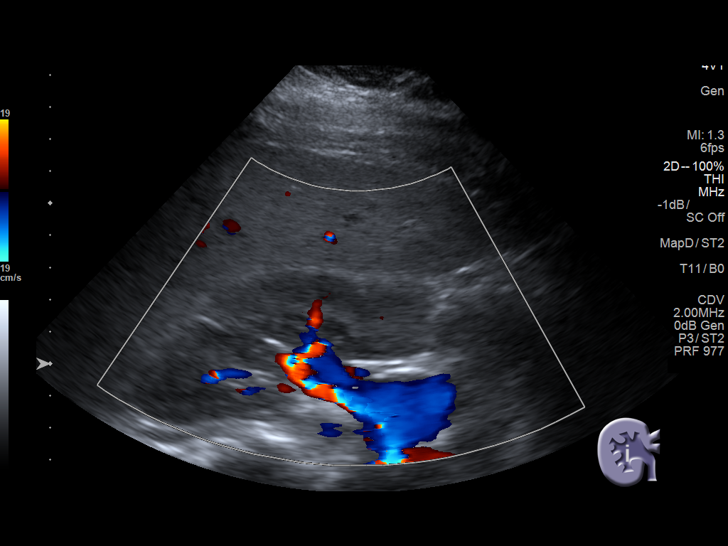
[im 17/65]
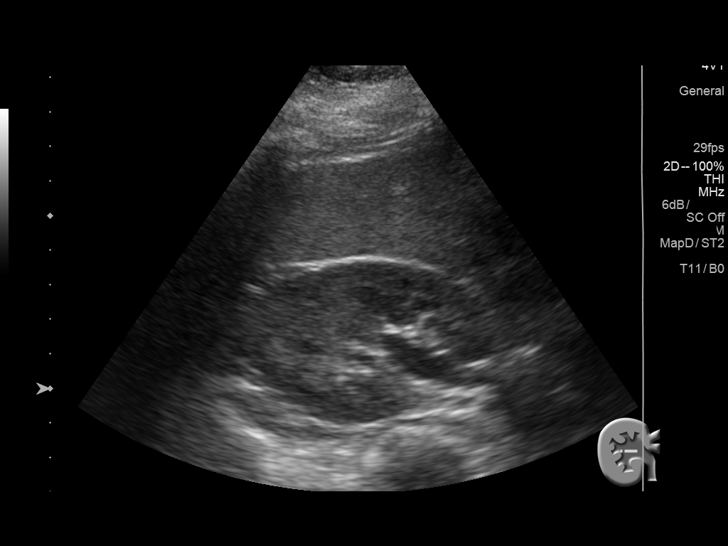
[im 22/65]
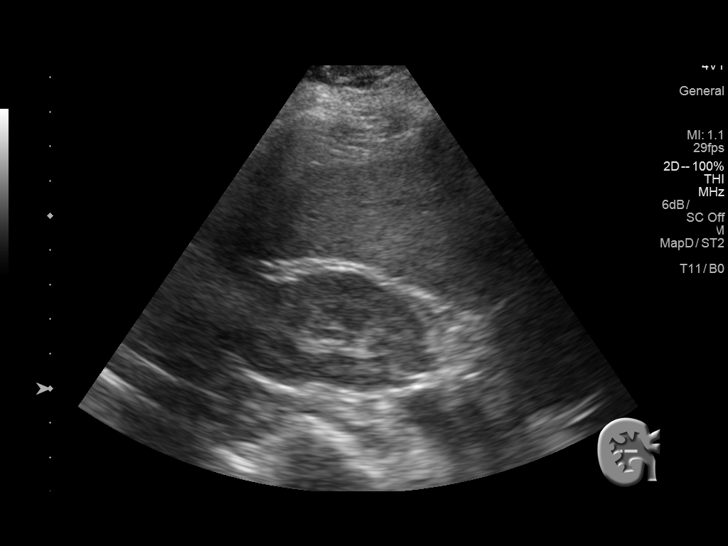
[im 25/65]
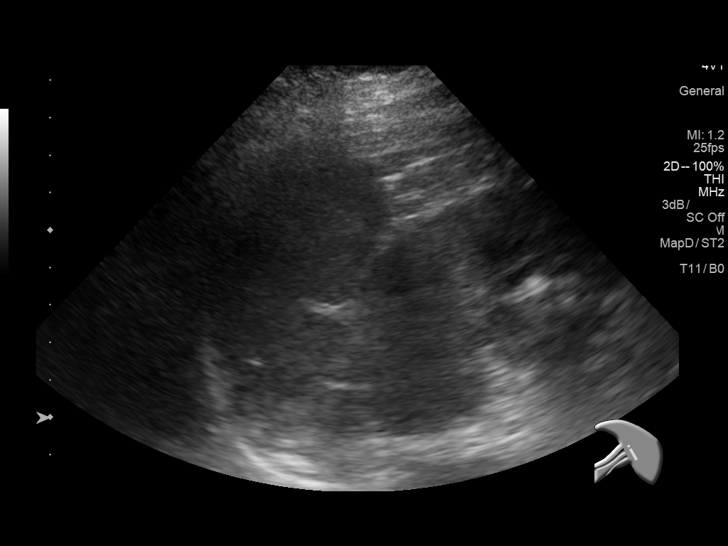
[im 30/65]
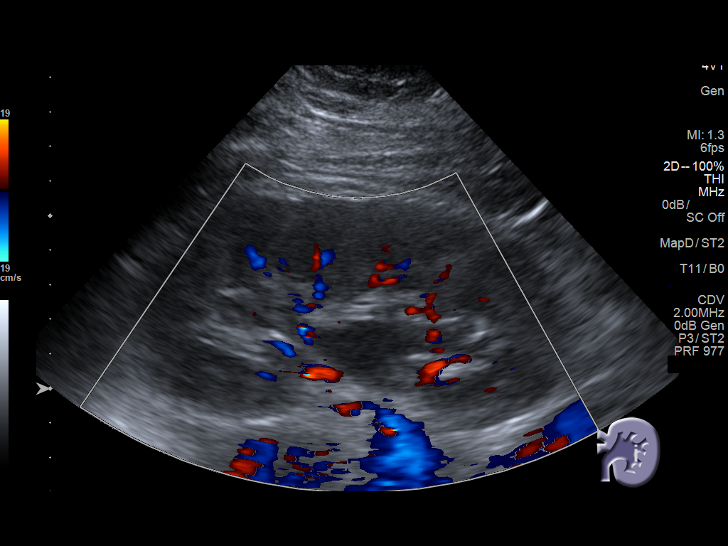
[im 35/65]
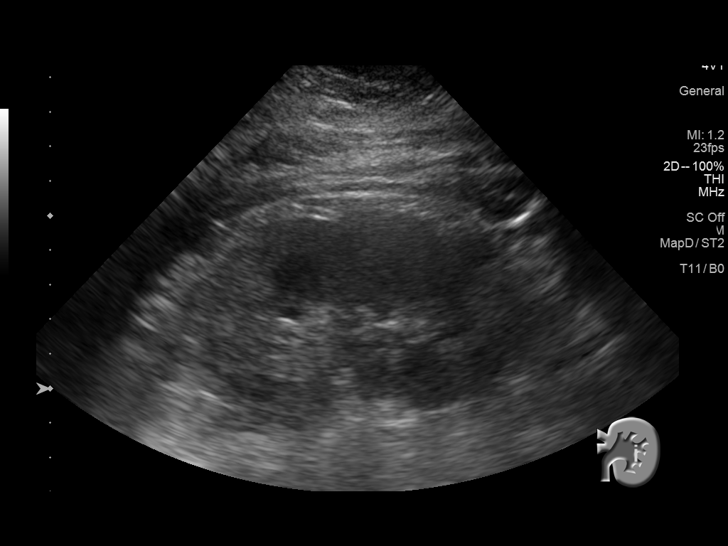
[im 41/65]
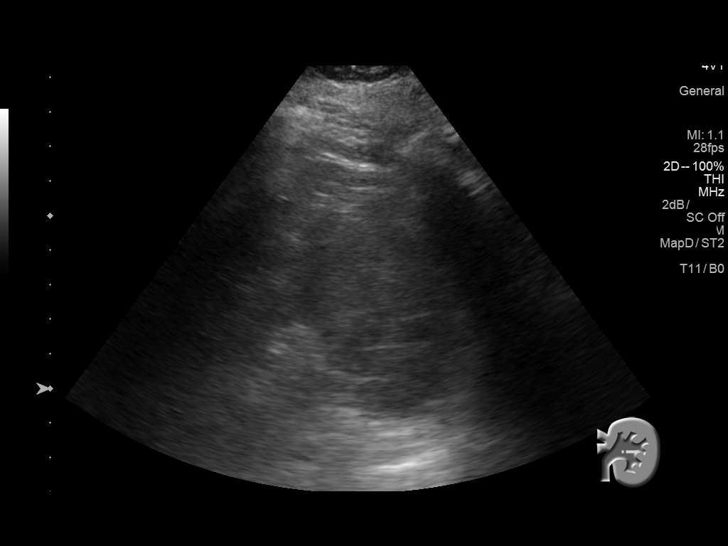
[im 43/65]
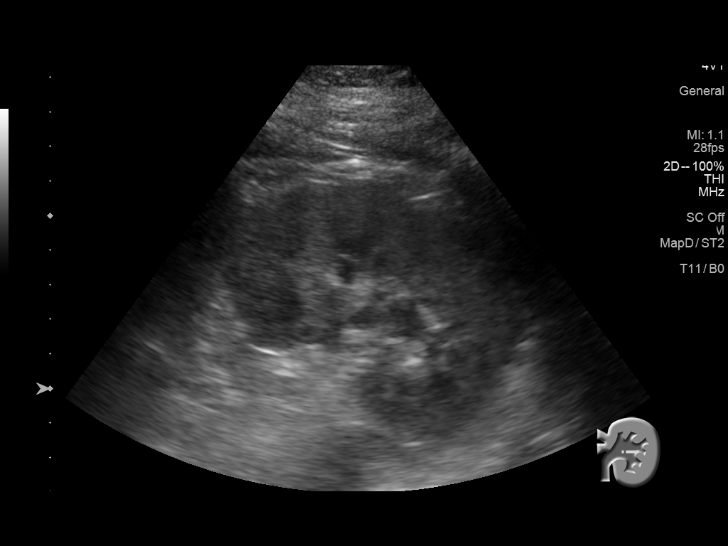
[im 49/65]
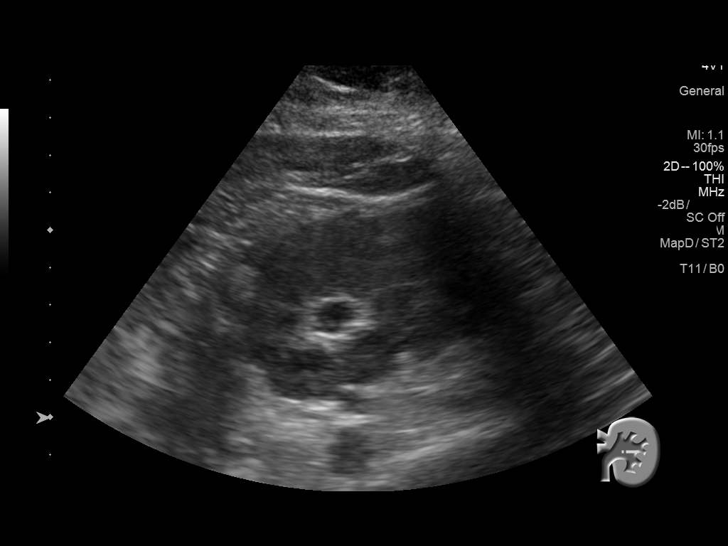
[im 54/65]
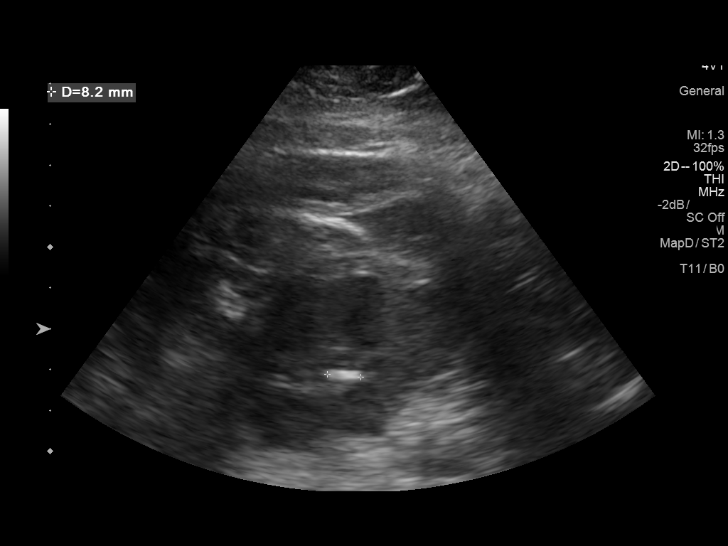
[im 59/65]
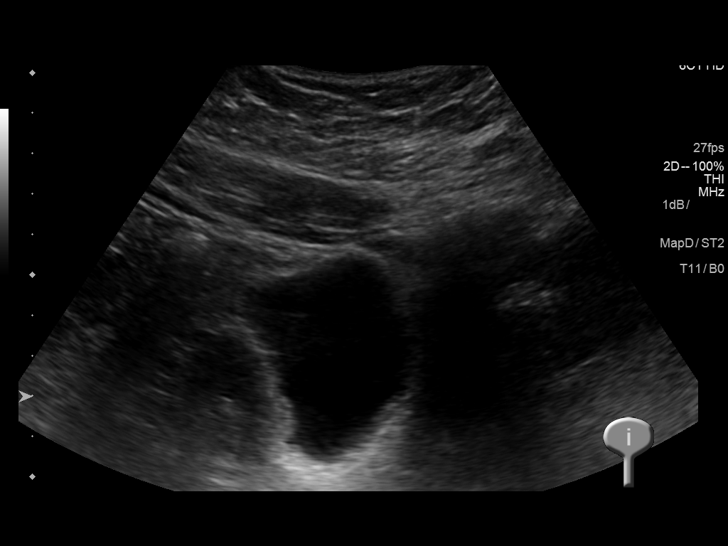
[im 65/65]
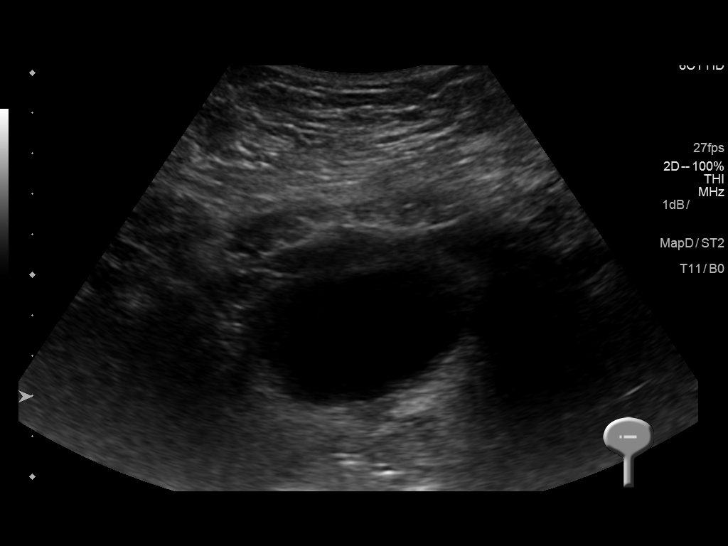

[14 of 25 positions shown; findings below may reference images not displayed]

FINDINGS: Right Kidney:

Length: 12 cm. Echogenicity within normal limits. No mass or
hydronephrosis visualized.

Left Kidney:

Length: 12.6 cm. Mild hydronephrosis. There is an 8 mm echogenic
focus over the lower pole without definite shadowing or twinkle
artifact.

Bladder:

Appears normal for degree of bladder distention. Only a right
ureteral jet was seen.
IMPRESSION: 1. Mild left hydronephrosis.
2. Equivocal for 8 mm lower pole renal calculus on the left.

## 2017-10-08 ENCOUNTER — Telehealth: Payer: Self-pay | Admitting: Adult Health

## 2017-10-08 NOTE — Telephone Encounter (Signed)
Patient called stating that she will run out of her medication before her appointment in September and would like to know if we could send a refill until then. Pt knows that Victorino DikeJennifer is on vacation and wont be in until Monday.

## 2017-10-12 NOTE — Telephone Encounter (Signed)
Left message I need top know which meds

## 2017-10-19 ENCOUNTER — Other Ambulatory Visit: Payer: Self-pay | Admitting: Adult Health

## 2017-12-02 ENCOUNTER — Encounter: Payer: Self-pay | Admitting: Adult Health

## 2017-12-02 ENCOUNTER — Ambulatory Visit: Payer: BLUE CROSS/BLUE SHIELD | Admitting: Adult Health

## 2017-12-02 VITALS — BP 112/80 | HR 101 | Ht 61.0 in | Wt 243.0 lb

## 2017-12-02 DIAGNOSIS — Z01419 Encounter for gynecological examination (general) (routine) without abnormal findings: Secondary | ICD-10-CM | POA: Diagnosis not present

## 2017-12-02 DIAGNOSIS — R319 Hematuria, unspecified: Secondary | ICD-10-CM | POA: Diagnosis not present

## 2017-12-02 DIAGNOSIS — N926 Irregular menstruation, unspecified: Secondary | ICD-10-CM

## 2017-12-02 DIAGNOSIS — R030 Elevated blood-pressure reading, without diagnosis of hypertension: Secondary | ICD-10-CM

## 2017-12-02 DIAGNOSIS — Z113 Encounter for screening for infections with a predominantly sexual mode of transmission: Secondary | ICD-10-CM | POA: Insufficient documentation

## 2017-12-02 DIAGNOSIS — M62830 Muscle spasm of back: Secondary | ICD-10-CM

## 2017-12-02 DIAGNOSIS — M62838 Other muscle spasm: Secondary | ICD-10-CM

## 2017-12-02 HISTORY — DX: Irregular menstruation, unspecified: N92.6

## 2017-12-02 LAB — POCT URINALYSIS DIPSTICK OB
Glucose, UA: NEGATIVE
KETONES UA: NEGATIVE
NITRITE UA: NEGATIVE
POC,PROTEIN,UA: NEGATIVE

## 2017-12-02 MED ORDER — NORGESTIMATE-ETH ESTRADIOL 0.25-35 MG-MCG PO TABS: 1.0000 | ORAL_TABLET | Freq: Every day | ORAL | 11 refills | Status: DC

## 2017-12-02 NOTE — Progress Notes (Signed)
Patient ID: Theresa Rosario, female   DOB: 03/03/1986, 32 y.o.   MRN: [id number] History of Present Illness: Theresa Rosario is a 32 year old black female in for well woman gyn exam, she had a normal pap with negative HPV, 10/18/15. PCP is Dr Ouida Sills.   Current Medications, Allergies, Past Medical History, Past Surgical History, Family History and Social History were reviewed in [name].    Review of Systems: Patient denies any headaches, hearing loss, fatigue, blurred vision, shortness of breath, chest pain, abdominal pain, problems with bowel movements, or intercourse. No joint pain or mood swings. +urinary frequency +spotting daily Low back pain   Physical Exam: BP 112/80 (BP Location: Left Arm, Patient Position: Sitting, Cuff Size: Large)   Pulse (!) 101   Ht 5\' 1"  (1.549 m)   Wt 243 lb (110.2 kg)   BMI 45.91 kg/m  General:  Well developed, well nourished, no acute distress Skin:  Warm and dry Neck:  Midline trachea, normal thyroid, good ROM, no lymphadenopathy Lungs; Clear to auscultation bilaterally Breast:  No dominant palpable mass, retraction, or nipple discharge,large Cardiovascular: Regular rate and rhythm Abdomen:  Soft, non tender, no hepatosplenomegaly Pelvic:  External genitalia is normal in appearance, no lesions.  The vagina is normal in appearance, +blood. Urethra has no lesions or masses. The cervix is bulbous.GC/CHL obtained.  Uterus is felt to be normal size, shape, and contour.  No adnexal masses or tenderness noted.Bladder is non tender, no masses felt. Has point tenderness right lower back area, ?spasm Extremities/musculoskeletal:  No swelling or varicosities noted, no clubbing or cyanosis Psych:  No mood changes, alert and cooperative,seems happy PHQ 2 score 1. Examination chaperoned by Malachy Mood LPN.  Impression: 1. Encounter for well woman exam with routine gynecological exam   2. Irregular bleeding   3. Screening  examination for STD (sexually transmitted disease)   4. Hematuria, unspecified type   5. Muscle spasm of back   6. Muscle spasm of both lower legs   7. Elevated BP without diagnosis of hypertension     Plan: Finish current OCs then start sprintec  Meds ordered this encounter  Medications  . DISCONTD: norgestimate-ethinyl estradiol (ORTHO-CYCLEN,SPRINTEC,PREVIFEM) 0.25-35 MG-MCG tablet    Sig: Take 1 tablet by mouth daily.    Dispense:  1 Package    Refill:  11    Order Specific Question:   Supervising Provider    Answer:   Despina Hidden, LUTHER H [[id number]]  . norgestimate-ethinyl estradiol (ORTHO-CYCLEN,SPRINTEC,PREVIFEM) 0.25-35 MG-MCG tablet    Sig: Take 1 tablet by mouth daily.    Dispense:  1 Package    Refill:  11    Order Specific Question:   Supervising Provider    Answer:   Lazaro Arms [[id number]]  GC/CHL sent Return in 1 week for GYN [name] and BP check Pap and physical in 1 year Try ice on low back Get massage or see chiropractor  Labs with PCP

## 2017-12-06 LAB — GC/CHLAMYDIA PROBE AMP
CHLAMYDIA, DNA PROBE: NEGATIVE
NEISSERIA GONORRHOEAE BY PCR: NEGATIVE

## 2017-12-07 ENCOUNTER — Telehealth: Payer: Self-pay | Admitting: *Deleted

## 2017-12-09 ENCOUNTER — Ambulatory Visit (INDEPENDENT_AMBULATORY_CARE_PROVIDER_SITE_OTHER): Payer: BLUE CROSS/BLUE SHIELD

## 2017-12-09 ENCOUNTER — Ambulatory Visit (INDEPENDENT_AMBULATORY_CARE_PROVIDER_SITE_OTHER): Payer: BLUE CROSS/BLUE SHIELD | Admitting: *Deleted

## 2017-12-09 VITALS — BP 140/94

## 2017-12-09 DIAGNOSIS — Z0130 Encounter for examination of blood pressure without abnormal findings: Secondary | ICD-10-CM

## 2017-12-09 DIAGNOSIS — N926 Irregular menstruation, unspecified: Secondary | ICD-10-CM | POA: Diagnosis not present

## 2017-12-09 NOTE — Progress Notes (Signed)
PELVIC US TA/TV: homogeneous anteverted uterus,wnl,EEC 8.4 mm,normal ovaries bilat,ovaries appear mobile,no pain during ultrasound,no free fluid

## 2017-12-09 NOTE — Progress Notes (Signed)
Pt informed that std screening are negative. In for bp check today.

## 2017-12-16 ENCOUNTER — Encounter: Payer: Self-pay | Admitting: Adult Health

## 2017-12-16 ENCOUNTER — Other Ambulatory Visit: Payer: Self-pay

## 2017-12-16 ENCOUNTER — Ambulatory Visit: Payer: BLUE CROSS/BLUE SHIELD | Admitting: Adult Health

## 2017-12-16 VITALS — BP 128/88 | HR 95 | Ht 61.0 in | Wt 245.0 lb

## 2017-12-16 DIAGNOSIS — I10 Essential (primary) hypertension: Secondary | ICD-10-CM

## 2017-12-16 MED ORDER — HYDROCHLOROTHIAZIDE 12.5 MG PO CAPS: 12.5000 mg | ORAL_CAPSULE | Freq: Every day | ORAL | 3 refills | Status: DC

## 2017-12-16 NOTE — Patient Instructions (Signed)
DASH Eating Plan DASH stands for "Dietary Approaches to Stop Hypertension." The DASH eating plan is a healthy eating plan that has been shown to reduce high blood pressure (hypertension). It may also reduce your risk for type 2 diabetes, heart disease, and stroke. The DASH eating plan may also help with weight loss. What are tips for following this plan? General guidelines  Avoid eating more than 2,300 mg (milligrams) of salt (sodium) a day. If you have hypertension, you may need to reduce your sodium intake to 1,500 mg a day.  Limit alcohol intake to no more than 1 drink a day for nonpregnant women and 2 drinks a day for men. One drink equals 12 oz of beer, 5 oz of wine, or 1 oz of hard liquor.  Work with your health care provider to maintain a healthy body weight or to lose weight. Ask what an ideal weight is for you.  Get at least 30 minutes of exercise that causes your heart to beat faster (aerobic exercise) most days of the week. Activities may include walking, swimming, or biking.  Work with your health care provider or diet and nutrition specialist (dietitian) to adjust your eating plan to your individual calorie needs. Reading food labels  Check food labels for the amount of sodium per serving. Choose foods with less than 5 percent of the Daily Value of sodium. Generally, foods with less than 300 mg of sodium per serving fit into this eating plan.  To find whole grains, look for the word "whole" as the first word in the ingredient list. Shopping  Buy products labeled as "low-sodium" or "no salt added."  Buy fresh foods. Avoid canned foods and premade or frozen meals. Cooking  Avoid adding salt when cooking. Use salt-free seasonings or herbs instead of table salt or sea salt. Check with your health care provider or pharmacist before using salt substitutes.  Do not fry foods. Cook foods using healthy methods such as baking, boiling, grilling, and broiling instead.  Cook with  heart-healthy oils, such as olive, canola, soybean, or sunflower oil. Meal planning   Eat a balanced diet that includes: ? 5 or more servings of fruits and vegetables each day. At each meal, try to fill half of your plate with fruits and vegetables. ? Up to 6-8 servings of whole grains each day. ? Less than 6 oz of lean meat, poultry, or fish each day. A 3-oz serving of meat is about the same size as a deck of cards. One egg equals 1 oz. ? 2 servings of low-fat dairy each day. ? A serving of nuts, seeds, or beans 5 times each week. ? Heart-healthy fats. Healthy fats called Omega-3 fatty acids are found in foods such as flaxseeds and coldwater fish, like sardines, salmon, and mackerel.  Limit how much you eat of the following: ? Canned or prepackaged foods. ? Food that is high in trans fat, such as fried foods. ? Food that is high in saturated fat, such as fatty meat. ? Sweets, desserts, sugary drinks, and other foods with added sugar. ? Full-fat dairy products.  Do not salt foods before eating.  Try to eat at least 2 vegetarian meals each week.  Eat more home-cooked food and less restaurant, buffet, and fast food.  When eating at a restaurant, ask that your food be prepared with less salt or no salt, if possible. What foods are recommended? The items listed may not be a complete list. Talk with your dietitian about what   dietary choices are best for you. Grains Whole-grain or whole-wheat bread. Whole-grain or whole-wheat pasta. Brown rice. Oatmeal. Quinoa. Bulgur. Whole-grain and low-sodium cereals. Pita bread. Low-fat, low-sodium crackers. Whole-wheat flour tortillas. Vegetables Fresh or frozen vegetables (raw, steamed, roasted, or grilled). Low-sodium or reduced-sodium tomato and vegetable juice. Low-sodium or reduced-sodium tomato sauce and tomato paste. Low-sodium or reduced-sodium canned vegetables. Fruits All fresh, dried, or frozen fruit. Canned fruit in natural juice (without  added sugar). Meat and other protein foods Skinless chicken or turkey. Ground chicken or turkey. Pork with fat trimmed off. Fish and seafood. Egg whites. Dried beans, peas, or lentils. Unsalted nuts, nut butters, and seeds. Unsalted canned beans. Lean cuts of beef with fat trimmed off. Low-sodium, lean deli meat. Dairy Low-fat (1%) or fat-free (skim) milk. Fat-free, low-fat, or reduced-fat cheeses. Nonfat, low-sodium ricotta or cottage cheese. Low-fat or nonfat yogurt. Low-fat, low-sodium cheese. Fats and oils Soft margarine without trans fats. Vegetable oil. Low-fat, reduced-fat, or light mayonnaise and salad dressings (reduced-sodium). Canola, safflower, olive, soybean, and sunflower oils. Avocado. Seasoning and other foods Herbs. Spices. Seasoning mixes without salt. Unsalted popcorn and pretzels. Fat-free sweets. What foods are not recommended? The items listed may not be a complete list. Talk with your dietitian about what dietary choices are best for you. Grains Baked goods made with fat, such as croissants, muffins, or some breads. Dry pasta or rice meal packs. Vegetables Creamed or fried vegetables. Vegetables in a cheese sauce. Regular canned vegetables (not low-sodium or reduced-sodium). Regular canned tomato sauce and paste (not low-sodium or reduced-sodium). Regular tomato and vegetable juice (not low-sodium or reduced-sodium). Pickles. Olives. Fruits Canned fruit in a light or heavy syrup. Fried fruit. Fruit in cream or butter sauce. Meat and other protein foods Fatty cuts of meat. Ribs. Fried meat. Bacon. Sausage. Bologna and other processed lunch meats. Salami. Fatback. Hotdogs. Bratwurst. Salted nuts and seeds. Canned beans with added salt. Canned or smoked fish. Whole eggs or egg yolks. Chicken or turkey with skin. Dairy Whole or 2% milk, cream, and half-and-half. Whole or full-fat cream cheese. Whole-fat or sweetened yogurt. Full-fat cheese. Nondairy creamers. Whipped toppings.  Processed cheese and cheese spreads. Fats and oils Butter. Stick margarine. Lard. Shortening. Ghee. Bacon fat. Tropical oils, such as coconut, palm kernel, or palm oil. Seasoning and other foods Salted popcorn and pretzels. Onion salt, garlic salt, seasoned salt, table salt, and sea salt. Worcestershire sauce. Tartar sauce. Barbecue sauce. Teriyaki sauce. Soy sauce, including reduced-sodium. Steak sauce. Canned and packaged gravies. Fish sauce. Oyster sauce. Cocktail sauce. Horseradish that you find on the shelf. Ketchup. Mustard. Meat flavorings and tenderizers. Bouillon cubes. Hot sauce and Tabasco sauce. Premade or packaged marinades. Premade or packaged taco seasonings. Relishes. Regular salad dressings. Where to find more information:  National Heart, Lung, and Blood Institute: www.nhlbi.nih.gov  American Heart Association: www.heart.org Summary  The DASH eating plan is a healthy eating plan that has been shown to reduce high blood pressure (hypertension). It may also reduce your risk for type 2 diabetes, heart disease, and stroke.  With the DASH eating plan, you should limit salt (sodium) intake to 2,300 mg a day. If you have hypertension, you may need to reduce your sodium intake to 1,500 mg a day.  When on the DASH eating plan, aim to eat more fresh fruits and vegetables, whole grains, lean proteins, low-fat dairy, and heart-healthy fats.  Work with your health care provider or diet and nutrition specialist (dietitian) to adjust your eating plan to your individual   calorie needs. This information is not intended to replace advice given to you by your health care provider. Make sure you discuss any questions you have with your health care provider. Document Released: 02/27/2011 Document Revised: 03/03/2016 Document Reviewed: 03/03/2016 Elsevier Interactive Patient Education  2018 Elsevier Inc.  

## 2017-12-16 NOTE — Progress Notes (Signed)
  Subjective:     Patient ID: Theresa Rosario, female   DOB: Mar 19, 1986, 32 y.o.   MRN: [id number]  HPI Gelena is a 32 year old black female in for a BP check, it has been elevated on several occasions.  Review of Systems Has swelling in feet and ankles at times at end of the day Reviewed past medical,surgical, social and family history. Reviewed medications and allergies.     Objective:   Physical Exam BP 128/88 (BP Location: Right Arm, Patient Position: Sitting, Cuff Size: Normal)   Pulse 95   Ht 5\' 1"  (1.549 m)   Wt 245 lb (111.1 kg)   LMP 12/13/2017   BMI 46.29 kg/m  Skin warm and dry. Lungs: clear to ausculation bilaterally. Cardiovascular: regular rate and rhythm.    Assessment:     1. Essential hypertension       Plan:     Meds ordered this encounter  Medications  . hydrochlorothiazide (MICROZIDE) 12.5 MG capsule    Sig: Take 1 capsule (12.5 mg total) by mouth daily.    Dispense:  30 capsule    Refill:  3    Order Specific Question:   Supervising Provider    Answer:   Duane Lope H [[id number]]  Review DASH diet, decrease sugar and salt F/U in 4 weeks for BP check with me

## 2018-01-07 ENCOUNTER — Emergency Department (HOSPITAL_COMMUNITY)
Admission: EM | Admit: 2018-01-07 | Discharge: 2018-01-07 | Disposition: A | Discharge status: Home / Self Care | Payer: BLUE CROSS/BLUE SHIELD | Attending: Emergency Medicine | Admitting: Emergency Medicine

## 2018-01-07 ENCOUNTER — Encounter (HOSPITAL_COMMUNITY): Payer: Self-pay

## 2018-01-07 ENCOUNTER — Other Ambulatory Visit: Payer: Self-pay

## 2018-01-07 DIAGNOSIS — N611 Abscess of the breast and nipple: Secondary | ICD-10-CM | POA: Insufficient documentation

## 2018-01-07 DIAGNOSIS — Z79899 Other long term (current) drug therapy: Secondary | ICD-10-CM | POA: Diagnosis not present

## 2018-01-07 DIAGNOSIS — R222 Localized swelling, mass and lump, trunk: Secondary | ICD-10-CM | POA: Diagnosis present

## 2018-01-07 DIAGNOSIS — N189 Chronic kidney disease, unspecified: Secondary | ICD-10-CM | POA: Insufficient documentation

## 2018-01-07 HISTORY — DX: Migraine, unspecified, not intractable, without status migrainosus: G43.909

## 2018-01-07 MED ORDER — MUPIROCIN CALCIUM 2 % EX CREA: 1.0000 "application " | TOPICAL_CREAM | Freq: Two times a day (BID) | CUTANEOUS | 0 refills | Status: DC

## 2018-01-07 MED ORDER — BACITRACIN-NEOMYCIN-POLYMYXIN 400-5-5000 EX OINT
TOPICAL_OINTMENT | Freq: Once | CUTANEOUS | Status: AC
  Administered 2018-01-07: 1 via TOPICAL
  Filled 2018-01-07: qty 1

## 2018-01-07 MED ORDER — FLUCONAZOLE 150 MG PO TABS: 150.0000 mg | ORAL_TABLET | Freq: Every day | ORAL | 0 refills | Status: AC

## 2018-01-07 MED ORDER — SULFAMETHOXAZOLE-TRIMETHOPRIM 800-160 MG PO TABS: 1.0000 | ORAL_TABLET | Freq: Two times a day (BID) | ORAL | 0 refills | Status: AC

## 2018-01-07 NOTE — ED Provider Notes (Signed)
[facility] EMERGENCY DEPARTMENT Provider Note   CSN: [id number] Arrival date & time: 01/07/18  0754     History   Chief Complaint Chief Complaint  Patient presents with  . Abscess    HPI Theresa Rosario is a 32 y.o. female who reports occasional abscesses, developed a larger than normal abscess on her medial inferior right breast 7 days ago.  She has applied warm compresses and the site has drained, first purulent drainage, now just a trace of blood.  The pain is improved also but she has concerns about the size of the lesion and continued redness around the infection site. She has been applying a bandage after cleaning the area with hydrogen peroxide.  She denies fevers or chills, no other complaint.  The history is provided by the patient.    Past Medical History:  Diagnosis Date  . Blood in urine 10/18/2015  . Chronic kidney disease    stones  . Fibromyalgia   . Hx of hematuria   . Hx of ovarian cyst   . Large breasts   . Mental disorder    depression  . Migraine     Patient Active Problem List   Diagnosis Date Noted  . Muscle spasm of both lower legs 12/02/2017  . Muscle spasm of back 12/02/2017  . Elevated BP without diagnosis of hypertension 12/02/2017  . Irregular bleeding 12/02/2017  . Screening examination for STD (sexually transmitted disease) 12/02/2017  . Encounter for well woman exam with routine gynecological exam 12/02/2017  . Weight gain 10/23/2016  . Well woman exam with routine gynecological exam 10/23/2016  . Blood in urine 10/18/2015  . Contraceptive management 10/18/2015  . Condyloma of female genitalia 10/21/2012  . Fibromyalgia 07/13/2012  . Anxiety 07/13/2012  . Hx of hematuria 06/29/2012  . Lumbago 11/11/2011    Past Surgical History:  Procedure Laterality Date  . DENTAL SURGERY    . WISDOM TOOTH EXTRACTION Bilateral      OB History    Gravida  1   Para  1   Term  1   Preterm      AB      Living  1     SAB        TAB      Ectopic      Multiple      Live Births  1            Home Medications    Prior to Admission medications   Medication Sig Start Date End Date Taking? Authorizing Provider  acetaminophen (TYLENOL) 500 MG tablet Take 1,000 mg by mouth every 8 (eight) hours as needed for mild pain.     [provider]  baclofen (LIORESAL) 10 MG tablet Take 10 mg by mouth 2 (two) times daily as needed. Muscle spasm 09/27/14   [provider]  cyclobenzaprine (FLEXERIL) 10 MG tablet Take 1 tablet (10 mg total) by mouth 2 (two) times daily as needed for muscle spasms. Patient taking differently: Take 20 mg by mouth daily.  01/01/15   Mackuen, Courteney Lyn, MD  escitalopram (LEXAPRO) 10 MG tablet Take 10 mg by mouth daily.    [provider]  etodolac (LODINE) 400 MG tablet Take 400 mg by mouth 2 (two) times daily.    [provider]  fluconazole (DIFLUCAN) 150 MG tablet Take 1 tablet (150 mg total) by mouth daily for 1 day. As needed for development of vaginitis 01/07/18 01/08/18  Burgess Amor, PA-C  hydrochlorothiazide (MICROZIDE) 12.5 MG capsule Take 1 capsule (12.5 mg total) by mouth daily. 12/16/17   Adline Potter, NP  LORazepam (ATIVAN) 0.5 MG tablet Take 0.5 mg by mouth every 8 (eight) hours as needed for anxiety.    [provider]  mupirocin cream (BACTROBAN) 2 % Apply 1 application topically 2 (two) times daily. 01/07/18   Burgess Amor, PA-C  norgestimate-ethinyl estradiol (ORTHO-CYCLEN,SPRINTEC,PREVIFEM) 0.25-35 MG-MCG tablet Take 1 tablet by mouth daily. 12/02/17   Adline Potter, NP  phentermine (ADIPEX-P) 37.5 MG tablet Take 37.5 mg by mouth daily. 11/17/17   [provider]  sulfamethoxazole-trimethoprim (BACTRIM DS,SEPTRA DS) 800-160 MG tablet Take 1 tablet by mouth 2 (two) times daily for 10 days. 01/07/18 01/17/18  Burgess Amor, PA-C  zonisamide (ZONEGRAN) 100 MG capsule Take 100 mg by mouth daily.  11/19/17   [provider]    Family History Family History  Problem Relation Age of Onset  . Cancer Mother 38       colon  . Diabetes Mother   . Hypertension Mother   . Hypertension Father   . Heart disease Maternal Grandfather   . Coronary artery disease Other   . Diabetes Other   . Diabetes Paternal Grandmother   . Hypertension Paternal Grandmother   . Diabetes Maternal Grandmother     Social History Social History   Tobacco Use  . Smoking status: Never Smoker  . Smokeless tobacco: Never Used  Substance Use Topics  . Alcohol use: Yes    Comment: socially  . Drug use: No     Allergies   Motrin [ibuprofen]   Review of Systems Review of Systems  Constitutional: Negative for chills and fever.  Respiratory: Negative for shortness of breath and wheezing.   Skin: Positive for color change and wound.     Physical Exam Updated Vital Signs BP (!) 121/97 (BP Location: Left Arm)   Pulse 90   Temp 98.3 F (36.8 C) (Oral)   Resp 16   Ht 5\' 1"  (1.549 m)   Wt 110.2 kg   LMP 12/13/2017   SpO2 100%   BMI 45.91 kg/m   Physical Exam  Constitutional: She is oriented to person, place, and time. She appears well-developed and well-nourished.  HENT:  Head: Normocephalic.  Cardiovascular: Normal rate.  Pulmonary/Chest: Effort normal.  Neurological: She is alert and oriented to person, place, and time. No sensory deficit.  Skin:  2 cm open shallow based ulceration right inferior medial breast.  The ulcer is approx 4 mm depth, base is moist with healthy looking pink granulation tissue.  No induration, no purulent drainage. Subtle erythema 3 cm surrounding ulceration.   No obvious deep induration, mass or nodules.      ED Treatments / Results  Labs (all labs ordered are listed, but only abnormal results are displayed) Labs Reviewed - No data to display  EKG None  Radiology No results found.  Procedures Procedures (including critical care time)  Medications Ordered in  ED Medications  neomycin-bacitracin-polymyxin (NEOSPORIN) ointment (1 application Topical Given 01/07/18 0903)     Initial Impression / Assessment and Plan / ED Course  I have reviewed the triage vital signs and the nursing notes.  Pertinent labs & imaging results that were available during my care of the patient were reviewed by me and considered in my medical decision making (see chart for details).     Exam and history c/w simple cutaneous abscess, however, [facility]  for further evaluation, esp if sx persist or does not heal completely. No exam findings today to suggest this is a breast cancer.  She was placed on mupirocin ointment (cream prescribed, but pharmacy called to switch to ointment which was approved).  Bactrim, warm water soaks, dressings.  Pt endorses yeast infections with abx use. Diflucan for prn use if she develops such sx.   Final Clinical Impressions(s) / ED Diagnoses   Final diagnoses:  Breast abscess    ED Discharge Orders         Ordered    mupirocin cream (BACTROBAN) 2 %  2 times daily     01/07/18 0854    sulfamethoxazole-trimethoprim (BACTRIM DS,SEPTRA DS) 800-160 MG tablet  2 times daily     01/07/18 0854    fluconazole (DIFLUCAN) 150 MG tablet  Daily     01/07/18 0855           Burgess Amor, PA-C 01/07/18 1628    Samuel Jester, DO 01/09/18 [phone]

## 2018-01-07 NOTE — ED Triage Notes (Signed)
Pt reports has abscess under r breast since Friday.  Reports it has been draining since Monday.

## 2018-01-07 NOTE — Discharge Instructions (Addendum)
Complete the entire course of the antibiotic prescribed. Apply the antibiotic cream to your abscess site after a warm water soak (or shower water therapy as discussed) twice daily before applying a new dressing.

## 2018-01-14 LAB — GLUCOSE, POCT (MANUAL RESULT ENTRY): POC Glucose: 72 mg/dl (ref 70–99)

## 2018-01-18 ENCOUNTER — Encounter: Payer: Self-pay | Admitting: Adult Health

## 2018-01-18 ENCOUNTER — Ambulatory Visit: Payer: BLUE CROSS/BLUE SHIELD | Admitting: Adult Health

## 2018-01-18 VITALS — BP 115/80 | HR 101 | Ht 61.0 in | Wt 241.0 lb

## 2018-01-18 DIAGNOSIS — I10 Essential (primary) hypertension: Secondary | ICD-10-CM | POA: Diagnosis not present

## 2018-01-18 MED ORDER — HYDROCHLOROTHIAZIDE 12.5 MG PO CAPS: 12.5000 mg | ORAL_CAPSULE | Freq: Every day | ORAL | 12 refills | Status: DC

## 2018-01-18 NOTE — Progress Notes (Signed)
  Subjective:     Patient ID: Theresa Rosario, female   DOB: 07-01-1985, 32 y.o.   MRN: [id number]  HPI Theresa Rosario is a 32 year old black female in for a BP check, since starting microzide in September.   Review of Systems Seems to have less swelling. Reviewed past medical,surgical, social and family history. Reviewed medications and allergies.     Objective:   Physical Exam BP 115/80 (BP Location: Left Arm, Patient Position: Sitting, Cuff Size: Normal)   Pulse (!) 101   Ht 5\' 1"  (1.549 m)   Wt 241 lb (109.3 kg)   BMI 45.54 kg/m  Skin warm and dry.  Lungs: clear to ausculation bilaterally. Cardiovascular: regular rate and rhythm.   No swelling in ankles, and has lost 4 lbs. Will continue Microzide and praised over weight loss.  Assessment:     1. Essential hypertension       Plan:     Meds ordered this encounter  Medications  . hydrochlorothiazide (MICROZIDE) 12.5 MG capsule    Sig: Take 1 capsule (12.5 mg total) by mouth daily.    Dispense:  30 capsule    Refill:  12    Order Specific Question:   Supervising Provider    Answer:   Duane Lope H [[id number]]  F/U in 3 months

## 2018-04-20 ENCOUNTER — Encounter: Payer: Self-pay | Admitting: Adult Health

## 2018-04-20 ENCOUNTER — Ambulatory Visit (INDEPENDENT_AMBULATORY_CARE_PROVIDER_SITE_OTHER): Payer: PRIVATE HEALTH INSURANCE | Admitting: Adult Health

## 2018-04-20 VITALS — BP 120/80 | HR 94 | Ht 61.2 in | Wt 237.0 lb

## 2018-04-20 DIAGNOSIS — I10 Essential (primary) hypertension: Secondary | ICD-10-CM | POA: Diagnosis not present

## 2018-04-20 NOTE — Progress Notes (Signed)
Patient ID: Theresa Rosario, female   DOB: June 09, 1985, 33 y.o.   MRN: [id number] History of Present Illness: Theresa Rosario is a 33 year old black female in for BP check. PCP is Dr Ouida Sills.    Current Medications, Allergies, Past Medical History, Past Surgical History, Family History and Social History were reviewed in [name].     Review of Systems: Feels like right hand and foot swells at times    Physical Exam:BP 120/80 (BP Location: Right Arm, Patient Position: Sitting, Cuff Size: Large)   Pulse 94   Ht 5' 1.2" (1.554 m)   Wt 237 lb (107.5 kg)   LMP 04/18/2018   BMI 44.49 kg/m   Has lost 4 more lbs. General:  Well developed, well nourished, no acute distress Skin:  Warm and dry Lungs; Clear to auscultation bilaterally Cardiovascular: Regular rate and rhythm Extremities/musculoskeletal:  No swelling or varicosities noted, no clubbing or cyanosis Psych:  No mood changes, alert and cooperative,seems happy   Impression:  1. Essential hypertension      Plan: Continue Microzide Keep working on weight F/U in 3 months

## 2018-05-24 NOTE — Telephone Encounter (Signed)
Note sent to nurse. 

## 2018-07-19 ENCOUNTER — Encounter: Payer: Self-pay | Admitting: *Deleted

## 2018-07-20 ENCOUNTER — Ambulatory Visit: Payer: Self-pay | Admitting: Adult Health

## 2018-08-12 ENCOUNTER — Encounter: Payer: Self-pay | Admitting: *Deleted

## 2018-08-12 ENCOUNTER — Telehealth: Payer: Self-pay | Admitting: Neurology

## 2018-08-12 NOTE — Telephone Encounter (Signed)
Pt gave consent for video visit.  Pt understands that although there may be some limitations with this type of visit, we will take all precautions to reduce any security or privacy concerns.  Pt understands that this will be treated like an in office visit and we will file with pt's insurance, and there may be a patient responsible charge related to this service. °

## 2018-08-17 ENCOUNTER — Other Ambulatory Visit: Payer: Self-pay

## 2018-08-17 ENCOUNTER — Ambulatory Visit (INDEPENDENT_AMBULATORY_CARE_PROVIDER_SITE_OTHER): Payer: PRIVATE HEALTH INSURANCE | Admitting: Neurology

## 2018-08-17 ENCOUNTER — Telehealth: Payer: Self-pay | Admitting: Neurology

## 2018-08-17 DIAGNOSIS — IMO0002 Reserved for concepts with insufficient information to code with codable children: Secondary | ICD-10-CM

## 2018-08-17 DIAGNOSIS — G43709 Chronic migraine without aura, not intractable, without status migrainosus: Secondary | ICD-10-CM

## 2018-08-17 DIAGNOSIS — G43909 Migraine, unspecified, not intractable, without status migrainosus: Secondary | ICD-10-CM | POA: Insufficient documentation

## 2018-08-17 MED ORDER — ZONISAMIDE 100 MG PO CAPS: 200.0000 mg | ORAL_CAPSULE | Freq: Every day | ORAL | 11 refills | Status: DC

## 2018-08-17 MED ORDER — ERENUMAB-AOOE 70 MG/ML ~~LOC~~ SOAJ: 70.0000 mg | SUBCUTANEOUS | 11 refills | Status: DC

## 2018-08-17 MED ORDER — PROPRANOLOL HCL ER 60 MG PO CP24: 60.0000 mg | ORAL_CAPSULE | Freq: Every day | ORAL | 11 refills | Status: DC

## 2018-08-17 MED ORDER — ONDANSETRON 4 MG PO TBDP: 4.0000 mg | ORAL_TABLET | Freq: Three times a day (TID) | ORAL | 6 refills | Status: DC | PRN

## 2018-08-17 MED ORDER — RIZATRIPTAN BENZOATE 10 MG PO TBDP: 10.0000 mg | ORAL_TABLET | ORAL | 11 refills | Status: DC | PRN

## 2018-08-17 NOTE — Telephone Encounter (Signed)
error °

## 2018-08-17 NOTE — Progress Notes (Signed)
PATIENT: Theresa Rosario DOB: 12-Nov-1985  Virtual Visit via video  I connected with Zawadi T Rack on 08/17/18 at  by video and verified that I am speaking with the correct person using two identifiers.   I discussed the limitations, risks, security and privacy concerns of performing an evaluation and management service by video and the availability of in person appointments. I also discussed with the patient that there may be a patient responsible charge related to this service. The patient expressed understanding and agreed to proceed.  HISTORICAL  Theresa RavensMontrese T Bernet is a [age] years old female, seen in request by her primary care physician Dr. Juanetta GoslingHawkins, Ramon DredgeEdward for evaluation of chronic migraine  I have reviewed and summarized the referring note from the referring physician.  She had past medical history of hypertension, chronic migraine headaches  She reported history of chronic migraine since middle school, her typical migraine are lateralized severe pounding headaches, sometimes stabbing pain with associated light noise sensitivity, nauseous, it can last for few days,  Previously she has tried Imitrex, not sure about the benefit, was recently given Fioricet, only provide limited help  Her migraine comes in clusters, sometimes can go weeks without migraine, recent couple months, she been having daily headaches, was giving a week prednisone tapering since May 21, with limited help,  She did have a history of kidney stone, has been taking Zonegran 100 mg 2 tablets every night since 2017 initially it has helped her headache, is no longer effective.  She was also treated with Lexapro for depression fibromyalgia.   Observations/Objective: I have reviewed problem lists, medications, allergies.  Awake, alert, oriented to history taking casual conversation.  Assessment and Plan: Chronic migraine headaches  Continue current preventive medications Zonegran 100 mg 2 tablets every  night,  Will also try propanolol LA 60 mg daily  Aimovig 70 mg subcutaneous every month  Maxalt as needed, may mix with NSAIDs, and Zofran  Follow Up Instructions:   2 months with nurse practitioner Maralyn SagoSarah   I discussed the assessment and treatment plan with the patient. The patient was provided an opportunity to ask questions and all were answered. The patient agreed with the plan and demonstrated an understanding of the instructions.   The patient was advised to call back or seek an in-person evaluation if the symptoms worsen or if the condition fails to improve as anticipated.  I provided 30 minutes of non-face-to-face time during this encounter.  REVIEW OF SYSTEMS: Full 14 system review of systems performed and notable only for as above All other review of systems were negative.  ALLERGIES: Allergies  Allergen Reactions  . Motrin [Ibuprofen]     Rash     HOME MEDICATIONS: Current Outpatient Medications  Medication Sig Dispense Refill  . acetaminophen (TYLENOL) 500 MG tablet Take 1,000 mg by mouth every 8 (eight) hours as needed for mild pain.     . Butalbital-APAP-Caffeine (FIORICET) 50-300-40 MG CAPS Take 1 tablet by mouth as needed.    . cyclobenzaprine (FLEXERIL) 10 MG tablet Take 20 mg by mouth at bedtime.    Marland Kitchen. escitalopram (LEXAPRO) 20 MG tablet Take 1 tablet by mouth daily.    Marland Kitchen. etodolac (LODINE) 400 MG tablet Take 400 mg by mouth 2 (two) times daily.    . hydrochlorothiazide (MICROZIDE) 12.5 MG capsule Take 1 capsule (12.5 mg total) by mouth daily. 30 capsule 12  . LORazepam (ATIVAN) 0.5 MG tablet Take 0.5 mg by mouth every 8 (eight) hours as needed  for anxiety.    . phentermine (ADIPEX-P) 37.5 MG tablet Take 37.5 mg by mouth daily.  1  . predniSONE (DELTASONE) 10 MG tablet Take 10 mg by mouth daily with breakfast. Tapering pack to be completed on 08/17/2018 (for headaches).    . zonisamide (ZONEGRAN) 100 MG capsule Take 200 mg by mouth at bedtime.   5   No current  facility-administered medications for this visit.     PAST MEDICAL HISTORY: Past Medical History:  Diagnosis Date  . Blood in urine 10/18/2015  . Fibromyalgia   . Hx of hematuria   . Hx of ovarian cyst   . Kidney stone   . Large breasts   . Mental disorder    depression  . Migraine     PAST SURGICAL HISTORY: Past Surgical History:  Procedure Laterality Date  . DENTAL SURGERY    . WISDOM TOOTH EXTRACTION Bilateral     FAMILY HISTORY: Family History  Problem Relation Age of Onset  . Cancer Mother 62       colon  . Diabetes Mother   . Hypertension Mother   . Hypertension Father   . Heart disease Maternal Grandfather   . Coronary artery disease Other   . Diabetes Other   . Diabetes Paternal Grandmother   . Hypertension Paternal Grandmother   . Diabetes Maternal Grandmother     SOCIAL HISTORY:   Social History   Socioeconomic History  . Marital status: Single    Spouse name: Not on file  . Number of children: 1  . Years of education: college  . Highest education level: Master's degree (e.g., MA, MS, MEng, MEd, MSW, MBA)  Occupational History  . 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 Financial resource strain: Not on file  . Food insecurity:    Worry: Not on file    Inability: Not on file  . Transportation needs:    Medical: Not on file    Non-medical: Not on file  Tobacco Use  . Smoking status: Never Smoker  . Smokeless tobacco: Never Used  Substance and Sexual Activity  . Alcohol use: Yes    Comment: socially  . Drug use: No  . Sexual activity: Yes    Birth control/protection: None  Lifestyle  . Physical activity:    Days per week: Not on file    Minutes per session: Not on file  . Stress: Not on file  Relationships  . Social connections:    Talks on phone: Not on file    Gets together: Not on file    Attends religious service: Not on file    Active member of club or organization: Not on file    Attends meetings  of clubs or organizations: Not on file    Relationship status: Not on file  . Intimate partner violence:    Fear of current or ex partner: Not on file    Emotionally abused: Not on file    Physically abused: Not on file    Forced sexual activity: Not on file  Other Topics Concern  . Not on file  Social History Narrative   Lives at home with boyfriend and daughter.   Right-handed.   2-3 cups caffeine per day.    Levert Feinstein, M.D. Ph.D.  [facility] [address], [address] [place], [place] [address] Ph: [phone] Fax: [phone]  CC: Kari Baars, MD

## 2018-08-17 NOTE — Telephone Encounter (Signed)
Patiet called at 10pm. Saw Dr. Terrace Arabia today, no new changes since being seen, the headache is stable just not better. Patient tried odansetron and rizatriptan - repeated the rizatriptan in 2 hours. She also took propranolol and zonisamide. She also tried flexeril, fioricet, etodolac. No red flags or focal deficits, patient sounded alert and in no acute distress on the phone, oriented and a good historian. I recommended she may want to try benadryl (which she has in the house) and repeat the zofran q8 and the tylenol as directed on the bottle up to 4000mg  a day (advised her fioricet has tylenol so to make sure to include that in totals)  otherwise since it is 11pm it is hard to do anything further this evening for her, she can go to the ER or wait for Dr. Terrace Arabia to call her back in the morning.

## 2018-08-18 ENCOUNTER — Encounter: Payer: Self-pay | Admitting: Neurology

## 2018-08-18 ENCOUNTER — Telehealth: Payer: Self-pay | Admitting: *Deleted

## 2018-08-18 NOTE — Telephone Encounter (Signed)
PA for Aimovig started on covermymeds (key: [id number]).  Pt has coverage with [employer] [id number]).  Pt HW#[id number].  Decision pending.

## 2018-08-18 NOTE — Telephone Encounter (Signed)
I have called patient,  Complains 2 weeks history of daily headache, has tried Maxalt, which helps her headache some, but still present  I have suggested her to use combination of Maxalt, Aleve, Zofran, Flexeril, even Benadryl as needed for home cocktail  If she continue have headaches, she will call back early next week, we may consider nerve block

## 2018-08-18 NOTE — Telephone Encounter (Signed)
Email from patient today:  My migraines keep me from sleeping and eating well. They are worse when i try to rest or eat. It feels like head will bust or nose will bleed. Face hurts to touch. The rizatriptan does not last long. Is it possible i may be having something else since im not getting much relief? Ive tried heat and ice. Is there anything I can do different?

## 2018-08-18 NOTE — Telephone Encounter (Signed)
I failed to reach patient, left message.

## 2018-08-18 NOTE — Telephone Encounter (Signed)
Please check on her headaches.

## 2018-08-23 ENCOUNTER — Other Ambulatory Visit: Payer: Self-pay | Admitting: *Deleted

## 2018-08-23 NOTE — Telephone Encounter (Signed)
The patient reports that she has also tried topiramate in the past.  She has coverage with [facility] through [employer] [place].  I have reached out to their appeal department (ph: [phone], fax: [phone]).  This updated information has been provided and a reconsideration of coverage is pending.

## 2018-08-23 NOTE — Telephone Encounter (Signed)
Her insurance plan has now instructed [name] to fax the appeal in writing and mark it expedited.  This request has been completed and faxed back to them at [phone].  Receipt confirmed.

## 2018-08-23 NOTE — Telephone Encounter (Signed)
Aimovig was denied by [facility].   Per the denial letter, her plan requires the following:  Failure of at least 2 of the following oral migraine preventative therapies, each for 8 weeks and from different therapeutic classes:  antiepileptic drugs ( divalproex sodium, topriamate), beta-blockers (metaprolol, propranolol, timolol), antidepressants (amitriptyline, venlafaxine).  She is currently being prescribed propranolol ER.  I have left the patient a message requesting her to call me back.  We will need to know if she has tried any of the above listed medications in the past.  If so, then Aimovig will be approved.  If not, then we will need to discuss a medication change with Dr. Terrace Arabia.

## 2018-08-24 NOTE — Telephone Encounter (Signed)
I received a call from [employer] who informed me that the patient's Aimovig has now been approved.  Authorization #[id number] Approval valid through 08/23/2019.  I called the patient and informed her of this information.

## 2018-08-26 ENCOUNTER — Encounter: Payer: Self-pay | Admitting: Adult Health

## 2018-08-26 ENCOUNTER — Other Ambulatory Visit: Payer: PRIVATE HEALTH INSURANCE

## 2018-08-26 ENCOUNTER — Encounter: Payer: PRIVATE HEALTH INSURANCE | Admitting: Adult Health

## 2018-08-26 ENCOUNTER — Encounter: Payer: Self-pay | Admitting: *Deleted

## 2018-08-26 ENCOUNTER — Other Ambulatory Visit: Payer: Self-pay

## 2018-08-26 DIAGNOSIS — O4690 Antepartum hemorrhage, unspecified, unspecified trimester: Secondary | ICD-10-CM

## 2018-08-27 ENCOUNTER — Ambulatory Visit (INDEPENDENT_AMBULATORY_CARE_PROVIDER_SITE_OTHER): Payer: PRIVATE HEALTH INSURANCE | Admitting: Adult Health

## 2018-08-27 ENCOUNTER — Encounter: Payer: Self-pay | Admitting: Adult Health

## 2018-08-27 VITALS — Ht 61.0 in | Wt 233.0 lb

## 2018-08-27 DIAGNOSIS — Z3A01 Less than 8 weeks gestation of pregnancy: Secondary | ICD-10-CM | POA: Insufficient documentation

## 2018-08-27 DIAGNOSIS — O3680X0 Pregnancy with inconclusive fetal viability, not applicable or unspecified: Secondary | ICD-10-CM

## 2018-08-27 DIAGNOSIS — Z3201 Encounter for pregnancy test, result positive: Secondary | ICD-10-CM

## 2018-08-27 LAB — BETA HCG QUANT (REF LAB): hCG Quant: 8430 m[IU]/mL

## 2018-08-27 MED ORDER — PROMETHAZINE HCL 25 MG PO TABS: 25.0000 mg | ORAL_TABLET | Freq: Four times a day (QID) | ORAL | 1 refills | Status: DC | PRN

## 2018-08-27 MED ORDER — PREPLUS 27-1 MG PO TABS: 1.0000 | ORAL_TABLET | Freq: Every day | ORAL | 12 refills | Status: DC

## 2018-08-27 NOTE — Progress Notes (Signed)
Patient ID: Theresa Rosario, female   DOB: 1985-04-08, 33 y.o.   MRN: [id number]   TELEHEALTH VIRTUAL GYNECOLOGY VISIT ENCOUNTER NOTE  I connected with Theresa Rosario on 08/27/18 at 10:15 AM EDT by telephone at home and verified that I am speaking with the correct person using two identifiers.   I discussed the limitations, risks, security and privacy concerns of performing an evaluation and management service by telephone and the availability of in person appointments. I also discussed with the patient that there may be a patient responsible charge related to this service. The patient expressed understanding and agreed to proceed.   History:  Theresa Rosario is a 33 y.o. G2P1001 black female, single, being evaluated today for having missed a period and had +HPT and had spotting yesterday, and had QHCG then, so spotting now, does have nausea, her LMP was 07/24/18 so about 4+6 weeks with EDD 04/30/19. She denies any abnormal vaginal discharge, bleeding, pelvic pain or other concerns.    PCP is Ed Juanetta GoslingHawkins.    Past Medical History:  Diagnosis Date  . Blood in urine 10/18/2015  . Fibromyalgia   . Hx of hematuria   . Hx of ovarian cyst   . Kidney stone   . Large breasts   . Mental disorder    depression  . Migraine    Past Surgical History:  Procedure Laterality Date  . DENTAL SURGERY    . WISDOM TOOTH EXTRACTION Bilateral    The following portions of the patient's history were reviewed and updated as appropriate: allergies, current medications, past family history, past medical history, past social history, past surgical history and problem list.   Health Maintenance:  Normal pap and negative HRHPV on  10/18/15.  Review of Systems:  Pertinent items noted in HPI and remainder of comprehensive ROS otherwise negative.  Physical Exam:   General:  Alert, oriented and cooperative.   Mental Status: Normal mood and affect perceived. Normal judgment and thought content.  Physical exam  deferred due to nature of the encounter Ht 5\' 1"  (1.549 m)   Wt 233 lb (105.7 kg)   LMP 07/24/2018   BMI 44.02 kg/m per pt. Fall risk is low.   Labs and Imaging Results for orders placed or performed in visit on 08/26/18 (from the past 336 hour(s))  Beta hCG quant (ref lab)   Collection Time: 08/26/18  3:31 PM  Result Value Ref Range   hCG Quant 8,430 mIU/mL   No results found.    Assessment and Plan:     1. Positive pregnancy test +HPT and QHCG was 8,430 yesterday   2. Less than [redacted] weeks gestation of pregnancy -eat often -will rx phenergan and PNV Meds ordered this encounter  Medications  . Prenatal Vit-Fe Fumarate-FA (PREPLUS) 27-1 MG TABS    Sig: Take 1 tablet by mouth daily.    Dispense:  30 tablet    Refill:  12    Order Specific Question:   Supervising Provider    Answer:   Despina HiddenEURE, LUTHER H [[id number]]  . promethazine (PHENERGAN) 25 MG tablet    Sig: Take 1 tablet (25 mg total) by mouth every 6 (six) hours as needed for nausea or vomiting.    Dispense:  30 tablet    Refill:  1    Order Specific Question:   Supervising Provider    Answer:   Despina HiddenEURE, LUTHER H [[id number]]    3. Encounter to determine fetal viability of pregnancy, single or unspecified fetus Will  get dating [name] in 3 weeks  - US OB Comp Less 14 Wks; Future       I discussed the assessment and treatment plan with the patient. The patient was provided an opportunity to ask questions and all were answered. The patient agreed with the plan and demonstrated an understanding of the instructions.   The patient was advised to call back or seek an in-person evaluation/go to the ED if the symptoms worsen or if the condition fails to improve as anticipated.  I provided 8 minutes of non-face-to-face time during this encounter.   Cyril Mourning, NP [facility] [employer], [facility]

## 2018-08-27 NOTE — Progress Notes (Signed)
This encounter was created in error - please disregard.

## 2018-09-17 ENCOUNTER — Other Ambulatory Visit: Payer: Self-pay | Admitting: Adult Health

## 2018-09-17 ENCOUNTER — Encounter: Payer: Self-pay | Admitting: Women's Health

## 2018-09-17 ENCOUNTER — Ambulatory Visit (INDEPENDENT_AMBULATORY_CARE_PROVIDER_SITE_OTHER): Payer: PRIVATE HEALTH INSURANCE

## 2018-09-17 ENCOUNTER — Ambulatory Visit (INDEPENDENT_AMBULATORY_CARE_PROVIDER_SITE_OTHER): Payer: PRIVATE HEALTH INSURANCE | Admitting: Women's Health

## 2018-09-17 ENCOUNTER — Other Ambulatory Visit: Payer: Self-pay

## 2018-09-17 VITALS — BP 124/88 | HR 83

## 2018-09-17 DIAGNOSIS — Z3A08 8 weeks gestation of pregnancy: Secondary | ICD-10-CM

## 2018-09-17 DIAGNOSIS — O3680X0 Pregnancy with inconclusive fetal viability, not applicable or unspecified: Secondary | ICD-10-CM

## 2018-09-17 DIAGNOSIS — O021 Missed abortion: Secondary | ICD-10-CM | POA: Diagnosis not present

## 2018-09-17 MED ORDER — MISOPROSTOL 200 MCG PO TABS: 800.0000 ug | ORAL_TABLET | Freq: Once | ORAL | 1 refills | Status: DC

## 2018-09-17 NOTE — Patient Instructions (Signed)
FACTS YOU SHOULD KNOW  °About Early Pregnancy Loss ° °WHAT IS AN EARLY PREGNANCY LOSS? °Once the egg is fertilized with the sperm and begins to develop, it attaches to the lining of the uterus. This early pregnancy tissue may not develop into an embryo (the beginning stage of a baby). Sometimes an embryo does develop but does not continue to grow. These problems can be seen on ultrasound.  °Many women experience the loss of a pregnancy during their lifetime.  As many as 10-30% of pregnancies end in pregnancy loss within the first trimester (or first 12-14 weeks). ° °MANAGEMNT OF EARLY PREGNANCY LOSS: °There are 3 ways to care for an early pregnancy loss:   °(1) Surgery, (2) Medicine, (3) Waiting for you to pass the pregnancy on your own. °The decision as to how to proceed after being diagnosed with and early pregnancy loss is an individual one.  The decision can be made only after appropriate counseling.  You need to weigh the pros and cons of the 3 choices. Then you can make the choice that works for you. ° °SURGERY (D&E) °• Procedure over in 1 day °• Requires being put to sleep °• Bleeding may be light °• Possible problems during surgery, including injury to womb(uterus) °• Care provider has more control °Medicine (CYTOTEC) °• The complete procedure may take days to weeks °• No Surgery °• Bleeding may be heavy at times °• There may be drug side effects °• Patient has more control °Waiting °• You may choose to wait, in which case your own body may complete the passing of the abnormal early pregnancy on its own in about 2-4 weeks °• Your bleeding may be heavy at times °• There is a small possibility that you may need surgery if the bleeding is too much or not all of the pregnancy has passed. ° °CYTOTEC MANAGEMENT °Prostaglandins (cytotec) are the most widely used drug for this purpose. They cause the uterus to cramp and contract. You will place the medicine yourself inside your vagina in the privacy of your home.  Empting of the uterus should occur within 3 days but the process may continue for several weeks. The bleeding may seem heavy at times. ° °INSTRUCTIONS: Take all 4 tablets of cytotec (800mcg total) at one time. This will cause a lot of cramping, you may have bleeding, and pass tissue, then the cramping and bleeding should get better. If you do not pass the tissue, then you can take 4 more tablets of cytotec (800mcg total) 48 hours after your first dose.  You will come back to have your blood drawn to make sure the pregnancy hormones are dropping in 1 week. Please call us if you have any questions.  ° °POSSIBLE SIDE EFFECTS FROM CYTOTEC °• Nausea  Vomiting °• Diarrhea Fever °• Chills  Hot Flashes °Side effects  from the process of the early pregnancy loss include: °• Cramping  Bleeding °• Headaches  Dizziness °RISKS: °This is a low risk procedure. Less than 1 in 100 women has a complication. An incomplete passage of the early pregnancy may occur. Also, hemorrhage (heavy bleeding) could happen.  Rarely the pregnancy will not be passed completely. Excessively heavy bleeding may occur.  Your doctor may need to perform surgery to empty the uterus (D&E). °Afterwards: °Everybody will feel differently after the early pregnancy loss completion. You may have soreness or cramps for a day or two. You may have soreness or cramps for day or two.  You may have   light bleeding for up to 2 weeks. You may be as active as you feel like being. °If you have any of the following problems you may call [facility] at [phone] or Maternity Admissions Unit at [phone] if it is after hours. °• If you have pain that does not get better with pain medication °• Bleeding that soaks through 2 thick full-sized sanitary pads in an hour °• Cramps that last longer than 2 days °• Foul smelling discharge °• Fever above 100.4 degrees F °Even if you do not have any of these symptoms, you should have a follow-up exam to make sure you are healing  properly. Your next normal period will usually start again in 4-6 week after the loss. You can get pregnant soon after the loss, so use birth control right away. °Finally: °Make sure all your questions are answered before during and after any procedure. Follow up with medical care and family planning methods. ° °  ° ° °

## 2018-09-17 NOTE — Progress Notes (Signed)
   GYN VISIT Patient name: Theresa Rosario MRN [id number]  Date of birth: 26-Apr-1985 Chief Complaint:   Follow-up (U/S)  History of Present Illness:   Theresa Rosario is a 33 y.o. G45P1001 African American female at [redacted]w[redacted]d by LMP being seen today as a work-in for missed Ab. Had dating u/s today, CRL c/w 8wks w/o FCA. Some cramping, thought it was her fibromyalgia, no bleeding. Does desire pregnancy after this.   Patient's last menstrual period was 07/24/2018. Review of Systems:   Pertinent items are noted in HPI Denies fever/chills, dizziness, headaches, visual disturbances, fatigue, shortness of breath, chest pain, abdominal pain, vomiting, abnormal vaginal discharge/itching/odor/irritation, problems with periods, bowel movements, urination, or intercourse unless otherwise stated above.  Pertinent History Reviewed:  Reviewed past medical,surgical, social, obstetrical and family history.  Reviewed problem list, medications and allergies. Physical Assessment:   Vitals:   09/17/18 0931  BP: 124/88  Pulse: 83  There is no height or weight on file to calculate BMI.       Physical Examination:   General appearance: alert, well appearing, and in no distress  Mental status: alert, oriented to person, place, and time  Skin: warm & dry   Cardiovascular: normal heart rate noted  Respiratory: normal respiratory effort, no distress  Abdomen: soft, non-tender   Pelvic: examination not indicated  Extremities: no edema   TODAY's [name]: [name] 8 wks single IUP,no FHT,crl 16.75 mm,GS 30.9 mm,normal ovaries bilat,Kim discussed results w/pt  No results found for this or any previous visit (from the past 24 hour(s)).  Assessment & Plan:  1) Missed Ab>[redacted]w[redacted]d by LMP, today's CRL c/w [redacted]w[redacted]d w/o FCA. Discussed options of expectant management vs. cytotec vs. D&C. Discussed what to expect with miscarriage as well as warning s/s to report, reasons to seek care. Printed information given about all options. Pt  prefers cytotec. Will check CBC, BHCG today, and follow BHCGs weekly until <5.  F/U in 1 week. Blood type B+  Meds:  Meds ordered this encounter  Medications  . misoprostol (CYTOTEC) 200 MCG tablet    Sig: Place 4 tablets (800 mcg total) vaginally once for 1 dose. Repeat in 48 hours if needed    Dispense:  4 tablet    Refill:  1    Order Specific Question:   Supervising Provider    Answer:   Tania Ade H [[id number]]    Orders Placed This Encounter  Procedures  . CBC  . Beta hCG quant (ref lab)    Return in about 1 week (around 09/24/2018) for F/U, Webex.  [place], [facility] 09/17/2018 10:25 AM

## 2018-09-17 NOTE — Progress Notes (Addendum)
[name] 8 wks single IUP,no FHT,crl 16.75 mm,GS 30.9 mm,normal ovaries bilat,Kim discussed results w/pt

## 2018-09-18 ENCOUNTER — Encounter (HOSPITAL_COMMUNITY): Payer: Self-pay | Admitting: Emergency Medicine

## 2018-09-18 ENCOUNTER — Other Ambulatory Visit: Payer: Self-pay

## 2018-09-18 DIAGNOSIS — Z3A08 8 weeks gestation of pregnancy: Secondary | ICD-10-CM | POA: Insufficient documentation

## 2018-09-18 DIAGNOSIS — R5383 Other fatigue: Secondary | ICD-10-CM | POA: Diagnosis not present

## 2018-09-18 DIAGNOSIS — Z79899 Other long term (current) drug therapy: Secondary | ICD-10-CM | POA: Diagnosis not present

## 2018-09-18 DIAGNOSIS — R252 Cramp and spasm: Secondary | ICD-10-CM | POA: Insufficient documentation

## 2018-09-18 DIAGNOSIS — O039 Complete or unspecified spontaneous abortion without complication: Secondary | ICD-10-CM | POA: Diagnosis not present

## 2018-09-18 DIAGNOSIS — R42 Dizziness and giddiness: Secondary | ICD-10-CM | POA: Diagnosis not present

## 2018-09-18 DIAGNOSIS — R110 Nausea: Secondary | ICD-10-CM | POA: Insufficient documentation

## 2018-09-18 DIAGNOSIS — O9989 Other specified diseases and conditions complicating pregnancy, childbirth and the puerperium: Secondary | ICD-10-CM | POA: Diagnosis not present

## 2018-09-18 LAB — CBC
Hematocrit: 43.1 % (ref 34.0–46.6)
Hemoglobin: 13.6 g/dL (ref 11.1–15.9)
MCH: 24.8 pg — ABNORMAL LOW (ref 26.6–33.0)
MCHC: 31.6 g/dL (ref 31.5–35.7)
MCV: 79 fL (ref 79–97)
Platelets: 366 10*3/uL (ref 150–450)
RBC: 5.48 x10E6/uL — ABNORMAL HIGH (ref 3.77–5.28)
RDW: 14 % (ref 11.7–15.4)
WBC: 8.5 10*3/uL (ref 3.4–10.8)

## 2018-09-18 LAB — BETA HCG QUANT (REF LAB): hCG Quant: 7245 m[IU]/mL

## 2018-09-18 NOTE — ED Triage Notes (Signed)
Pt currently having miscarriage, followed up with OBGYN yesterday. Took medications given by them to assist with the miscarriage. Pt c/o of bleeding heavy called OBGYN and was told to come here for evaluation. States she is going through a pad about every 15 mins.

## 2018-09-19 ENCOUNTER — Emergency Department (HOSPITAL_COMMUNITY)
Admission: EM | Admit: 2018-09-19 | Discharge: 2018-09-19 | Disposition: A | Discharge status: Home / Self Care | Payer: PRIVATE HEALTH INSURANCE | Attending: Emergency Medicine | Admitting: Emergency Medicine

## 2018-09-19 DIAGNOSIS — O039 Complete or unspecified spontaneous abortion without complication: Secondary | ICD-10-CM | POA: Diagnosis not present

## 2018-09-19 LAB — ABO/RH: ABO/RH(D): B POS

## 2018-09-19 LAB — URINALYSIS, ROUTINE W REFLEX MICROSCOPIC
Bacteria, UA: NONE SEEN
Bilirubin Urine: NEGATIVE
Glucose, UA: NEGATIVE mg/dL
Ketones, ur: NEGATIVE mg/dL
Nitrite: POSITIVE — AB
Protein, ur: 100 mg/dL — AB
RBC / HPF: >50 RBC/hpf — ABNORMAL HIGH (ref 0–5)
Specific Gravity, Urine: 1.018 (ref 1.005–1.030)
pH: 7 (ref 5.0–8.0)

## 2018-09-19 LAB — HCG, QUANTITATIVE, PREGNANCY: hCG, Beta Chain, Quant, S: 4808 m[IU]/mL — ABNORMAL HIGH (ref <5)

## 2018-09-19 LAB — CBC WITH DIFFERENTIAL/PLATELET
Abs Immature Granulocytes: 0.06 10*3/uL (ref 0.00–0.07)
Basophils Absolute: 0 10*3/uL (ref 0.0–0.1)
Basophils Relative: 0 %
Eosinophils Absolute: 0.1 10*3/uL (ref 0.0–0.5)
Eosinophils Relative: 1 %
HCT: 41.1 % (ref 36.0–46.0)
Hemoglobin: 12.5 g/dL (ref 12.0–15.0)
Immature Granulocytes: 1 %
Lymphocytes Relative: 39 %
Lymphs Abs: 4.2 10*3/uL — ABNORMAL HIGH (ref 0.7–4.0)
MCH: 24.8 pg — ABNORMAL LOW (ref 26.0–34.0)
MCHC: 30.4 g/dL (ref 30.0–36.0)
MCV: 81.4 fL (ref 80.0–100.0)
Monocytes Absolute: 0.6 10*3/uL (ref 0.1–1.0)
Monocytes Relative: 6 %
Neutro Abs: 5.9 10*3/uL (ref 1.7–7.7)
Neutrophils Relative %: 53 %
Platelets: 375 10*3/uL (ref 150–400)
RBC: 5.05 MIL/uL (ref 3.87–5.11)
RDW: 14.6 % (ref 11.5–15.5)
WBC: 10.9 10*3/uL — ABNORMAL HIGH (ref 4.0–10.5)
nRBC: 0 % (ref 0.0–0.2)

## 2018-09-19 LAB — HEMOGLOBIN AND HEMATOCRIT, BLOOD
HCT: 32.6 % — ABNORMAL LOW (ref 36.0–46.0)
Hemoglobin: 9.9 g/dL — ABNORMAL LOW (ref 12.0–15.0)

## 2018-09-19 LAB — BASIC METABOLIC PANEL
Anion gap: 11 (ref 5–15)
BUN: 11 mg/dL (ref 6–20)
CO2: 26 mmol/L (ref 22–32)
Calcium: 9 mg/dL (ref 8.9–10.3)
Chloride: 101 mmol/L (ref 98–111)
Creatinine, Ser: 0.63 mg/dL (ref 0.44–1.00)
GFR calc Af Amer: >60 mL/min (ref >60)
GFR calc non Af Amer: >60 mL/min (ref >60)
Glucose, Bld: 114 mg/dL — ABNORMAL HIGH (ref 70–99)
Potassium: 3.6 mmol/L (ref 3.5–5.1)
Sodium: 138 mmol/L (ref 135–145)

## 2018-09-19 LAB — PROTIME-INR
INR: 1 (ref 0.8–1.2)
Prothrombin Time: 13 seconds (ref 11.4–15.2)

## 2018-09-19 LAB — TYPE AND SCREEN
ABO/RH(D): B POS
Antibody Screen: NEGATIVE

## 2018-09-19 MED ORDER — ACETAMINOPHEN 325 MG PO TABS
650.0000 mg | ORAL_TABLET | Freq: Once | ORAL | Status: AC
  Administered 2018-09-19: 650 mg via ORAL
  Filled 2018-09-19: qty 2

## 2018-09-19 MED ORDER — OXYTOCIN 10 UNIT/ML IJ SOLN
20.0000 [IU] | Freq: Once | INTRAMUSCULAR | Status: AC
  Administered 2018-09-19: 20 [IU]
  Filled 2018-09-19: qty 2

## 2018-09-19 MED ORDER — SODIUM CHLORIDE 0.9 % IV BOLUS
1000.0000 mL | Freq: Once | INTRAVENOUS | Status: AC
  Administered 2018-09-19: 1000 mL via INTRAVENOUS

## 2018-09-19 NOTE — Final Consult Note (Signed)
Stable for dc home. 

## 2018-09-19 NOTE — ED Provider Notes (Signed)
[facility] Provider Note   CSN: [id number] Arrival date & time: 09/18/18  2307     History   Chief Complaint Chief Complaint  Patient presents with   Miscarriage    HPI Theresa Rosario is a 33 y.o. female.     Patient here with heavy vaginal bleeding that onset tonight around 7 PM.  States she took Cytotec p.o. around 4 PM.  Missed abortion that was diagnosed by her gynecologist yesterday.  States she was about [redacted] weeks gestation.  At the follow-up appointment there was found to be no fetal cardiac activity and she elected to take Cytotec.  Weakness at 4 PM.  Prior to that she had no bleeding.  States she has had about 10-15 pads of bleeding in the past 5 hours.  Has some mild lightheadedness and nausea.  She denies any abdominal pain or cramping.  Does have some cramping across her low back.  She called her gynecologist hotline was told to come to the ED.  She does not take any blood thinners.  The history is provided by the patient.    Past Medical History:  Diagnosis Date   Blood in urine 10/18/2015   Fibromyalgia    Hx of hematuria    Hx of ovarian cyst    Kidney stone    Large breasts    Mental disorder    depression   Migraine     Patient Active Problem List   Diagnosis Date Noted   Chronic migraine 08/17/2018   Muscle spasm of both lower legs 12/02/2017   Muscle spasm of back 12/02/2017   Elevated BP without diagnosis of hypertension 12/02/2017   Irregular bleeding 12/02/2017   Weight gain 10/23/2016   Blood in urine 10/18/2015   Condyloma of female genitalia 10/21/2012   Fibromyalgia 07/13/2012   Anxiety 07/13/2012   Hx of hematuria 06/29/2012   Lumbago 11/11/2011    Past Surgical History:  Procedure Laterality Date   DENTAL SURGERY     WISDOM TOOTH EXTRACTION Bilateral      OB History    Gravida  2   Para  1   Term  1   Preterm      AB      Living  1     SAB      TAB      Ectopic     Multiple      Live Births  1            Home Medications    Prior to Admission medications   Medication Sig Start Date End Date Taking? Authorizing Provider  acetaminophen (TYLENOL) 500 MG tablet Take 1,000 mg by mouth every 8 (eight) hours as needed for mild pain.     [provider]  Butalbital-APAP-Caffeine (FIORICET) 50-300-40 MG CAPS Take 1 tablet by mouth as needed.    [provider]  cyclobenzaprine (FLEXERIL) 10 MG tablet Take 20 mg by mouth at bedtime.    [provider]  diphenhydrAMINE (BENADRYL ALLERGY) 25 mg capsule     [provider]  escitalopram (LEXAPRO) 20 MG tablet Take 1 tablet by mouth daily. 07/08/18   [provider]  etodolac (LODINE) 400 MG tablet Take 400 mg by mouth 2 (two) times daily.    [provider]  hydrochlorothiazide (MICROZIDE) 12.5 MG capsule Take 1 capsule (12.5 mg total) by mouth daily. 01/18/18   Adline PotterGriffin, Jennifer A, NP  LORazepam (ATIVAN) 0.5 MG tablet Take 0.5  mg by mouth every 8 (eight) hours as needed for anxiety.    [provider]  MELATONIN PO Take by mouth as needed.    [provider]  misoprostol (CYTOTEC) 200 MCG tablet Place 4 tablets (800 mcg total) vaginally once for 1 dose. Repeat in 48 hours if needed 09/17/18 09/17/18  Cheral MarkerBooker, Kimberly R, CNM  ondansetron (ZOFRAN ODT) 4 MG disintegrating tablet Take 1 tablet (4 mg total) by mouth every 8 (eight) hours as needed. Patient not taking: Reported on 08/27/2018 08/17/18   Levert FeinsteinYan, Yijun, MD  Prenatal Vit-Fe Fumarate-FA (PREPLUS) 27-1 MG TABS Take 1 tablet by mouth daily. 08/27/18   Adline PotterGriffin, Jennifer A, NP  promethazine (PHENERGAN) 25 MG tablet Take 1 tablet (25 mg total) by mouth every 6 (six) hours as needed for nausea or vomiting. 08/27/18   Adline PotterGriffin, Jennifer A, NP  propranolol ER (INDERAL LA) 60 MG 24 hr capsule Take 1 capsule (60 mg total) by mouth daily. 08/17/18   Levert FeinsteinYan, Yijun, MD  rizatriptan (MAXALT-MLT) 10 MG  disintegrating tablet Take 1 tablet (10 mg total) by mouth as needed. May repeat in 2 hours if needed 08/17/18   Levert FeinsteinYan, Yijun, MD  zonisamide (ZONEGRAN) 100 MG capsule Take 2 capsules (200 mg total) by mouth at bedtime. Patient not taking: Reported on 08/27/2018 08/17/18   Levert FeinsteinYan, Yijun, MD    Family History Family History  Problem Relation Age of Onset   Cancer Mother 7943       colon   Diabetes Mother    Hypertension Mother    Hypertension Father    Heart disease Maternal Grandfather    Coronary artery disease Other    Diabetes Other    Diabetes Paternal Grandmother    Hypertension Paternal Grandmother    Diabetes Maternal Grandmother     Social History Social History   Tobacco Use   Smoking status: Never Smoker   Smokeless tobacco: Never Used  Substance Use Topics   Alcohol use: Not Currently    Comment: socially   Drug use: No     Allergies   Motrin [ibuprofen]   Review of Systems Review of Systems  Constitutional: Positive for fatigue. Negative for activity change, appetite change and fever.  HENT: Negative for congestion and rhinorrhea.   Respiratory: Negative for cough, chest tightness and shortness of breath.   Cardiovascular: Negative for chest pain.  Gastrointestinal: Negative for abdominal pain, nausea and vomiting.  Genitourinary: Positive for vaginal bleeding. Negative for dysuria, frequency and hematuria.  Musculoskeletal: Positive for back pain.  Neurological: Positive for dizziness, weakness and light-headedness. Negative for headaches.   all other systems are negative except as noted in the HPI and PMH.     Physical Exam Updated Vital Signs BP (!) 145/95 (BP Location: Right Arm)    Pulse 78    Temp 98 F (36.7 C) (Oral)    Resp 16    Ht 5\' 1"  (1.549 m)    Wt 108 kg    LMP 07/24/2018    SpO2 100%    BMI 44.97 kg/m   Physical Exam Vitals signs and nursing note reviewed.  Constitutional:      General: She is not in acute distress.     Appearance: She is well-developed. She is obese.  HENT:     Head: Normocephalic and atraumatic.     Mouth/Throat:     Pharynx: No oropharyngeal exudate.  Eyes:     Conjunctiva/sclera: Conjunctivae normal.     Pupils: Pupils are equal,  round, and reactive to light.  Neck:     Musculoskeletal: Normal range of motion and neck supple.     Comments: No meningismus. Cardiovascular:     Rate and Rhythm: Normal rate and regular rhythm.     Heart sounds: Normal heart sounds. No murmur.  Pulmonary:     Effort: Pulmonary effort is normal. No respiratory distress.     Breath sounds: Normal breath sounds.  Abdominal:     Palpations: Abdomen is soft.     Tenderness: There is no abdominal tenderness. There is no guarding or rebound.  Genitourinary:    Comments: Chaperone present.  There is bright red blood with clots in the vaginal vault.  This was evacuated.  Clots and products of conception removed from cervix with forceps.  Cervix 2 cm dilated.  There is no midline or lateralizing adnexal tenderness Musculoskeletal: Normal range of motion.        General: No tenderness.  Skin:    General: Skin is warm.  Neurological:     Mental Status: She is alert and oriented to person, place, and time.     Cranial Nerves: No cranial nerve deficit.     Motor: No abnormal muscle tone.     Coordination: Coordination normal.     Comments: No ataxia on finger to nose bilaterally. No pronator drift. 5/5 strength throughout. CN 2-12 intact.Equal grip strength. Sensation intact.   Psychiatric:        Behavior: Behavior normal.      ED Treatments / Results  Labs (all labs ordered are listed, but only abnormal results are displayed) Labs Reviewed  CBC WITH DIFFERENTIAL/PLATELET - Abnormal; Notable for the following components:      Result Value   WBC 10.9 (*)    MCH 24.8 (*)    Lymphs Abs 4.2 (*)    All other components within normal limits  BASIC METABOLIC PANEL - Abnormal; Notable for the following  components:   Glucose, Bld 114 (*)    All other components within normal limits  HCG, QUANTITATIVE, PREGNANCY - Abnormal; Notable for the following components:   hCG, Beta Chain, Quant, S 4,808 (*)    All other components within normal limits  URINALYSIS, ROUTINE W REFLEX MICROSCOPIC - Abnormal; Notable for the following components:   Color, Urine AMBER (*)    APPearance CLOUDY (*)    Hgb urine dipstick LARGE (*)    Protein, ur 100 (*)    Nitrite POSITIVE (*)    Leukocytes,Ua SMALL (*)    RBC / HPF >50 (*)    All other components within normal limits  HEMOGLOBIN AND HEMATOCRIT, BLOOD - Abnormal; Notable for the following components:   Hemoglobin 9.9 (*)    HCT 32.6 (*)    All other components within normal limits  PROTIME-INR  ABO/RH  TYPE AND SCREEN    EKG    Radiology US Ob Comp Less 14 Wks  Result Date: 09/17/2018 DATING AND VIABILITY SONOGRAM Theresa Rosario is a 33 y.o. year old G2P1001 with LMP 07/24/2018 which would correlate to  [redacted]w[redacted]d weeks gestation.  She has irregular menstrual cycles.   She is here today for a confirmatory initial sonogram. GESTATION: SINGLETON   FETAL ACTIVITY:          Heart rate         No fht          The fetus is inactive. CERVIX: Appears closed ADNEXA: The ovaries are normal. GESTATIONAL AGE AND  BIOMETRICS: Gestational criteria: Estimated Date of Delivery: 04/30/19 by LMP now at [redacted]w[redacted]d Previous Scans:0 GESTATIONAL SAC           30.9 mm         8+1    weeks CROWN RUMP LENGTH           16.75 mm         8 weeks                                                                      AVERAGE EGA(BY THIS SCAN):  8 weeks  TECHNICIAN COMMENTS: [name] 8 wks single IUP,no FHT,crl 16.75 mm,GS 30.9 mm,normal ovaries bilat,Kim discussed results w/pt A copy of this report including all images has been saved and backed up to a second source for retrieval if needed. All measures and details of the anatomical scan, placentation, fluid volume and pelvic anatomy are contained  in that report. Amber Flora Lipps 09/17/2018 9:33 AM Clinical Impression and recommendations: I have reviewed the sonogram results above. NON viable pregnancy identified, based on CRL 16 cm,without identifiable Cardiac activity. Combined with the patient's current clinical course, below are my impressions and any appropriate recommendations for management based on the sonographic findings: FINDINGS: Intrauterine gestational sac:  intrauterine pregnancy.                                 Yolk sac not present      :  Visible Embryo: seen    Visible Cardiac Activity: absent Heart Rate                          Subchorionic hemorrhage:   None visualized.                              Maternal uterus/adnexae: Ovaries are  within normal limits.                                                                       Right ovary                                                                       Left ovary    Seen  Free Fluid  none    NON viable intrauterine pregnancy as above.                                      Counselled re: fetal demise. Tilda BurrowJohn V Ferguson 09/17/2018   [place] Ob Transvaginal  Result Date: 09/17/2018 DATING AND VIABILITY SONOGRAM Theresa Rosario Both is a 33 y.o. year old G2P1001 with LMP 07/24/2018 which would correlate to  4253w6d weeks gestation.  She has irregular menstrual cycles.   She is here today for a confirmatory initial sonogram. GESTATION: SINGLETON   FETAL ACTIVITY:          Heart rate         No fht          The fetus is inactive. CERVIX: Appears closed ADNEXA: The ovaries are normal. GESTATIONAL AGE AND  BIOMETRICS: Gestational criteria: Estimated Date of Delivery: 04/30/19 by LMP now at 5653w6d Previous Scans:0 GESTATIONAL SAC           30.9 mm         8+1    weeks CROWN RUMP LENGTH           16.75 mm         8 weeks                                                                      AVERAGE EGA(BY THIS SCAN):  8 weeks  TECHNICIAN  COMMENTS: US 8 wks single IUP,no FHT,crl 16.75 mm,GS 30.9 mm,normal ovaries bilat,Kim discussed results w/pt A copy of this report including all images has been saved and backed up to a second source for retrieval if needed. All measures and details of the anatomical scan, placentation, fluid volume and pelvic anatomy are contained in that report. Amber Flora LippsJ Carl 09/17/2018 9:33 AM Clinical Impression and recommendations: I have reviewed the sonogram results above. NON viable pregnancy identified, based on CRL 16 cm,without identifiable Cardiac activity. Combined with the patient's current clinical course, below are my impressions and any appropriate recommendations for management based on the sonographic findings: FINDINGS: Intrauterine gestational sac:  intrauterine pregnancy.                                 Yolk sac not present      :  Visible Embryo: seen    Visible Cardiac Activity: absent Heart Rate                          Subchorionic hemorrhage:   None visualized.                              Maternal uterus/adnexae: Ovaries are  within normal limits.  Right ovary                                                                       Left ovary    Seen                                                                       Free Fluid  none    NON viable intrauterine pregnancy as above.                                      Counselled re: fetal demise. Tilda Burrow 09/17/2018    Procedures Procedures (including critical care time)  Medications Ordered in ED Medications  sodium chloride 0.9 % bolus 1,000 mL (has no administration in time range)     Initial Impression / Assessment and Plan / ED Course  I have reviewed the triage vital signs and the nursing notes.  Pertinent labs & imaging results that were available during my care of the patient were reviewed by me and considered in my medical decision making (see chart for  details).       Patient here with vaginal bleeding after taking Cytotec for missed abortion yesterday.  Her vital signs are stable.  Labs obtained which show hemoglobin 12.5, down 1 g from 2 days ago.  Orthostatics are negative by heart rate.  Blood pressure remained stable in the 110 systolic range. Rh+. Not rhogam candidate.   Patient did have significant vaginal bleeding with clots on pelvic exam.  No fetal products seen.  She is given additional IV fluid will be monitored for stability of her blood pressure. will recheck hemoglobin.  Hemoglobin has decreased to 9.9.  Patient states she is still having some bleeding but has slowed down.  Discussed with Dr. Emelda Fear of gynecology who will come evaluate.  He agrees with starting Pitocin. Blood pressure remained stable.  He does not recommend starting blood products at this time. Dr. Emelda Fear to evaluate. Oncoming ED physician to be made aware of patient.   CRITICAL CARE Performed by: Glynn Octave Total critical care time: 35 minutes Critical care time was exclusive of separately billable procedures and treating other patients. Critical care was necessary to treat or prevent imminent or life-threatening deterioration. Critical care was time spent personally by me on the following activities: development of treatment plan with patient and/or surrogate as well as nursing, discussions with consultants, evaluation of patient's response to treatment, examination of patient, obtaining history from patient or surrogate, ordering and performing treatments and interventions, ordering and review of laboratory studies, ordering and review of radiographic studies, pulse oximetry and re-evaluation of patient's condition.   Final Clinical Impressions(s) / ED Diagnoses   Final diagnoses:  Miscarriage    ED Discharge Orders    None       Euretha Najarro, Jeannett Senior, MD 09/19/18 [phone]

## 2018-09-19 NOTE — Discharge Instructions (Signed)
Miscarriage °A miscarriage is the loss of an unborn baby (fetus) before the 20th week of pregnancy. °Follow these instructions at home: °Medicines ° °· Take over-the-counter and prescription medicines only as told by your doctor. °· If you were prescribed antibiotic medicine, take it as told by your doctor. Do not stop taking the antibiotic even if you start to feel better. °· Do not take NSAIDs unless your doctor says that this is safe for you. NSAIDs include aspirin and ibuprofen. These medicines can cause bleeding. °Activity °· Rest as directed. Ask your doctor what activities are safe for you. °· Have someone help you at home during this time. °General instructions °· Write down how many pads you use each day and how soaked they are. °· Watch the amount of tissue or clumps of blood (blood clots) that you pass from your vagina. Save any large amounts of tissue for your doctor. °· Do not use tampons, douche, or have sex until your doctor approves. °· To help you and your partner with the process of grieving, talk with your doctor or seek counseling. °· When you are ready, meet with your doctor to talk about steps you should take for your health. Also, talk with your doctor about steps to take to have a healthy pregnancy in the future. °· Keep all follow-up visits as told by your doctor. This is important. °Contact a doctor if: °· You have a fever or chills. °· You have vaginal discharge that smells bad. °· You have more bleeding. °Get help right away if: °· You have very bad cramps or pain in your back or belly. °· You pass clumps of blood that are walnut-sized or larger from your vagina. °· You pass tissue that is walnut-sized or larger from your vagina. °· You soak more than 1 regular pad in an hour. °· You get light-headed or weak. °· You faint (pass out). °· You have feelings of sadness that do not go away, or you have thoughts of hurting yourself. °Summary °· A miscarriage is the loss of an unborn baby before  the 20th week of pregnancy. °· Follow your doctor's instructions for home care. Keep all follow-up appointments. °· To help you and your partner with the process of grieving, talk with your doctor or seek counseling. °This information is not intended to replace advice given to you by your health care provider. Make sure you discuss any questions you have with your health care provider. °Document Released: 06/02/2011 Document Revised: 07/02/2018 Document Reviewed: 04/15/2016 °[employer]vier Patient Education © 2020 [employer] ° °

## 2018-09-19 NOTE — Consult Note (Addendum)
Reason for Consult:incomplete AB Referring Physician: [facility] physician  Theresa Rosario is an 33 y.o. female. She came to ED due to concern over bleeding after taking 800 mcg cytotec PV at 4 pm for miscarriage. Hgb dropped while equilibrating and dilution with IV fluids. VSS. Has passed a few tissue fragments. Not bleeding heavy at present.  Pertinent Gynecological History: Menses: flow is light Bleeding:  Contraception: none DES exposure: denies Blood transfusions: none Sexually transmitted diseases: no past history Previous GYN Procedures:   Last mammogram:  Date:  Last pap:  Date:  OB History: G2P1001noww G 2 P 1011    Menstrual History: Menarche age:  Patient's last menstrual period was 07/24/2018.    Past Medical History:  Diagnosis Date  . Blood in urine 10/18/2015  . Fibromyalgia   . Hx of hematuria   . Hx of ovarian cyst   . Kidney stone   . Large breasts   . Mental disorder    depression  . Migraine     Past Surgical History:  Procedure Laterality Date  . DENTAL SURGERY    . WISDOM TOOTH EXTRACTION Bilateral     Family History  Problem Relation Age of Onset  . Cancer Mother [id number]       colon  . Diabetes Mother   . Hypertension Mother   . Hypertension Father   . Heart disease Maternal Grandfather   . Coronary artery disease Other   . Diabetes Other   . Diabetes Paternal Grandmother   . Hypertension Paternal Grandmother   . Diabetes Maternal Grandmother     Social History:  reports that she has never smoked. She has never used smokeless tobacco. She reports previous alcohol use. She reports that she does not use drugs.  Allergies:  Allergies  Allergen Reactions  . Motrin [Ibuprofen]     Rash     Medications: I have reviewed the patient's current medications.  ROS  Blood pressure 107/77, pulse 81, temperature 98 F (36.7 C), temperature source Oral, resp. rate 17, height 5\' 1"  (1.549 m), weight 108 kg, last menstrual period  07/24/2018, SpO2 98 %. Physical Exam  Constitutional: She appears well-developed and well-nourished. No distress.  HENT:  Head: Normocephalic and atraumatic.  Eyes: Pupils are equal, round, and reactive to light.  Neck: Normal range of motion. Neck supple.  Cardiovascular: Normal rate.  Respiratory: Effort normal.  GI: Soft. There is no abdominal tenderness.  Genitourinary:    Genitourinary Comments: Os open, permits ring forceps pt able to valsalva and assist with ring forceps extraction of tissue fragments from lower uterine segment and endometrial cavity.   ebl 25 cc  Results for orders placed or performed during the hospital encounter of 09/19/18 (from the past 48 hour(s))  CBC with Differential/Platelet     Status: Abnormal   Collection Time: 09/19/18  1:20 AM  Result Value Ref Range   WBC 10.9 (H) 4.0 - 10.5 K/uL   RBC 5.05 3.87 - 5.11 MIL/uL   Hemoglobin 12.5 12.0 - 15.0 g/dL   HCT 16.141.1 09.636.0 - 04.546.0 %   MCV 81.4 80.0 - 100.0 fL   MCH 24.8 (L) 26.0 - 34.0 pg   MCHC 30.4 30.0 - 36.0 g/dL   RDW 40.914.6 81.111.5 - 91.415.5 %   Platelets 375 150 - 400 K/uL   nRBC 0.0 0.0 - 0.2 %   Neutrophils Relative % 53 %   Neutro Abs 5.9 1.7 - 7.7 K/uL   Lymphocytes Relative 39 %  Lymphs Abs 4.2 (H) 0.7 - 4.0 K/uL   Monocytes Relative 6 %   Monocytes Absolute 0.6 0.1 - 1.0 K/uL   Eosinophils Relative 1 %   Eosinophils Absolute 0.1 0.0 - 0.5 K/uL   Basophils Relative 0 %   Basophils Absolute 0.0 0.0 - 0.1 K/uL   Immature Granulocytes 1 %   Abs Immature Granulocytes 0.06 0.00 - 0.07 K/uL    Comment: Performed at [facility], [address] [place], [place] [address]  Basic metabolic panel     Status: Abnormal   Collection Time: 09/19/18  1:20 AM  Result Value Ref Range   Sodium 138 135 - 145 mmol/L   Potassium 3.6 3.5 - 5.1 mmol/L   Chloride 101 98 - 111 mmol/L   CO2 26 22 - 32 mmol/L   Glucose, Bld 114 (H) 70 - 99 mg/dL   BUN 11 6 - 20 mg/dL   Creatinine, Ser 6.040.63 0.44 - 1.00 mg/dL    Calcium 9.0 8.9 - 54.010.3 mg/dL   GFR calc non Af Amer >60 >60 mL/min   GFR calc Af Amer >60 >60 mL/min   Anion gap 11 5 - 15    Comment: Performed at [facility], [address] [place], [place] [address]  ABO/Rh     Status: None   Collection Time: 09/19/18  1:20 AM  Result Value Ref Range   ABO/RH(D)      B POS Performed at [facility], [address] [place], [place] [address]   hCG, quantitative, pregnancy     Status: Abnormal   Collection Time: 09/19/18  1:20 AM  Result Value Ref Range   hCG, Beta Chain, Quant, S 4,808 (H) <5 mIU/mL    Comment:          GEST. AGE      CONC.  (mIU/mL)   <=1 WEEK        5 - 50     2 WEEKS       50 - 500     3 WEEKS       100 - 10,000     4 WEEKS     1,000 - 30,000     5 WEEKS     3,500 - 115,000   6-8 WEEKS     12,000 - 270,000    12 WEEKS     15,000 - 220,000        FEMALE AND NON-PREGNANT FEMALE:     LESS THAN 5 mIU/mL Performed at [facility], [address] [place], [place] [address]   Protime-INR     Status: None   Collection Time: 09/19/18  1:20 AM  Result Value Ref Range   Prothrombin Time 13.0 11.4 - 15.2 seconds   INR 1.0 0.8 - 1.2    Comment: (NOTE) INR goal varies based on device and disease states. Performed at [facility], [address] [place], [place] [address]   Type and screen [facility]     Status: None   Collection Time: 09/19/18  1:20 AM  Result Value Ref Range   ABO/RH(D) B POS    Antibody Screen NEG    Sample Expiration      09/22/2018,2359 Performed at [facility], [address] [place], [place] [address]   Urinalysis, Routine w reflex microscopic     Status: Abnormal   Collection Time: 09/19/18  2:01 AM  Result Value Ref Range   Color, Urine AMBER (A) YELLOW   APPearance CLOUDY (  A) CLEAR   Specific Gravity, Urine 1.018 1.005 - 1.030   pH 7.0 5.0 - 8.0   Glucose, UA NEGATIVE NEGATIVE mg/dL   Hgb urine dipstick LARGE (A) NEGATIVE   Bilirubin Urine NEGATIVE NEGATIVE   Ketones, ur NEGATIVE  NEGATIVE mg/dL   Protein, ur 100 (A) NEGATIVE mg/dL   Nitrite POSITIVE (A) NEGATIVE   Leukocytes,Ua SMALL (A) NEGATIVE   RBC / HPF >50 (H) 0 - 5 RBC/hpf   WBC, UA 0-5 0 - 5 WBC/hpf   Bacteria, UA NONE SEEN NONE SEEN    Comment: Performed at [facility], [address], [place], [place] [address]  Hemoglobin and hematocrit, blood     Status: Abnormal   Collection Time: 09/19/18  3:48 AM  Result Value Ref Range   Hemoglobin 9.9 (L) 12.0 - 15.0 g/dL   HCT 32.6 (L) 36.0 - 46.0 %    Comment: Performed at [facility], [address] [place], [place] [address]    US Ob Comp Less 14 Wks  Result Date: 09/17/2018 DATING AND VIABILITY SONOGRAM Theresa Rosario is a 33 y.o. year old G2P1001 with LMP 07/24/2018 which would correlate to  [redacted]w[redacted]d weeks gestation.  She has irregular menstrual cycles.   She is here today for a confirmatory initial sonogram. GESTATION: SINGLETON   FETAL ACTIVITY:          Heart rate         No fht          The fetus is inactive. CERVIX: Appears closed ADNEXA: The ovaries are normal. GESTATIONAL AGE AND  BIOMETRICS: Gestational criteria: Estimated Date of Delivery: 04/30/19 by LMP now at [redacted]w[redacted]d Previous Scans:0 GESTATIONAL SAC           30.9 mm         8+1    weeks CROWN RUMP LENGTH           16.75 mm         8 weeks                                                                      AVERAGE EGA(BY THIS SCAN):  8 weeks  TECHNICIAN COMMENTS: [name] 8 wks single IUP,no FHT,crl 16.75 mm,GS 30.9 mm,normal ovaries bilat,Kim discussed results w/pt A copy of this report including all images has been saved and backed up to a second source for retrieval if needed. All measures and details of the anatomical scan, placentation, fluid volume and pelvic anatomy are contained in that report. Amber Heide Guile 09/17/2018 9:33 AM Clinical Impression and recommendations: I have reviewed the sonogram results above. NON viable pregnancy identified, based on CRL 16 cm,without identifiable Cardiac activity.  Combined with the patient's current clinical course, below are my impressions and any appropriate recommendations for management based on the sonographic findings: FINDINGS: Intrauterine gestational sac:  intrauterine pregnancy.                                 Yolk sac not present      :  Visible Embryo: seen    Visible Cardiac Activity: absent Heart Rate  Subchorionic hemorrhage:   None visualized.                              Maternal uterus/adnexae: Ovaries are  within normal limits.                                                                       Right ovary                                                                       Left ovary    Seen                                                                       Free Fluid  none    NON viable intrauterine pregnancy as above.                                      Counselled re: fetal demise. Tilda BurrowJohn V Kaemon Barnett 09/17/2018   [place] Ob Transvaginal  Result Date: 09/17/2018 DATING AND VIABILITY SONOGRAM Theresa Rosario is a 33 y.o. year old G2P1001 with LMP 07/24/2018 which would correlate to  14157w6d weeks gestation.  She has irregular menstrual cycles.   She is here today for a confirmatory initial sonogram. GESTATION: SINGLETON   FETAL ACTIVITY:          Heart rate         No fht          The fetus is inactive. CERVIX: Appears closed ADNEXA: The ovaries are normal. GESTATIONAL AGE AND  BIOMETRICS: Gestational criteria: Estimated Date of Delivery: 04/30/19 by LMP now at 51157w6d Previous Scans:0 GESTATIONAL SAC           30.9 mm         8+1    weeks CROWN RUMP LENGTH           16.75 mm         8 weeks                                                                      AVERAGE EGA(BY THIS SCAN):  8 weeks  TECHNICIAN COMMENTS: US 8 wks single IUP,no FHT,crl 16.75 mm,GS 30.9 mm,normal ovaries bilat,Kim discussed results w/pt A copy of this report including all images has been saved and backed up to a second  source for retrieval if needed. All  measures and details of the anatomical scan, placentation, fluid volume and pelvic anatomy are contained in that report. Amber Flora Lipps 09/17/2018 9:33 AM Clinical Impression and recommendations: I have reviewed the sonogram results above. NON viable pregnancy identified, based on CRL 16 cm,without identifiable Cardiac activity. Combined with the patient's current clinical course, below are my impressions and any appropriate recommendations for management based on the sonographic findings: FINDINGS: Intrauterine gestational sac:  intrauterine pregnancy.                                 Yolk sac not present      :  Visible Embryo: seen    Visible Cardiac Activity: absent Heart Rate                          Subchorionic hemorrhage:   None visualized.                              Maternal uterus/adnexae: Ovaries are  within normal limits.                                                                       Right ovary                                                                       Left ovary    Seen                                                                       Free Fluid  none    NON viable intrauterine pregnancy as above.                                      Counselled re: fetal demise. Tilda Burrow 09/17/2018    Assessment/Plan: INcomplete AB, now considered completed Rh POS  PLan: D/c home         F/u 2 wk fam tree.  Tilda Burrow 09/19/2018

## 2018-09-19 NOTE — ED Provider Notes (Signed)
Signout from Dr. Kingsley Callander.  34 year old female pregnant status post Cytotec yesterday for medical abortion here with vaginal bleeding. Physical Exam  BP 107/77   Pulse 81   Temp 98 F (36.7 C) (Oral)   Resp 17   Ht 5\' 1"  (1.549 m)   Wt 108 kg   LMP 07/24/2018   SpO2 98%   BMI 44.97 kg/m   Physical Exam  ED Course/Procedures   Clinical Course as of Sep 19 731  Sun Sep 19, 2018  0729 Dr. Glo Herring evaluated the patient and was able to help assist with a miscarriage.  He felt she was stable for discharge and has written down some recommendations.   [MB]    Clinical Course User Index [MB] Hayden Rasmussen, MD    Procedures  MDM  Dr. Caryn Bee has been consulted and is seeing the patient now.  Disposition per results of OB consult.       Hayden Rasmussen, MD 09/19/18 [phone]

## 2018-09-23 ENCOUNTER — Telehealth: Payer: Self-pay | Admitting: *Deleted

## 2018-09-23 NOTE — Telephone Encounter (Signed)
Hey Katilin.   We have you

## 2018-09-27 ENCOUNTER — Ambulatory Visit: Payer: PRIVATE HEALTH INSURANCE | Admitting: Women's Health

## 2018-09-27 ENCOUNTER — Other Ambulatory Visit: Payer: PRIVATE HEALTH INSURANCE

## 2018-10-01 ENCOUNTER — Other Ambulatory Visit: Payer: Medicaid Other

## 2018-10-01 ENCOUNTER — Other Ambulatory Visit: Payer: Self-pay

## 2018-10-01 DIAGNOSIS — O021 Missed abortion: Secondary | ICD-10-CM

## 2018-10-02 LAB — BETA HCG QUANT (REF LAB): hCG Quant: 6 m[IU]/mL

## 2018-11-24 ENCOUNTER — Telehealth: Payer: Self-pay | Admitting: *Deleted

## 2018-11-24 MED ORDER — ESCITALOPRAM OXALATE 20 MG PO TABS: 30.0000 mg | ORAL_TABLET | Freq: Every day | ORAL | 4 refills | Status: DC

## 2018-11-24 NOTE — Telephone Encounter (Signed)
I have called in new Rx lexapro 10mg  1 and 1/2 daily

## 2018-11-24 NOTE — Addendum Note (Signed)
Addended by: Marcial Pacas on: 11/24/2018 01:05 PM   Modules accepted: Orders

## 2018-11-24 NOTE — Telephone Encounter (Signed)
Email from patient:  Hello in July you told me I could increase Lexapro to 1 and 1/2 pill for stress related headaches. My Rx only covers 1 pill. Could you contact [facility] in [place] on [address] to update Rx? Thank you.

## 2018-11-24 NOTE — Telephone Encounter (Signed)
I called the patient and notified her that the new prescription has been sent to the pharmacy.

## 2018-11-29 ENCOUNTER — Ambulatory Visit (INDEPENDENT_AMBULATORY_CARE_PROVIDER_SITE_OTHER)
Admission: RE | Admit: 2018-11-29 | Discharge: 2018-11-29 | Disposition: A | Discharge status: Home / Self Care | Payer: Medicaid Other | Source: Ambulatory Visit

## 2018-11-29 DIAGNOSIS — M797 Fibromyalgia: Secondary | ICD-10-CM | POA: Diagnosis not present

## 2018-11-29 DIAGNOSIS — R202 Paresthesia of skin: Secondary | ICD-10-CM

## 2018-11-29 MED ORDER — PREDNISONE 20 MG PO TABS: 20.0000 mg | ORAL_TABLET | Freq: Two times a day (BID) | ORAL | 0 refills | Status: AC

## 2018-11-29 NOTE — ED Provider Notes (Signed)
[facility] Virtual Visit via Video Note:  Theresa Rosario  initiated request for Telemedicine visit with [facility] team. I connected with Theresa Rosario  on 11/29/2018 at 1:41 PM  for a synchronized telemedicine visit using a telephone enabled HIPPA compliant telemedicine application. I verified that I am speaking with Theresa Rosario  using two identifiers. [name], Theresa Rosario  was physically located in a [facility]gent care site and Theresa Rosario was located at a different location.   The limitations of evaluation and management by telemedicine as well as the availability of in-person appointments were discussed. Patient was informed that she  may incur a bill ( including co-pay) for this virtual visit encounter. Theresa Rosario  expressed understanding and gave verbal consent to proceed with virtual visit.   [id number] 11/29/18 Arrival Time: 1331  CC: Fibromyalgia flare  SUBJECTIVE: History from: patient. Theresa Rosario is a 33 y.o. female complains of fibromyalgia flare in head, face, hands, and back that began 1 week ago.  Denies a precipitating event or specific injury.  Pain is diffuse about the head, face, hands and back.  Describes the pain as constant and dull, and intermittently sharp in character.  Has tried OTC medications without relief.  Symptoms are made worse with working.  Reports similar symptoms in the past that resolved without intervention. Complains of numbness/tingling in hands. Denies fever, chills, nausea, vomiting, CP, SOB, weakness, changes in bowel or bladder function.    Has miscarriage in June 2020  ROS: As per HPI.  All other pertinent ROS negative.     Past Medical History:  Diagnosis Date  . Blood in urine 10/18/2015  . Fibromyalgia   . Hx of hematuria   . Hx of ovarian cyst   . Kidney stone   . Large breasts   . Mental disorder    depression  . Migraine    Past Surgical History:  Procedure  Laterality Date  . DENTAL SURGERY    . WISDOM TOOTH EXTRACTION Bilateral    Allergies  Allergen Reactions  . Motrin [Ibuprofen]     Rash    No current facility-administered medications on file prior to encounter.    Current Outpatient Medications on File Prior to Encounter  Medication Sig Dispense Refill  . acetaminophen (TYLENOL) 500 MG tablet Take 1,000 mg by mouth every 8 (eight) hours as needed for mild pain.     . Butalbital-APAP-Caffeine (FIORICET) 50-300-40 MG CAPS Take 1 tablet by mouth as needed.    . cyclobenzaprine (FLEXERIL) 10 MG tablet Take 20 mg by mouth at bedtime.    . diphenhydrAMINE (BENADRYL ALLERGY) 25 mg capsule     . escitalopram (LEXAPRO) 20 MG tablet Take 1.5 tablets (30 mg total) by mouth daily. 140 tablet 4  . etodolac (LODINE) 400 MG tablet Take 400 mg by mouth 2 (two) times daily.    . hydrochlorothiazide (MICROZIDE) 12.5 MG capsule Take 1 capsule (12.5 mg total) by mouth daily. 30 capsule 12  . LORazepam (ATIVAN) 0.5 MG tablet Take 0.5 mg by mouth every 8 (eight) hours as needed for anxiety.    Marland Kitchen MELATONIN PO Take by mouth as needed.    . Prenatal Vit-Fe Fumarate-FA (PREPLUS) 27-1 MG TABS Take 1 tablet by mouth daily. 30 tablet 12  . promethazine (PHENERGAN) 25 MG tablet Take 1 tablet (25 mg total) by mouth every 6 (six) hours as needed for nausea or vomiting. 30 tablet 1  .  rizatriptan (MAXALT-MLT) 10 MG disintegrating tablet Take 1 tablet (10 mg total) by mouth as needed. May repeat in 2 hours if needed 15 tablet 11  . [DISCONTINUED] propranolol ER (INDERAL LA) 60 MG 24 hr capsule Take 1 capsule (60 mg total) by mouth daily. 30 capsule 11  . [DISCONTINUED] zonisamide (ZONEGRAN) 100 MG capsule Take 2 capsules (200 mg total) by mouth at bedtime. (Patient not taking: Reported on 08/27/2018) 60 capsule 11    OBJECTIVE:  There were no vitals filed for this visit.  General: alert; no distress HENT: no hot potato voice Lungs: normal respiratory effort;  speaking in full sentences without difficulty Neurologic: No slurred speech Psychological: alert and cooperative; normal mood and affect  ASSESSMENT & PLAN:  1. Fibromyalgia affecting multiple sites   2. Fibromyalgia muscle pain     Meds ordered this encounter  Medications  . predniSONE (DELTASONE) 20 MG tablet    Sig: Take 1 tablet (20 mg total) by mouth 2 (two) times daily with a meal for 5 days.    Dispense:  10 tablet    Refill:  0    Order Specific Question:   Supervising Provider    Answer:   Eustace MooreELSON, YVONNE SUE [[id number]]    Continue conservative management of rest, ice, heat and gentle stretches Prednisone prescribed.  Take as directed and to completion Follow up with PCP in the next week or two for recheck and to ensure your symptoms are improving Follow up in person or go to the ER if you have any new or worsening symptoms (fever, chills, chest pain, abdominal pain, changes in bowel or bladder habits, worsening symptoms despite medication, etc...)   I discussed the assessment and treatment plan with the patient. The patient was provided an opportunity to ask questions and all were answered. The patient agreed with the plan and demonstrated an understanding of the instructions.   The patient was advised to call back or seek an in-person evaluation if the symptoms worsen or if the condition fails to improve as anticipated.  I provided 10 minutes of non-face-to-face time during this encounter.  Theresa Rosario, Theresa Rosario  11/29/2018 1:41 PM     Theresa Rosario, Theresa Rosario, Theresa Rosario 11/29/18 1342

## 2018-11-29 NOTE — Discharge Instructions (Addendum)
Continue conservative management of rest, ice, heat and gentle stretches Prednisone prescribed.  Take as directed and to completion Follow up with PCP in the next week or two for recheck and to ensure your symptoms are improving Follow up in person or go to the ER if you have any new or worsening symptoms (fever, chills, chest pain, abdominal pain, changes in bowel or bladder habits, worsening symptoms despite medication, etc...)

## 2018-12-16 DIAGNOSIS — G4733 Obstructive sleep apnea (adult) (pediatric): Secondary | ICD-10-CM | POA: Diagnosis not present

## 2018-12-23 DIAGNOSIS — G4733 Obstructive sleep apnea (adult) (pediatric): Secondary | ICD-10-CM | POA: Diagnosis not present

## 2018-12-30 ENCOUNTER — Encounter: Payer: Self-pay | Admitting: Family Medicine

## 2018-12-30 ENCOUNTER — Other Ambulatory Visit: Payer: Self-pay

## 2018-12-30 ENCOUNTER — Ambulatory Visit: Payer: Medicaid Other | Admitting: Family Medicine

## 2018-12-30 VITALS — BP 119/90 | HR 98 | Temp 98.4°F | Ht 61.0 in | Wt 262.4 lb

## 2018-12-30 DIAGNOSIS — R030 Elevated blood-pressure reading, without diagnosis of hypertension: Secondary | ICD-10-CM

## 2018-12-30 DIAGNOSIS — D649 Anemia, unspecified: Secondary | ICD-10-CM

## 2018-12-30 DIAGNOSIS — Z23 Encounter for immunization: Secondary | ICD-10-CM

## 2018-12-30 DIAGNOSIS — R635 Abnormal weight gain: Secondary | ICD-10-CM | POA: Diagnosis not present

## 2018-12-30 DIAGNOSIS — R7309 Other abnormal glucose: Secondary | ICD-10-CM

## 2018-12-30 DIAGNOSIS — D509 Iron deficiency anemia, unspecified: Secondary | ICD-10-CM | POA: Insufficient documentation

## 2018-12-30 HISTORY — DX: Anemia, unspecified: D64.9

## 2018-12-30 LAB — POCT GLYCOSYLATED HEMOGLOBIN (HGB A1C): Hemoglobin A1C: 6.3 % — AB (ref 4.0–5.6)

## 2018-12-30 NOTE — Patient Instructions (Addendum)
Take blood pressure first thing in the morning  Depression and anxiety-psy referral for evaluation-pt considering pregnancy   Fasting labwork-[facility]  OB GYN-pre-natal counseling

## 2018-12-30 NOTE — Progress Notes (Signed)
New Patient Office Visit  Subjective:  Patient ID: Theresa Rosario, female    DOB: 06-19-1985  Age: 33 y.o. MRN: [id number]  CC:  Chief Complaint  Patient presents with  . Establish Care  . Fibromyalgia  . Headache  . Recurrent Skin Infections    boils    HPI Theresa Rosario presents for elevated blood pressure Depression and anxiety-lexapro and ativan currently being used for treatment Concern for fibromyalgia-diagnosis previously primary care doctor Headaches in the past-maxalt 2019 normal +FHmother  Past Medical History:  Diagnosis Date  . Blood in urine 10/18/2015  . Fibromyalgia   . Hx of hematuria   . Hx of ovarian cyst   . Kidney stone   . Large breasts   . Mental disorder    depression  . Migraine     Past Surgical History:  Procedure Laterality Date  . DENTAL SURGERY    . WISDOM TOOTH EXTRACTION Bilateral     Family History  Problem Relation Age of Onset  . Cancer Mother 2643       colon  . Diabetes Mother   . Hypertension Mother   . Hypertension Father   . Heart disease Maternal Grandfather   . Coronary artery disease Other   . Diabetes Other   . Diabetes Paternal Grandmother   . Hypertension Paternal Grandmother   . Diabetes Maternal Grandmother     Social History  Live with 6yo daughter, boy friend. Cares for mother who lives nearby Socioeconomic History  . Marital status: Single    Spouse name: Not on file  . Number of children: 1  . Years of education: college  . Highest education level: Master's degree (e.g., MA, MS, MEng, MEd, MSW, MBA)  Occupational History  . 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 Financial resource strain: Not on file  . Food insecurity    Worry: Not on file    Inability: Not on file  . Transportation needs    Medical: Not on file    Non-medical: Not on file  Tobacco Use  . Smoking status: Never Smoker  . Smokeless tobacco: Never Used  Substance and Sexual Activity   . Alcohol use: Not Currently    Comment: socially  . Drug use: No  . Sexual activity: Yes    Birth control/protection: None  Lifestyle  . Physical activity    Days per week: Not on file    Minutes per session: Not on file  . Stress: Not on file  Relationships  . Social Musicianconnections    Talks on phone: Not on file    Gets together: Not on file    Attends religious service: Not on file    Active member of club or organization: Not on file    Attends meetings of clubs or organizations: Not on file    Relationship status: Not on file  . Intimate partner violence    Fear of current or ex partner: Not on file    Emotionally abused: Not on file    Physically abused: Not on file    Forced sexual activity: Not on file  Other Topics Concern  . Not on file  Social History Narrative   Lives at home with boyfriend and daughter.   Right-handed.   2-3 cups caffeine per day.    ROS Review of Systems  Constitutional: Positive for fatigue and unexpected weight change.  HENT: Positive for dental problem, nosebleeds and sinus pressure.  Eyes: Positive for itching.  Respiratory: Positive for apnea, choking and shortness of breath.   Gastrointestinal: Positive for nausea.  Endocrine: Positive for polydipsia.  Genitourinary: Positive for menstrual problem.  Musculoskeletal: Positive for arthralgias, back pain, myalgias and neck pain.  Allergic/Immunologic: Positive for environmental allergies.  Neurological: Positive for dizziness, light-headedness, numbness and headaches.  Psychiatric/Behavioral: Positive for agitation and decreased concentration. The patient is nervous/anxious.     Objective:   Today's Vitals: BP 119/90 (BP Location: Left Wrist, Patient Position: Sitting, Cuff Size: Large)   Pulse 98   Temp 98.4 F (36.9 C) (Oral)   Ht 5\' 1"  (1.549 m)   Wt 262 lb 6.4 oz (119 kg)   SpO2 98%   BMI 49.58 kg/m   Physical Exam Constitutional:      Appearance: She is obese.  HENT:      Head: Normocephalic and atraumatic.  Neck:     Musculoskeletal: Normal range of motion and neck supple.  Cardiovascular:     Rate and Rhythm: Normal rate and regular rhythm.  Pulmonary:     Effort: Pulmonary effort is normal.     Breath sounds: Normal breath sounds.  Abdominal:     General: Bowel sounds are normal.     Palpations: Abdomen is soft.  Musculoskeletal: Normal range of motion.  Neurological:     Mental Status: She is alert.  Psychiatric:        Mood and Affect: Mood is anxious.     Assessment & Plan:    Outpatient Encounter Medications as of 12/30/2018  Medication Sig  . acetaminophen (TYLENOL) 500 MG tablet Take 1,000 mg by mouth every 8 (eight) hours as needed for mild pain.   . Butalbital-APAP-Caffeine (FIORICET) 50-300-40 MG CAPS Take 1 tablet by mouth as needed.  . cyclobenzaprine (FLEXERIL) 10 MG tablet Take 20 mg by mouth at bedtime.  . diphenhydrAMINE (BENADRYL ALLERGY) 25 mg capsule   . escitalopram (LEXAPRO) 20 MG tablet Take 1.5 tablets (30 mg total) by mouth daily.  . hydrochlorothiazide (MICROZIDE) 12.5 MG capsule Take 1 capsule (12.5 mg total) by mouth daily.  03/01/2019 LORazepam (ATIVAN) 0.5 MG tablet Take 0.5 mg by mouth every 8 (eight) hours as needed for anxiety.  Marland Kitchen MELATONIN PO Take by mouth as needed.  . Prenatal Vit-Fe Fumarate-FA (PREPLUS) 27-1 MG TABS Take 1 tablet by mouth daily.  . promethazine (PHENERGAN) 25 MG tablet Take 1 tablet (25 mg total) by mouth every 6 (six) hours as needed for nausea or vomiting.  . [DISCONTINUED] etodolac (LODINE) 400 MG tablet Take 400 mg by mouth 2 (two) times daily.  . [DISCONTINUED] propranolol ER (INDERAL LA) 60 MG 24 hr capsule Take 1 capsule (60 mg total) by mouth daily.  . [DISCONTINUED] rizatriptan (MAXALT-MLT) 10 MG disintegrating tablet Take 1 tablet (10 mg total) by mouth as needed. May repeat in 2 hours if needed  . [DISCONTINUED] zonisamide (ZONEGRAN) 100 MG capsule Take 2 capsules (200 mg total) by  mouth at bedtime. (Patient not taking: Reported on 08/27/2018)   No facility-administered encounter medications on file as of 12/30/2018.    1. Elevated blood-pressure reading without diagnosis of hypertension - Ambulatory referral to Obstetrics / Gynecology - Ambulatory referral to Psychiatry - COMPLETE METABOLIC PANEL WITH GFR - Lipid panel - Amb Referral to Nutrition and Diabetic E  2. Elevated glucose 6.3%-diet changes, considering pregnancy - COMPLETE METABOLIC PANEL WITH GFR - POCT glycosylated hemoglobin (Hb A1C) - Lipid panel - Amb Referral to Nutrition and Diabetic  E  3. Weight gain - COMPLETE METABOLIC PANEL WITH GFR - TSH - Amb Referral to Nutrition and Diabetic E  4. Anemia, unspecified type  - CBC with Differential - COMPLETE METABOLIC PANEL WITH GFR - Lipid panel - Amb Referral to Nutrition and Diabetic E Follow-up:  1 month Gustavia Carie Hannah Beat, MD

## 2019-01-04 ENCOUNTER — Ambulatory Visit (INDEPENDENT_AMBULATORY_CARE_PROVIDER_SITE_OTHER)
Admission: RE | Admit: 2019-01-04 | Discharge: 2019-01-04 | Disposition: A | Discharge status: Home / Self Care | Payer: Medicaid Other | Source: Ambulatory Visit

## 2019-01-04 ENCOUNTER — Other Ambulatory Visit: Payer: Self-pay

## 2019-01-04 DIAGNOSIS — T7840XA Allergy, unspecified, initial encounter: Secondary | ICD-10-CM

## 2019-01-04 DIAGNOSIS — K047 Periapical abscess without sinus: Secondary | ICD-10-CM

## 2019-01-04 MED ORDER — CLINDAMYCIN HCL 300 MG PO CAPS: 300.0000 mg | ORAL_CAPSULE | Freq: Three times a day (TID) | ORAL | 0 refills | Status: AC

## 2019-01-04 NOTE — Discharge Instructions (Signed)
Stop taking the amoxicillin Take benadryl every 6 hours.  I am starting you on clindamycin for your tooth.  Follow up as needed for continued or worsening symptoms

## 2019-01-04 NOTE — ED Provider Notes (Signed)
Virtual Visit via Video Note:  Theresa Rosario  initiated request for Telemedicine visit with Theresa Rosario. I connected with Theresa Rosario  on 01/04/2019 at 8:32 AM  for a synchronized telemedicine visit using a video enabled HIPPA compliant telemedicine application. I verified that I am speaking with Theresa Rosario  using two identifiers. Theresa July, NP  was physically located in a [facility] Urgent care site and Theresa Rosario was located at a different location.   The limitations of evaluation and management by telemedicine as well as the availability of in-person appointments were discussed. Patient was informed that she  may incur a bill ( including co-pay) for this virtual visit encounter. Theresa Rosario  expressed understanding and gave verbal consent to proceed with virtual visit.     History of Present Illness:Theresa Rosario  is a 32 y.o. female presents with widespread urticaria.  Symptoms have been constant since yesterday.  She is currently on day 4 of amoxicillin.  She is having itching all over.  She is taking the amoxicillin for a dental infection.  No known history of allergic reactions in the past.   Denies any fever, joint pain. Denies any recent changes in lotions, detergents, foods or other possible irritants. No recent travel. Nobody else at home has the rash. Patient has been outside but denies any contact with plants or insects. No new foods or medications.   ROS per HPI   Past Medical History:  Diagnosis Date  . Blood in urine 10/18/2015  . Fibromyalgia   . Hx of hematuria   . Hx of ovarian cyst   . Kidney stone   . Large breasts   . Mental disorder    depression  . Migraine     Allergies  Allergen Reactions  . Motrin [Ibuprofen]     Rash         Observations/Objective:VITALS: Per patient if applicable, see vitals. GENERAL: Alert, appears well and in no acute distress. HEENT: Atraumatic, conjunctiva  clear, no obvious abnormalities on inspection of external nose and ears. NECK: Normal movements of the head and neck. CARDIOPULMONARY: No increased WOB. Speaking in clear sentences. I:E ratio WNL.  MS: Moves all visible extremities without noticeable abnormality. PSYCH: Pleasant and cooperative, well-groomed. Speech normal rate and rhythm. Affect is appropriate. Insight and judgement are appropriate. Attention is focused, linear, and appropriate.  NEURO: CN grossly intact. Oriented as arrived to appointment on time with no prompting. Moves both UE equally.  SKIN:unable to view due to blurry camera  But reports red raised welts.     Assessment and Plan: Allergic reaction-most likely to the antibiotic amoxicillin.  We will have her stop the amoxicillin and start taking clindamycin instead for the dental infection. She can take Benadryl every 6 hours She is not having any trouble swallowing or breathing or oral swelling.   Follow Up Instructions: Go to the ER for worsening symptoms    I discussed the assessment and treatment plan with the patient. The patient was provided an opportunity to ask questions and all were answered. The patient agreed with the plan and demonstrated an understanding of the instructions.   The patient was advised to call back or seek an in-person evaluation if the symptoms worsen or if the condition fails to improve as anticipated.      Theresa July, NP  01/04/2019 8:32 AM         Theresa July, NP 01/04/19  [id number]  

## 2019-01-06 ENCOUNTER — Ambulatory Visit (INDEPENDENT_AMBULATORY_CARE_PROVIDER_SITE_OTHER): Payer: Medicaid Other | Admitting: Adult Health

## 2019-01-06 ENCOUNTER — Encounter: Payer: Self-pay | Admitting: Adult Health

## 2019-01-06 ENCOUNTER — Encounter: Payer: Self-pay | Admitting: Family Medicine

## 2019-01-06 ENCOUNTER — Other Ambulatory Visit: Payer: Self-pay

## 2019-01-06 VITALS — BP 146/95 | HR 93 | Ht 61.0 in | Wt 266.5 lb

## 2019-01-06 DIAGNOSIS — N926 Irregular menstruation, unspecified: Secondary | ICD-10-CM

## 2019-01-06 DIAGNOSIS — Z3202 Encounter for pregnancy test, result negative: Secondary | ICD-10-CM | POA: Insufficient documentation

## 2019-01-06 DIAGNOSIS — I10 Essential (primary) hypertension: Secondary | ICD-10-CM

## 2019-01-06 LAB — POCT URINE PREGNANCY: Preg Test, Ur: NEGATIVE

## 2019-01-06 MED ORDER — AMLODIPINE BESYLATE 5 MG PO TABS: 5.0000 mg | ORAL_TABLET | Freq: Every day | ORAL | 3 refills | Status: DC

## 2019-01-06 NOTE — Progress Notes (Signed)
  Subjective:     Patient ID: Theresa Rosario, female   DOB: [birth year], 33 y.o.   MRN: [id number]  HPI Theresa Rosario is a 33 year old black female, single, in talk about BP meds, was told to stop by Dr Holly Bodily and BP has been elevated, and she has not had periods since July, had miscarriage in June. A1c is 6.3 and has labs to be drawn.  PCP is Dr Holly Bodily.  Review of Systems No period since July, had miscarriage in June Reviewed past medical,surgical, social and family history. Reviewed medications and allergies.     Objective:   Physical Exam BP (!) 146/95 (BP Location: Left Arm, Patient Position: Sitting, Cuff Size: Normal)   Pulse 93   Ht 5\' 1"  (1.549 m)   Wt 266 lb 8 oz (120.9 kg)   LMP 10/19/2018 (Approximate)   BMI 50.35 kg/m   UPT is negative. Skin warm and dry. Neck: mid line trachea, normal thyroid, good ROM, no lymphadenopathy noted. Lungs: clear to ausculation bilaterally. Cardiovascular: regular rate and rhythm. Fall risk is low.    Assessment:     1. Missed periods   2. Pregnancy examination or test, negative result   3. Hypertension, unspecified type       Plan:     Will stop Microzide and rx norvasc Meds ordered this encounter  Medications  . amLODipine (NORVASC) 5 MG tablet    Sig: Take 1 tablet (5 mg total) by mouth daily.    Dispense:  30 tablet    Refill:  3    Order Specific Question:   Supervising Provider    Answer:   Tania Ade H [[id number]]  Follow up in 5 weeks for pap and physical and if no period will start with provera

## 2019-01-23 DIAGNOSIS — G4733 Obstructive sleep apnea (adult) (pediatric): Secondary | ICD-10-CM | POA: Diagnosis not present

## 2019-01-30 ENCOUNTER — Ambulatory Visit (INDEPENDENT_AMBULATORY_CARE_PROVIDER_SITE_OTHER)
Admission: RE | Admit: 2019-01-30 | Discharge: 2019-01-30 | Disposition: A | Discharge status: Home / Self Care | Payer: Medicaid Other | Source: Ambulatory Visit

## 2019-01-30 DIAGNOSIS — K047 Periapical abscess without sinus: Secondary | ICD-10-CM

## 2019-01-30 DIAGNOSIS — K0889 Other specified disorders of teeth and supporting structures: Secondary | ICD-10-CM

## 2019-01-30 MED ORDER — FLUCONAZOLE 150 MG PO TABS: 150.0000 mg | ORAL_TABLET | ORAL | 0 refills | Status: DC

## 2019-01-30 MED ORDER — CLINDAMYCIN HCL 300 MG PO CAPS: 300.0000 mg | ORAL_CAPSULE | Freq: Three times a day (TID) | ORAL | 0 refills | Status: DC

## 2019-01-30 MED ORDER — LIDOCAINE VISCOUS HCL 2 % MT SOLN: 15.0000 mL | Freq: Four times a day (QID) | OROMUCOSAL | 0 refills | Status: DC | PRN

## 2019-01-30 NOTE — ED Provider Notes (Signed)
Virtual Visit via Video Note:  Theresa Rosario  initiated request for Telemedicine visit with Theresa Rosario. I connected with Theresa Rosario  on 01/30/2019 at 4:07 PM  for a synchronized telemedicine visit using a video enabled HIPPA compliant telemedicine application. I verified that I am speaking with Theresa Rosario  using two identifiers. Wallis Bamberg, PA-C  was physically located in a [facility] Urgent care site and Theresa Rosario was located at a different location.   The limitations of evaluation and management by telemedicine as well as the availability of in-person appointments were discussed. Patient was informed that she  may incur a bill ( including co-pay) for this virtual visit encounter. Theresa Rosario  expressed understanding and gave verbal consent to proceed with virtual visit.     History of Present Illness:Theresa Rosario  is a 33 y.o. female presents with recurrent dental pain.  Patient had plan for procedure but was unable to get this done.  She needs another course of antibiotics for dental infection.  ROS  No current facility-administered medications for this encounter.    Current Outpatient Medications  Medication Sig Dispense Refill  . acetaminophen (TYLENOL) 500 MG tablet Take 1,000 mg by mouth every 8 (eight) hours as needed for mild pain.     Marland Kitchen amLODipine (NORVASC) 5 MG tablet Take 1 tablet (5 mg total) by mouth daily. 30 tablet 3  . diphenhydrAMINE (BENADRYL ALLERGY) 25 mg capsule     . escitalopram (LEXAPRO) 20 MG tablet Take 1.5 tablets (30 mg total) by mouth daily. 140 tablet 4  . LORazepam (ATIVAN) 0.5 MG tablet Take 0.5 mg by mouth every 8 (eight) hours as needed for anxiety.    Marland Kitchen MELATONIN PO Take by mouth as needed.    . Prenatal Vit-Fe Fumarate-FA (PREPLUS) 27-1 MG TABS Take 1 tablet by mouth daily. 30 tablet 12     Allergies  Allergen Reactions  . Amoxicillin Hives  . Motrin [Ibuprofen]     Rash      Past Medical History:  Diagnosis Date  . Blood in urine 10/18/2015  . Fibromyalgia   . Hx of hematuria   . Hx of ovarian cyst   . Hypertension   . Kidney stone   . Large breasts   . Mental disorder    depression  . Migraine     Past Surgical History:  Procedure Laterality Date  . DENTAL SURGERY    . WISDOM TOOTH EXTRACTION Bilateral       Observations/Objective: Physical Exam Constitutional:      General: She is not in acute distress.    Appearance: Normal appearance. She is well-developed. She is not ill-appearing, toxic-appearing or diaphoretic.  HENT:     Mouth/Throat:   Eyes:     Extraocular Movements: Extraocular movements intact.  Pulmonary:     Effort: Pulmonary effort is normal.  Neurological:     General: No focal deficit present.     Mental Status: She is alert and oriented to person, place, and time.  Psychiatric:        Mood and Affect: Mood normal.        Behavior: Behavior normal.        Thought Content: Thought content normal.        Judgment: Judgment normal.      Assessment and Plan:  1. Dental infection   2. Pain, dental    Patient has allergies to amoxicillin, will use clindamycin.  Emphasized need to continue efforts at scheduling her procedure with her dentist. Counseled patient on potential for adverse effects with medications prescribed/recommended today, ER and return-to-clinic precautions discussed, patient verbalized understanding.    Follow Up Instructions:    I discussed the assessment and treatment plan with the patient. The patient was provided an opportunity to ask questions and all were answered. The patient agreed with the plan and demonstrated an understanding of the instructions.   The patient was advised to call back or seek an in-person evaluation if the symptoms worsen or if the condition fails to improve as anticipated.  I provided 10 minutes of non-face-to-face time during this encounter.    Jaynee Eagles, PA-C   01/30/2019 4:07 PM         Jaynee Eagles, PA-C 02/02/19 [phone]

## 2019-01-31 DIAGNOSIS — R030 Elevated blood-pressure reading, without diagnosis of hypertension: Secondary | ICD-10-CM | POA: Diagnosis not present

## 2019-01-31 DIAGNOSIS — R635 Abnormal weight gain: Secondary | ICD-10-CM | POA: Diagnosis not present

## 2019-01-31 DIAGNOSIS — D649 Anemia, unspecified: Secondary | ICD-10-CM | POA: Diagnosis not present

## 2019-01-31 DIAGNOSIS — R7309 Other abnormal glucose: Secondary | ICD-10-CM | POA: Diagnosis not present

## 2019-02-01 LAB — COMPLETE METABOLIC PANEL WITH GFR
AG Ratio: 1.4 (calc) (ref 1.0–2.5)
ALT: 18 U/L (ref 6–29)
AST: 16 U/L (ref 10–30)
Albumin: 4 g/dL (ref 3.6–5.1)
Alkaline phosphatase (APISO): 77 U/L (ref 31–125)
BUN: 11 mg/dL (ref 7–25)
CO2: 27 mmol/L (ref 20–32)
Calcium: 9.4 mg/dL (ref 8.6–10.2)
Chloride: 102 mmol/L (ref 98–110)
Creat: 0.55 mg/dL (ref 0.50–1.10)
GFR, Est African American: 143 mL/min/{1.73_m2} (ref >=60)
GFR, Est Non African American: 123 mL/min/{1.73_m2} (ref >=60)
Globulin: 2.9 g/dL (calc) (ref 1.9–3.7)
Glucose, Bld: 104 mg/dL — ABNORMAL HIGH (ref 65–99)
Potassium: 5 mmol/L (ref 3.5–5.3)
Sodium: 137 mmol/L (ref 135–146)
Total Bilirubin: 0.3 mg/dL (ref 0.2–1.2)
Total Protein: 6.9 g/dL (ref 6.1–8.1)

## 2019-02-01 LAB — CBC WITH DIFFERENTIAL/PLATELET
Absolute Monocytes: 585 cells/uL (ref 200–950)
Basophils Absolute: 30 cells/uL (ref 0–200)
Basophils Relative: 0.4 %
Eosinophils Absolute: 173 cells/uL (ref 15–500)
Eosinophils Relative: 2.3 %
HCT: 38.2 % (ref 35.0–45.0)
Hemoglobin: 11.6 g/dL — ABNORMAL LOW (ref 11.7–15.5)
Lymphs Abs: 2873 cells/uL (ref 850–3900)
MCH: 20.5 pg — ABNORMAL LOW (ref 27.0–33.0)
MCHC: 30.4 g/dL — ABNORMAL LOW (ref 32.0–36.0)
MCV: 67.6 fL — ABNORMAL LOW (ref 80.0–100.0)
MPV: 9.7 fL (ref 7.5–12.5)
Monocytes Relative: 7.8 %
Neutro Abs: 3840 cells/uL (ref 1500–7800)
Neutrophils Relative %: 51.2 %
Platelets: 441 10*3/uL — ABNORMAL HIGH (ref 140–400)
RBC: 5.65 10*6/uL — ABNORMAL HIGH (ref 3.80–5.10)
RDW: 19.6 % — ABNORMAL HIGH (ref 11.0–15.0)
Total Lymphocyte: 38.3 %
WBC: 7.5 10*3/uL (ref 3.8–10.8)

## 2019-02-01 LAB — TSH: TSH: 2.55 mIU/L

## 2019-02-03 ENCOUNTER — Other Ambulatory Visit: Payer: Self-pay

## 2019-02-03 ENCOUNTER — Ambulatory Visit: Payer: Medicaid Other | Admitting: Family Medicine

## 2019-02-03 VITALS — BP 143/92 | HR 94 | Temp 98.3°F | Ht 61.0 in | Wt 264.0 lb

## 2019-02-03 DIAGNOSIS — I10 Essential (primary) hypertension: Secondary | ICD-10-CM | POA: Diagnosis not present

## 2019-02-03 DIAGNOSIS — D649 Anemia, unspecified: Secondary | ICD-10-CM

## 2019-02-03 MED ORDER — AMLODIPINE BESYLATE 10 MG PO TABS: 10.0000 mg | ORAL_TABLET | Freq: Every day | ORAL | 1 refills | Status: DC

## 2019-02-03 NOTE — Progress Notes (Signed)
Established Patient Office Visit  Subjective:  Patient ID: Theresa Rosario, female    DOB: 11-Oct-1985  Age: 33 y.o. MRN: [id number]  CC:  Chief Complaint  Patient presents with  . Hypertension    follow up    HPI Theresa Rosario presents for HTN followup-doing well on amlodipine-taking daily  Past Medical History:  Diagnosis Date  . Blood in urine 10/18/2015  . Fibromyalgia   . Hx of hematuria   . Hx of ovarian cyst   . Hypertension   . Kidney stone   . Large breasts   . Mental disorder    depression  . Migraine     Past Surgical History:  Procedure Laterality Date  . DENTAL SURGERY    . WISDOM TOOTH EXTRACTION Bilateral     Family History  Problem Relation Age of Onset  . Cancer Mother 45       colon  . Diabetes Mother   . Hypertension Mother   . Hypertension Father   . Heart disease Maternal Grandfather   . Coronary artery disease Other   . Diabetes Other   . Diabetes Paternal Grandmother   . Hypertension Paternal Grandmother   . Diabetes Maternal Grandmother     Social History   Socioeconomic History  . Marital status: Significant Other    Spouse name: Not on file  . Number of children: 1  . Years of education: college  . Highest education level: Master's degree (e.g., MA, MS, MEng, MEd, MSW, MBA)  Occupational History  . Occupation: part-time dietary aid in [employer]  . Financial resource strain: Not on file  . Food insecurity    Worry: Not on file    Inability: Not on file  . Transportation needs    Medical: Not on file    Non-medical: Not on file  Tobacco Use  . Smoking status: Never Smoker  . Smokeless tobacco: Never Used  Substance and Sexual Activity  . Alcohol use: Not Currently    Comment: socially  . Drug use: No  . Sexual activity: Yes    Birth control/protection: None  Lifestyle  . Physical activity    Days per week: Not on file    Minutes per session: Not on file  . Stress: Not on file   Relationships  . Social Herbalist on phone: Not on file    Gets together: Not on file    Attends religious service: Not on file    Active member of club or organization: Not on file    Attends meetings of clubs or organizations: Not on file    Relationship status: Not on file  . Intimate partner violence    Fear of current or ex partner: Not on file    Emotionally abused: Not on file    Physically abused: Not on file    Forced sexual activity: Not on file  Other Topics Concern  . Not on file  Social History Narrative   Lives at home with boyfriend and daughter.   Right-handed.   2-3 cups caffeine per day.    Outpatient Medications Prior to Visit  Medication Sig Dispense Refill  . acetaminophen (TYLENOL) 500 MG tablet Take 1,000 mg by mouth every 8 (eight) hours as needed for mild pain.     Marland Kitchen amLODipine (NORVASC) 5 MG tablet Take 1 tablet (5 mg total) by mouth daily. 30 tablet 3  . clindamycin (CLEOCIN) 300 MG capsule Take 1  capsule (300 mg total) by mouth 3 (three) times daily. 21 capsule 0  . diphenhydrAMINE (BENADRYL ALLERGY) 25 mg capsule     . escitalopram (LEXAPRO) 20 MG tablet Take 1.5 tablets (30 mg total) by mouth daily. 140 tablet 4  . fluconazole (DIFLUCAN) 150 MG tablet Take 1 tablet (150 mg total) by mouth once a week. 2 tablet 0  . lidocaine (XYLOCAINE) 2 % solution Use as directed 15 mLs in the mouth or throat every 6 (six) hours as needed for mouth pain. 200 mL 0  . LORazepam (ATIVAN) 0.5 MG tablet Take 0.5 mg by mouth every 8 (eight) hours as needed for anxiety.    Marland Kitchen MELATONIN PO Take by mouth as needed.    . Prenatal Vit-Fe Fumarate-FA (PREPLUS) 27-1 MG TABS Take 1 tablet by mouth daily. 30 tablet 12   No facility-administered medications prior to visit.     Allergies  Allergen Reactions  . Amoxicillin Hives  . Motrin [Ibuprofen]     Rash     ROS Review of Systems  Eyes: Negative for visual disturbance.  Respiratory: Negative for chest  tightness.   Cardiovascular: Negative for chest pain and leg swelling.  Neurological: Negative for dizziness and headaches.      Objective:    Physical Exam  Constitutional: She is oriented to person, place, and time. She appears well-developed and well-nourished. No distress.  HENT:  Head: Normocephalic and atraumatic.  Eyes: Conjunctivae are normal.  Neck: Normal range of motion. Neck supple.  Cardiovascular: Normal rate, regular rhythm and normal heart sounds.  Pulmonary/Chest: Effort normal and breath sounds normal.  Neurological: She is oriented to person, place, and time.    BP (!) 143/92 (BP Location: Left Arm, Patient Position: Sitting, Cuff Size: Normal)   Pulse 94   Temp 98.3 F (36.8 C) (Oral)   Ht 5\' 1"  (1.549 m)   Wt 264 lb (119.7 kg)   SpO2 97%   BMI 49.88 kg/m  Wt Readings from Last 3 Encounters:  02/03/19 264 lb (119.7 kg)  01/06/19 266 lb 8 oz (120.9 kg)  12/30/18 262 lb 6.4 oz (119 kg)     Health Maintenance Due  Topic Date Due  . PAP SMEAR-Modifier  10/18/2018    Lab Results  Component Value Date   TSH 2.55 01/31/2019   Lab Results  Component Value Date   WBC 7.5 01/31/2019   HGB 11.6 (L) 01/31/2019   HCT 38.2 01/31/2019   MCV 67.6 (L) 01/31/2019   PLT 441 (H) 01/31/2019   Lab Results  Component Value Date   NA 137 01/31/2019   K 5.0 01/31/2019   CO2 27 01/31/2019   GLUCOSE 104 (H) 01/31/2019   BUN 11 01/31/2019   CREATININE 0.55 01/31/2019   BILITOT 0.3 01/31/2019   ALKPHOS 96 10/23/2016   AST 16 01/31/2019   ALT 18 01/31/2019   PROT 6.9 01/31/2019   ALBUMIN 4.1 10/23/2016   CALCIUM 9.4 01/31/2019   ANIONGAP 11 09/19/2018   Lab Results  Component Value Date   HGBA1C 6.3 (A) 12/30/2018      Assessment & Plan:  Follow-up: No follow-ups on file.  1. Hypertension, unspecified type Increase amlodipine to 10mg -rx-risk/benefit/side effects discussed Watch salt and caffeine 2. Elevated glucose A1c 6.3%-watch  carbohydrates-nutritional appt next week to discuss changes  3. Anemia, unspecified type Watch cbc -recheck in 1 month LISA 03/01/2019, MD

## 2019-02-03 NOTE — Patient Instructions (Addendum)
Keep follow ups appointments Check blood pressure goal 130/80 Hypertension, Adult Hypertension is another name for high blood pressure. High blood pressure forces your heart to work harder to pump blood. This can cause problems over time. There are two numbers in a blood pressure reading. There is a top number (systolic) over a bottom number (diastolic). It is best to have a blood pressure that is below 120/80. Healthy choices can help lower your blood pressure, or you may need medicine to help lower it. What are the causes? The cause of this condition is not known. Some conditions may be related to high blood pressure. What increases the risk?  Smoking.  Having type 2 diabetes mellitus, high cholesterol, or both.  Not getting enough exercise or physical activity.  Being overweight.  Having too much fat, sugar, calories, or salt (sodium) in your diet.  Drinking too much alcohol.  Having long-term (chronic) kidney disease.  Having a family history of high blood pressure.  Age. Risk increases with age.  Race. You may be at higher risk if you are African American.  Gender. Men are at higher risk than women before age 27. After age 30, women are at higher risk than men.  Having obstructive sleep apnea.  Stress. What are the signs or symptoms?  High blood pressure may not cause symptoms. Very high blood pressure (hypertensive crisis) may cause: ? Headache. ? Feelings of worry or nervousness (anxiety). ? Shortness of breath. ? Nosebleed. ? A feeling of being sick to your stomach (nausea). ? Throwing up (vomiting). ? Changes in how you see. ? Very bad chest pain. ? Seizures. How is this treated?  This condition is treated by making healthy lifestyle changes, such as: ? Eating healthy foods. ? Exercising more. ? Drinking less alcohol.  Your health care provider may prescribe medicine if lifestyle changes are not enough to get your blood pressure under control, and  if: ? Your top number is above 130. ? Your bottom number is above 80.  Your personal target blood pressure may vary. Follow these instructions at home: Eating and drinking   If told, follow the DASH eating plan. To follow this plan: ? Fill one half of your plate at each meal with fruits and vegetables. ? Fill one fourth of your plate at each meal with whole grains. Whole grains include whole-wheat pasta, brown rice, and whole-grain bread. ? Eat or drink low-fat dairy products, such as skim milk or low-fat yogurt. ? Fill one fourth of your plate at each meal with low-fat (lean) proteins. Low-fat proteins include fish, chicken without skin, eggs, beans, and tofu. ? Avoid fatty meat, cured and processed meat, or chicken with skin. ? Avoid pre-made or processed food.  Eat less than 1,500 mg of salt each day.  Do not drink alcohol if: ? Your doctor tells you not to drink. ? You are pregnant, may be pregnant, or are planning to become pregnant.  If you drink alcohol: ? Limit how much you use to:  0-1 drink a day for women.  0-2 drinks a day for men. ? Be aware of how much alcohol is in your drink. In the [place] one drink equals one 12 oz bottle of beer (355 mL), one 5 oz glass of wine (148 mL), or one 1 oz glass of hard liquor (44 mL). Lifestyle   Work with your doctor to stay at a healthy weight or to lose weight. Ask your doctor what the best weight is for you.  Get at least 30 minutes of exercise most days of the week. This may include walking, swimming, or biking.  Get at least 30 minutes of exercise that strengthens your muscles (resistance exercise) at least 3 days a week. This may include lifting weights or doing Pilates.  Do not use any products that contain nicotine or tobacco, such as cigarettes, e-cigarettes, and chewing tobacco. If you need help quitting, ask your doctor.  Check your blood pressure at home as told by your doctor.  Keep all follow-up visits as told by  your doctor. This is important. Medicines  Take over-the-counter and prescription medicines only as told by your doctor. Follow directions carefully.  Do not skip doses of blood pressure medicine. The medicine does not work as well if you skip doses. Skipping doses also puts you at risk for problems.  Ask your doctor about side effects or reactions to medicines that you should watch for. Contact a doctor if you:  Think you are having a reaction to the medicine you are taking.  Have headaches that keep coming back (recurring).  Feel dizzy.  Have swelling in your ankles.  Have trouble with your vision. Get help right away if you:  Get a very bad headache.  Start to feel mixed up (confused).  Feel weak or numb.  Feel faint.  Have very bad pain in your: ? Chest. ? Belly (abdomen).  Throw up more than once.  Have trouble breathing. Summary  Hypertension is another name for high blood pressure.  High blood pressure forces your heart to work harder to pump blood.  For most people, a normal blood pressure is less than 120/80.  Making healthy choices can help lower blood pressure. If your blood pressure does not get lower with healthy choices, you may need to take medicine. This information is not intended to replace advice given to you by your health care provider. Make sure you discuss any questions you have with your health care provider. Document Released: 08/27/2007 Document Revised: 11/18/2017 Document Reviewed: 11/18/2017 Elsevier Patient Education  2020 [employer].

## 2019-02-07 ENCOUNTER — Encounter: Payer: Self-pay | Admitting: Nutrition

## 2019-02-07 ENCOUNTER — Other Ambulatory Visit: Payer: Self-pay

## 2019-02-07 ENCOUNTER — Encounter: Payer: Medicaid Other | Attending: Family Medicine | Admitting: Nutrition

## 2019-02-07 VITALS — Ht 61.0 in | Wt 267.0 lb

## 2019-02-07 DIAGNOSIS — R030 Elevated blood-pressure reading, without diagnosis of hypertension: Secondary | ICD-10-CM

## 2019-02-07 DIAGNOSIS — Z6841 Body Mass Index (BMI) 40.0 and over, adult: Secondary | ICD-10-CM | POA: Diagnosis not present

## 2019-02-07 DIAGNOSIS — Z713 Dietary counseling and surveillance: Secondary | ICD-10-CM | POA: Insufficient documentation

## 2019-02-07 DIAGNOSIS — E669 Obesity, unspecified: Secondary | ICD-10-CM | POA: Insufficient documentation

## 2019-02-07 DIAGNOSIS — R739 Hyperglycemia, unspecified: Secondary | ICD-10-CM

## 2019-02-07 DIAGNOSIS — I10 Essential (primary) hypertension: Secondary | ICD-10-CM

## 2019-02-07 NOTE — Progress Notes (Signed)
  Medical Nutrition Therapy:  Appt start time: 7001 end time:  1630.   Assessment:  Primary concerns today: Obesity. PreDM. LIves with boyfriend. And daughter. Cooks . Most foods are fried, and baked.  Feels like she has gained about 20-40 lbs.  Just lost a baby in June 2020 at 2 months gestation. Has her masters in pschology.  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 Has fibromyalgia . Wants to lose 40 lbs in the next 6 months. She tends to eat when she is in pain.  She notes she eats in binges and eats when she can't sleep. Willing to work on making better food choices and changing her lifestyle for needed weight loss and prevention of DM Type 2. Diet is excessive in calories, fat and sodium and inadequate in fresh fruits and vegetables.    Lab Results  Component Value Date   HGBA1C 6.3 (A) 12/30/2018   CMP Latest Ref Rng & Units 02/26/2019 01/31/2019 09/19/2018  Glucose 70 - 99 mg/dL 86 104(H) 114(H)  BUN 6 - 20 mg/dL 9 11 11   Creatinine 0.44 - 1.00 mg/dL 0.68 0.55 0.63  Sodium 135 - 145 mmol/L 137 137 138  Potassium 3.5 - 5.1 mmol/L 3.5 5.0 3.6  Chloride 98 - 111 mmol/L 101 102 101  CO2 22 - 32 mmol/L 26 27 26   Calcium 8.9 - 10.3 mg/dL 9.2 9.4 9.0  Total Protein 6.1 - 8.1 g/dL - 6.9 -  Total Bilirubin 0.2 - 1.2 mg/dL - 0.3 -  Alkaline Phos 39 - 117 IU/L - - -  AST 10 - 30 U/L - 16 -  ALT 6 - 29 U/L - 18 -    Preferred Learning Style:   No preference indicated   Learning Readiness:   Ready  Change in progress   MEDICATIONS:   DIETARY INTAKE:  24-hr recall:  B ( AM): Coffee, 1/2 sausage biscuits Snk ( AM):   L ( PM):  [place] , dressing, gravy, Coffee, water, Diet sodas Snk ( PM): peppermints. D ( PM): Cube steak, mashed potatoes, pinto beans and green beans, DIet soda Snk ( PM): fruit cup Beverages: diet sodas, water,   Usual physical activity: ADL  Estimated energy needs: 1200 calories 135 g carbohydrates 90 g protein 33 g fat  Progress Towards  Goal(s):  In progress.   Nutritional Diagnosis:  NI-1.5 Excessive energy intake As related to High Calorie HIgh fat diet.  As evidenced by BMI > 30 and 20-30 lb weight gain.    Intervention:  Nutrition and  Pre-Diabetes education provided on My Plate, CHO counting, meal planning, portion sizes, timing of meals, avoiding snacks between meals,  target ranges for A1C and blood sugars, signs/symptoms and treatment of hyper/hypoglycemia, monitoring blood sugars, taking medications as prescribed, benefits of exercising 60 minutes per day and prevention of  DM. Marland Kitchen Goals Follow MY Plate Eat 3 meals a day at times discussed Cut out snacks, processed foods and fried foods. Increase fresh fruits and vegetables. Drink only water and cut out sodas, juice and sugar beverages. Lose 1-2 lbs per week   Teaching Method Utilized:  Visual Auditory Hands on  Handouts given during visit include:  The Plate Method   Meal Plan Card  Pre Dm instructions   Barriers to learning/adherence to lifestyle change: none  Demonstrated degree of understanding via:  Teach Back   Monitoring/Evaluation:  Dietary intake, exercise, and body weight in 1 month(s).

## 2019-02-09 ENCOUNTER — Encounter: Payer: Self-pay | Admitting: Adult Health

## 2019-02-09 ENCOUNTER — Ambulatory Visit (INDEPENDENT_AMBULATORY_CARE_PROVIDER_SITE_OTHER): Payer: Medicaid Other | Admitting: Adult Health

## 2019-02-09 ENCOUNTER — Other Ambulatory Visit (HOSPITAL_COMMUNITY)
Admission: RE | Admit: 2019-02-09 | Discharge: 2019-02-09 | Disposition: A | Discharge status: Home / Self Care | Payer: Medicaid Other | Source: Ambulatory Visit | Attending: Adult Health | Admitting: Adult Health

## 2019-02-09 ENCOUNTER — Other Ambulatory Visit: Payer: Self-pay

## 2019-02-09 VITALS — BP 138/88 | HR 85 | Ht 62.0 in | Wt 263.0 lb

## 2019-02-09 DIAGNOSIS — Z0000 Encounter for general adult medical examination without abnormal findings: Secondary | ICD-10-CM

## 2019-02-09 DIAGNOSIS — Z01419 Encounter for gynecological examination (general) (routine) without abnormal findings: Secondary | ICD-10-CM

## 2019-02-09 DIAGNOSIS — Z800 Family history of malignant neoplasm of digestive organs: Secondary | ICD-10-CM | POA: Insufficient documentation

## 2019-02-09 NOTE — Progress Notes (Signed)
Patient ID: TOCARRA GASSEN, female   DOB: Apr 24, 1985, 33 y.o.   MRN: [id number] History of Present Illness: Tymia is a 33 year old year old black female,single, G2P1011, in for a Well woman gyn exam and pap PCP is Dr Holly Bodily.  Current Medications, Allergies, Past Medical History, Past Surgical History, Family History and Social History were reviewed in [employer] record.     Review of Systems:  Patient denies any headaches, hearing loss, fatigue, blurred vision, shortness of breath, chest pain, abdominal pain, problems with bowel movements, urination, or intercourse. No joint pain or mood swings. +swelling in feet, PCP is aware    Physical Exam:BP 138/88 (BP Location: Left Arm, Patient Position: Sitting, Cuff Size: Normal)   Pulse 85   Ht 5\' 2"  (1.575 m)   Wt 263 lb (119.3 kg)   LMP 01/22/2019   BMI 48.10 kg/m  General:  Well developed, well nourished, no acute distress Skin:  Warm and dry Neck:  Midline trachea, normal thyroid, good ROM, no lymphadenopathy Lungs; Clear to auscultation bilaterally Breast:  No dominant palpable mass, retraction, or nipple discharge,and large Cardiovascular: Regular rate and rhythm Abdomen:  Soft, non tender, no hepatosplenomegaly Pelvic:  External genitalia is normal in appearance, no lesions.  The vagina is normal in appearance. Urethra has no lesions or masses. The cervix is bulbous.Pap with GC/CHL and high risk HPV 16/18 genotyping performed  Uterus is felt to be normal size, shape, and contour.  No adnexal masses or tenderness noted.Bladder is non tender, no masses felt. Extremities/musculoskeletal:  No swelling or varicosities noted, no clubbing or cyanosis Psych:  No mood changes, alert and cooperative,seems happy Fall risk is low PHQ 2 score is 0. Co exam with Weyman Croon FNP student. She declines birth control and is OK if get pregnant, so take OTC PNV   Impression and Plan: 1. Routine medical exam  2.  Encounter for gynecological examination with Papanicolaou smear of cervix Pap sent Physical in 1 year Pap in 3 if normal Labs with PCP Mammogram at 40  3. Family history of colon cancer in mother Referred to Dr Oneida Alar for colonoscopy

## 2019-02-11 LAB — CYTOLOGY - PAP
Chlamydia: NEGATIVE
Comment: NEGATIVE
Comment: NEGATIVE
Comment: NORMAL
Diagnosis: NEGATIVE
High risk HPV: NEGATIVE
Neisseria Gonorrhea: NEGATIVE

## 2019-02-14 ENCOUNTER — Encounter: Payer: Self-pay | Admitting: Internal Medicine

## 2019-02-22 DIAGNOSIS — G4733 Obstructive sleep apnea (adult) (pediatric): Secondary | ICD-10-CM | POA: Diagnosis not present

## 2019-02-25 ENCOUNTER — Encounter (HOSPITAL_COMMUNITY): Payer: Self-pay

## 2019-02-25 ENCOUNTER — Other Ambulatory Visit: Payer: Self-pay

## 2019-02-25 DIAGNOSIS — R079 Chest pain, unspecified: Secondary | ICD-10-CM | POA: Insufficient documentation

## 2019-02-25 DIAGNOSIS — Z5321 Procedure and treatment not carried out due to patient leaving prior to being seen by health care provider: Secondary | ICD-10-CM | POA: Insufficient documentation

## 2019-02-25 DIAGNOSIS — R072 Precordial pain: Secondary | ICD-10-CM | POA: Diagnosis not present

## 2019-02-25 DIAGNOSIS — R0781 Pleurodynia: Secondary | ICD-10-CM | POA: Diagnosis not present

## 2019-02-25 DIAGNOSIS — R0602 Shortness of breath: Secondary | ICD-10-CM | POA: Diagnosis not present

## 2019-02-25 NOTE — ED Triage Notes (Signed)
Pt reports right sided chest pain that started Wed night, the pain has not gotten worse, but has not went away per pt report. Pt denies any other symptoms at this time. Pain is reproducible with palpation. EKG performed and given to Dr Roxanne Mins.

## 2019-02-26 ENCOUNTER — Emergency Department (HOSPITAL_COMMUNITY): Payer: Medicaid Other

## 2019-02-26 ENCOUNTER — Emergency Department (HOSPITAL_COMMUNITY)
Admission: EM | Admit: 2019-02-26 | Discharge: 2019-02-26 | Disposition: A | Discharge status: Home / Self Care | Payer: Medicaid Other | Source: Home / Self Care | Attending: Emergency Medicine | Admitting: Emergency Medicine

## 2019-02-26 ENCOUNTER — Emergency Department (HOSPITAL_COMMUNITY)
Admission: EM | Admit: 2019-02-26 | Discharge: 2019-02-26 | Disposition: A | Discharge status: Home / Self Care | Payer: Medicaid Other | Attending: Emergency Medicine | Admitting: Emergency Medicine

## 2019-02-26 ENCOUNTER — Encounter (HOSPITAL_COMMUNITY): Payer: Self-pay | Admitting: Emergency Medicine

## 2019-02-26 DIAGNOSIS — R079 Chest pain, unspecified: Secondary | ICD-10-CM | POA: Diagnosis not present

## 2019-02-26 DIAGNOSIS — R0602 Shortness of breath: Secondary | ICD-10-CM | POA: Diagnosis not present

## 2019-02-26 DIAGNOSIS — Z79899 Other long term (current) drug therapy: Secondary | ICD-10-CM | POA: Insufficient documentation

## 2019-02-26 DIAGNOSIS — R072 Precordial pain: Secondary | ICD-10-CM

## 2019-02-26 DIAGNOSIS — R0781 Pleurodynia: Secondary | ICD-10-CM | POA: Diagnosis not present

## 2019-02-26 DIAGNOSIS — I10 Essential (primary) hypertension: Secondary | ICD-10-CM | POA: Insufficient documentation

## 2019-02-26 LAB — CBC
HCT: 38.1 % (ref 36.0–46.0)
Hemoglobin: 11.1 g/dL — ABNORMAL LOW (ref 12.0–15.0)
MCH: 20.8 pg — ABNORMAL LOW (ref 26.0–34.0)
MCHC: 29.1 g/dL — ABNORMAL LOW (ref 30.0–36.0)
MCV: 71.5 fL — ABNORMAL LOW (ref 80.0–100.0)
Platelets: 378 10*3/uL (ref 150–400)
RBC: 5.33 MIL/uL — ABNORMAL HIGH (ref 3.87–5.11)
RDW: 22.2 % — ABNORMAL HIGH (ref 11.5–15.5)
WBC: 11.7 10*3/uL — ABNORMAL HIGH (ref 4.0–10.5)
nRBC: 0 % (ref 0.0–0.2)

## 2019-02-26 LAB — BASIC METABOLIC PANEL
Anion gap: 10 (ref 5–15)
BUN: 9 mg/dL (ref 6–20)
CO2: 26 mmol/L (ref 22–32)
Calcium: 9.2 mg/dL (ref 8.9–10.3)
Chloride: 101 mmol/L (ref 98–111)
Creatinine, Ser: 0.68 mg/dL (ref 0.44–1.00)
GFR calc Af Amer: >60 mL/min (ref >60)
GFR calc non Af Amer: >60 mL/min (ref >60)
Glucose, Bld: 86 mg/dL (ref 70–99)
Potassium: 3.5 mmol/L (ref 3.5–5.1)
Sodium: 137 mmol/L (ref 135–145)

## 2019-02-26 LAB — TROPONIN I (HIGH SENSITIVITY)
Troponin I (High Sensitivity): 2 ng/L (ref <18)
Troponin I (High Sensitivity): 2 ng/L (ref <18)

## 2019-02-26 LAB — POC URINE PREG, ED
Preg Test, Ur: NEGATIVE
Preg Test, Ur: NEGATIVE

## 2019-02-26 LAB — D-DIMER, QUANTITATIVE: D-Dimer, Quant: 1.17 ug/mL-FEU — ABNORMAL HIGH (ref 0.00–0.50)

## 2019-02-26 MED ORDER — IOHEXOL 350 MG/ML SOLN
100.0000 mL | Freq: Once | INTRAVENOUS | Status: AC | PRN
  Administered 2019-02-26: 100 mL via INTRAVENOUS

## 2019-02-26 MED ORDER — TECHNETIUM TO 99M ALBUMIN AGGREGATED
1.5000 | Freq: Once | INTRAVENOUS | Status: AC | PRN
  Administered 2019-02-26: 1.6 via INTRAVENOUS

## 2019-02-26 MED ORDER — PANTOPRAZOLE SODIUM 20 MG PO TBEC: 20.0000 mg | DELAYED_RELEASE_TABLET | Freq: Every day | ORAL | 0 refills | Status: DC

## 2019-02-26 NOTE — ED Notes (Signed)
Awaiting VQ scan

## 2019-02-26 NOTE — ED Notes (Signed)
Delay explained- pt to BR  Awaiting VQ

## 2019-02-26 NOTE — ED Notes (Signed)
To Rad 

## 2019-02-26 NOTE — ED Triage Notes (Signed)
Pt states she has been having chest pain since Wednesday night. No other symptoms and reproducible on palpation. Seen last night for same but left before treatment complete due to wait.

## 2019-02-26 NOTE — ED Notes (Signed)
To VQ

## 2019-02-26 NOTE — Discharge Instructions (Signed)
Take the protonix as directed for at least two weeks.  Follow-up with your primary doctor next week for recheck.  I have also included the referral info for the local cardiology office.

## 2019-02-26 NOTE — ED Notes (Signed)
Call to Rad for "heads up" VQ scan   They will call [facility]

## 2019-02-26 NOTE — ED Provider Notes (Signed)
[facility] EMERGENCY DEPARTMENT Provider Note   CSN: [id number] Arrival date & time: 02/26/19  1601     History   Chief Complaint Chief Complaint  Patient presents with   Chest Pain    HPI Theresa Rosario is a 33 y.o. female.     HPI   Theresa Rosario is a 33 y.o. female with PMH of HTN, fibromyalgia, anxiety who presents to the Emergency Department complaining of persistent substernal chest pain that began 3 days ago.  She states the pain has been persistent since onset.  She describes the pain as dull and pressure-like from her upper sternal area radiating toward her left breast.  She states the pain worsens when lying supine and improves somewhat when sitting up.  She does admit to heavy lifting at her job just prior to onset.  Chest pain is also aggravated by exertion and associated with mild intermittent episodes of shortness of breath.  She presented here last evening for the same, and had an EKG performed, but did not stay to be evaluated.  She denies fever, chills, cough, previous cardiac history or previous PE, she also denies smoking or illicit drug use.  No recent illness or known Covid exposures.  She has taken Tylenol and over-the-counter antacids without relief.    Past Medical History:  Diagnosis Date   Blood in urine 10/18/2015   Fibromyalgia    Hx of hematuria    Hx of ovarian cyst    Hypertension    Kidney stone    Large breasts    Mental disorder    depression   Migraine     Patient Active Problem List   Diagnosis Date Noted   Family history of colon cancer in mother 02/09/2019   Encounter for gynecological examination with Papanicolaou smear of cervix 02/09/2019   Routine medical exam 02/09/2019   Missed periods 01/06/2019   Hypertension 01/06/2019   Pregnancy examination or test, negative result 01/06/2019   Elevated glucose 12/30/2018   Anemia 12/30/2018   Chronic migraine 08/17/2018   Muscle spasm of both lower  legs 12/02/2017   Muscle spasm of back 12/02/2017   Elevated blood-pressure reading without diagnosis of hypertension 12/02/2017   Irregular bleeding 12/02/2017   Weight gain 10/23/2016   Blood in urine 10/18/2015   Condyloma of female genitalia 10/21/2012   Fibromyalgia 07/13/2012   Anxiety 07/13/2012   Hx of hematuria 06/29/2012   Lumbago 11/11/2011    Past Surgical History:  Procedure Laterality Date   DENTAL SURGERY     WISDOM TOOTH EXTRACTION Bilateral      OB History    Gravida  2   Para  1   Term  1   Preterm      AB  1   Living  1     SAB      TAB      Ectopic      Multiple      Live Births  1            Home Medications    Prior to Admission medications   Medication Sig Start Date End Date Taking? Authorizing Provider  acetaminophen (TYLENOL) 500 MG tablet Take 1,000 mg by mouth every 8 (eight) hours as needed for mild pain.     [provider]  amLODipine (NORVASC) 10 MG tablet Take 1 tablet (10 mg total) by mouth daily. Patient taking differently: Take 20 mg by mouth daily.  02/03/19   Dorette Grateorum, Lisa  L, MD  clindamycin (CLEOCIN) 300 MG capsule Take 1 capsule (300 mg total) by mouth 3 (three) times daily. 01/30/19   Wallis Bamberg, PA-C  cyclobenzaprine (FLEXERIL) 10 MG tablet Take 20 mg by mouth at bedtime. 01/30/19   [provider]  diphenhydrAMINE (BENADRYL ALLERGY) 25 mg capsule     [provider]  escitalopram (LEXAPRO) 20 MG tablet Take 1.5 tablets (30 mg total) by mouth daily. 11/24/18   Levert Feinstein, MD  fluconazole (DIFLUCAN) 150 MG tablet Take 1 tablet (150 mg total) by mouth once a week. 01/30/19   Wallis Bamberg, PA-C  ibuprofen (ADVIL) 200 MG tablet     [provider]  lidocaine (XYLOCAINE) 2 % solution Use as directed 15 mLs in the mouth or throat every 6 (six) hours as needed for mouth pain. 01/30/19   Wallis Bamberg, PA-C  LORazepam (ATIVAN) 0.5 MG tablet Take 0.5 mg by mouth every 8 (eight) hours  as needed for anxiety.    [provider]  MELATONIN PO Take by mouth as needed.    [provider]  Prenatal Vit-Fe Fumarate-FA (PREPLUS) 27-1 MG TABS Take 1 tablet by mouth daily. 08/27/18   Adline Potter, NP  propranolol ER (INDERAL LA) 60 MG 24 hr capsule Take 1 capsule (60 mg total) by mouth daily. 08/17/18 11/29/18  Levert Feinstein, MD  zonisamide (ZONEGRAN) 100 MG capsule Take 2 capsules (200 mg total) by mouth at bedtime. Patient not taking: Reported on 08/27/2018 08/17/18 11/29/18  Levert Feinstein, MD    Family History Family History  Problem Relation Age of Onset   Cancer Mother 57       colon   Diabetes Mother    Hypertension Mother    Parkinson's disease Mother    Hypertension Father    Heart disease Maternal Grandfather    Coronary artery disease Other    Diabetes Other    Diabetes Paternal Grandmother    Hypertension Paternal Grandmother    Diabetes Maternal Grandmother     Social History Social History   Tobacco Use   Smoking status: Never Smoker   Smokeless tobacco: Never Used  Substance Use Topics   Alcohol use: Yes    Comment: socially   Drug use: No     Allergies   Amoxicillin and Motrin [ibuprofen]   Review of Systems Review of Systems  Constitutional: Negative for appetite change, chills and fever.  HENT: Negative for congestion and sore throat.   Respiratory: Positive for shortness of breath. Negative for cough and wheezing.   Cardiovascular: Positive for chest pain.  Gastrointestinal: Negative for abdominal pain, diarrhea, nausea and vomiting.  Genitourinary: Negative for dysuria and flank pain.  Musculoskeletal: Negative for arthralgias.  Skin: Negative for rash.  Neurological: Negative for dizziness, weakness, numbness and headaches.     Physical Exam Updated Vital Signs BP 129/78 (BP Location: Right Arm)    Pulse (!) 107    Temp 98.9 F (37.2 C) (Oral)    Resp 18    Ht 5\' 2"  (1.575 m)    Wt 114.3 kg    SpO2 100%     BMI 46.09 kg/m   Physical Exam Vitals signs and nursing note reviewed.  Constitutional:      General: She is not in acute distress.    Appearance: Normal appearance. She is well-developed. She is not ill-appearing.     Comments: Elevated BMI  HENT:     Mouth/Throat:     Mouth: Mucous membranes are moist.  Neck:     Musculoskeletal: Normal range of motion. No muscular tenderness.  Cardiovascular:     Rate and Rhythm: Regular rhythm. Tachycardia present.     Pulses: Normal pulses.  Pulmonary:     Effort: Pulmonary effort is normal. No respiratory distress.     Breath sounds: Normal breath sounds. No wheezing.  Chest:     Chest wall: Tenderness (Midsternal chest pain and left upper chest pain that is reproducible with palpation.) present.  Abdominal:     Palpations: Abdomen is soft. There is no mass.     Tenderness: There is no abdominal tenderness. There is no guarding.  Musculoskeletal:        General: No tenderness.     Right lower leg: Edema present.     Left lower leg: Edema present.     Comments: 1+ edema of the bilateral lower extremities.  No excessive warmth or erythema.  Lymphadenopathy:     Cervical: No cervical adenopathy.  Skin:    General: Skin is warm.     Capillary Refill: Capillary refill takes less than 2 seconds.     Findings: No erythema or rash.  Neurological:     General: No focal deficit present.     Mental Status: She is alert.     Sensory: No sensory deficit.     Motor: No weakness.     ED Treatments / Results  Labs (all labs ordered are listed, but only abnormal results are displayed) Labs Reviewed  CBC - Abnormal; Notable for the following components:      Result Value   WBC 11.7 (*)    RBC 5.33 (*)    Hemoglobin 11.1 (*)    MCV 71.5 (*)    MCH 20.8 (*)    MCHC 29.1 (*)    RDW 22.2 (*)    All other components within normal limits  D-DIMER, QUANTITATIVE (NOT AT [facility]) - Abnormal; Notable for the following components:   D-Dimer, Quant  1.17 (*)    All other components within normal limits  BASIC METABOLIC PANEL  POC URINE PREG, ED  POC URINE PREG, ED  TROPONIN I (HIGH SENSITIVITY)  TROPONIN I (HIGH SENSITIVITY)    EKG EKG Interpretation  Date/Time:  Saturday February 26 2019 16:25:51 EST Ventricular Rate:  105 PR Interval:    QRS Duration: 83 QT Interval:  340 QTC Calculation: 450 R Axis:   68 Text Interpretation: Sinus tachycardia ST elevation suggests acute pericarditis No significant change since last tracing Confirmed by Vanetta Mulders [id number]) on 02/26/2019 4:36:29 PM   Radiology Ct Angio Chest Pe W And/or Wo Contrast  Result Date: 02/26/2019 CLINICAL DATA:  33 year old female with chest pain. Concern for pulmonary embolism. EXAM: CT ANGIOGRAPHY CHEST WITH CONTRAST TECHNIQUE: Multidetector CT imaging of the chest was performed using the standard protocol during bolus administration of intravenous contrast. Multiplanar CT image reconstructions and MIPs were obtained to evaluate the vascular anatomy. CONTRAST:  OMNIPAQUE IOHEXOL 350 MG/ML SOLN COMPARISON:  Chest CT dated 07/03/2006 and chest radiograph dated 02/26/2019. FINDINGS: Evaluation is limited due to streak artifact caused by patient's body habitus. Cardiovascular: Top-normal cardiac size. There is no pericardial effusion. The thoracic aorta is unremarkable. The origins of the great vessels of the aortic arch appear patent as visualized. Evaluation of the pulmonary arteries is severely limited due to respiratory motion artifact and suboptimal opacification of the pulmonary artery branches and timing of the contrast. No definite large or central pulmonary artery embolus identified. Mediastinum/Nodes: There  is no hilar or mediastinal adenopathy. The esophagus is grossly unremarkable. No mediastinal fluid collection. Lungs/Pleura: Minimal bibasilar atelectatic changes. No focal consolidation, pleural effusion, or pneumothorax. The central airways are patent.  Upper Abdomen: Fatty infiltration of the liver. Musculoskeletal: No chest wall abnormality. No acute or significant osseous findings. Review of the MIP images confirms the above findings. IMPRESSION: 1. No acute intrathoracic pathology. No CT evidence of large central pulmonary artery embolus. V/Q scan may provide better evaluation if there is high clinical concern for acute PE. 2. Fatty liver. Electronically Signed   By: Anner Crete M.D.   On: 02/26/2019 19:35   Nm Pulmonary Perfusion  Result Date: 02/26/2019 CLINICAL DATA:  33 year old female with pleuritic chest pain. Concern for pulmonary embolism. EXAM: NUCLEAR MEDICINE PERFUSION LUNG SCAN TECHNIQUE: Perfusion images were obtained in multiple projections after intravenous injection of radiopharmaceutical. Ventilation scans intentionally deferred if perfusion scan and chest x-ray adequate for interpretation during COVID 19 epidemic. RADIOPHARMACEUTICALS:  1.6 mCi Tc-38m MAA IV COMPARISON:  Chest radiograph dated 02/26/2019 and CT dated 02/26/2019 FINDINGS: There is normal and homogeneous perfusion throughout both lungs. No perfusion defect identified. IMPRESSION: Normal lung perfusion. Electronically Signed   By: Anner Crete M.D.   On: 02/26/2019 23:19   Dg Chest Port 1 View  Result Date: 02/26/2019 CLINICAL DATA:  Chest pain EXAM: PORTABLE CHEST 1 VIEW COMPARISON:  Chest radiograph dated 01/02/2006. FINDINGS: The heart size and mediastinal contours are within normal limits. Both lungs are clear. The visualized skeletal structures are unremarkable. IMPRESSION: No active disease. Electronically Signed   By: Zerita Boers M.D.   On: 02/26/2019 18:04    Procedures Procedures (including critical care time)  Medications Ordered in ED Medications - No data to display   Initial Impression / Assessment and Plan / ED Course  I have reviewed the triage vital signs and the nursing notes.  Pertinent labs & imaging results that were available  during my care of the patient were reviewed by me and considered in my medical decision making (see chart for details).       Patient with 3-day history of midsternal chest pain that is reproducible with palpation.  She is mildly tachycardic, no tachypnea or hypoxia.  She is well-appearing.  Nontoxic.  Comparison of EKG from last evening is unchanged.  Obtain labs, chest x-ray and d-dimer.  Hx concerning for possible PE. Low clinical suspicion for ACS.      D dimer is elevated, CT angio ordered.   CT angio without evidence for large PE, recommending V/Q scan   On recheck, pt resting comfortably.  V/Q scan showing normal perfusion. Tachycardia improved.   Work up reassuring.  Discussed with Dr. Rogene Houston.  Pt appears appropriate for d/c home, agrees to close f/u with PCP.  Will try PPI as well.  Return precautions discussed   Final Clinical Impressions(s) / ED Diagnoses   Final diagnoses:  Precordial pain    ED Discharge Orders    None       Kem Parkinson, PA-C 02/27/19 [id number]    Fredia Sorrow, MD 02/27/19 [id number]

## 2019-03-07 ENCOUNTER — Other Ambulatory Visit: Payer: Self-pay

## 2019-03-07 ENCOUNTER — Telehealth (INDEPENDENT_AMBULATORY_CARE_PROVIDER_SITE_OTHER): Payer: Medicaid Other | Admitting: Family Medicine

## 2019-03-07 DIAGNOSIS — D649 Anemia, unspecified: Secondary | ICD-10-CM

## 2019-03-07 DIAGNOSIS — M62830 Muscle spasm of back: Secondary | ICD-10-CM

## 2019-03-07 DIAGNOSIS — N925 Other specified irregular menstruation: Secondary | ICD-10-CM | POA: Diagnosis not present

## 2019-03-07 DIAGNOSIS — I10 Essential (primary) hypertension: Secondary | ICD-10-CM | POA: Diagnosis not present

## 2019-03-07 DIAGNOSIS — N926 Irregular menstruation, unspecified: Secondary | ICD-10-CM

## 2019-03-07 DIAGNOSIS — M791 Myalgia, unspecified site: Secondary | ICD-10-CM | POA: Diagnosis not present

## 2019-03-07 MED ORDER — CYCLOBENZAPRINE HCL 10 MG PO TABS: 10.0000 mg | ORAL_TABLET | Freq: Every day | ORAL | 5 refills | Status: DC

## 2019-03-07 NOTE — Progress Notes (Signed)
Virtual Visit via Telephone Note  I connected with JENESYS CASSEUS on 03/07/19 at  1:00 PM EST by telephone and verified that I am speaking with the correct person using two identifiers. DOB/address  Location: Patient: home Provider:clinic   I discussed the limitations, risks, security and privacy concerns of performing an evaluation and management service by telephone and the availability of in person appointments. I also discussed with the patient that there may be a patient responsible charge related to this service. The patient expressed understanding and agreed to proceed.   History of Present Illness: Seen in ER for chest pain-evaluation including labwork and ECG Pt seen by GYN for evaluation Nosebleeds-pt notes when upset-lasts for 39minutes-stops with pressure-noted in the past. Pt states she had nosebleeds during pregnancy.  NO congestion. NO sneezing. NO nasal sprays Anemia in the past-takes MV with iron daily Request refill on flexeril-uses for muscle spasms related to fibromyalgia-daily at night  Takes amlodipine 10mg  daily at night Observations/Objective: 120-138/80-90-home bp monitor  Assessment and Plan: 1. Anemia, unspecified type Concern for nosebleeds -cbc with continued microcytic anemia - Fe+TIBC+Fer - Ferritin - Transferrin - Ambulatory referral to ENT  2. Myalgia Flexeril-rx-takes nightly   3. Hypertension, unspecified type Stable on amlodipine  4. Muscle spasm of back Uses flexeril  5. Irregular bleeding Gyn following-concern for worsening anemia  Follow Up Instructions:  ENT referral-nosebleeds over last few weeks with no h/o of congestion/nasal sprays or dryness Iron/TIBC/Ferritin/Transferrin-order with follow up information   I discussed the assessment and treatment plan with the patient. The patient was provided an opportunity to ask questions and all were answered. The patient agreed with the plan and demonstrated an understanding of the  instructions.   The patient was advised to call back or seek an in-person evaluation if the symptoms worsen or if the condition fails to improve as anticipated.  I provided 10 minutes of non-face-to-face time during this encounter.   Lilianne Delair Hannah Beat, MD

## 2019-03-07 NOTE — Patient Instructions (Signed)
ENT referral for nose bleeds Labwork for anemia Continue taking amlodipine at night Continued taking your Multi-vitamin with iron Refilled your flexeril

## 2019-03-09 ENCOUNTER — Encounter: Payer: Self-pay | Admitting: Nutrition

## 2019-03-09 NOTE — Patient Instructions (Signed)
Goals Follow MY Plate Eat 3 meals a day at times discussed Cut out snacks, processed foods and fried foods. Increase fresh fruits and vegetables. Drink only water and cut out sodas, juice and sugar beverages. Lose 1-2 lbs per week 

## 2019-03-24 ENCOUNTER — Ambulatory Visit: Payer: Medicaid Other | Admitting: Nurse Practitioner

## 2019-03-25 DIAGNOSIS — G4733 Obstructive sleep apnea (adult) (pediatric): Secondary | ICD-10-CM | POA: Diagnosis not present

## 2019-03-28 ENCOUNTER — Encounter: Payer: Self-pay | Admitting: Family Medicine

## 2019-03-28 ENCOUNTER — Other Ambulatory Visit: Payer: Self-pay | Admitting: Family Medicine

## 2019-03-28 DIAGNOSIS — M797 Fibromyalgia: Secondary | ICD-10-CM

## 2019-03-31 ENCOUNTER — Ambulatory Visit: Payer: Medicaid Other | Admitting: Nurse Practitioner

## 2019-03-31 ENCOUNTER — Other Ambulatory Visit: Payer: Self-pay

## 2019-03-31 ENCOUNTER — Telehealth: Payer: Self-pay | Admitting: *Deleted

## 2019-03-31 ENCOUNTER — Encounter: Payer: Self-pay | Admitting: Nurse Practitioner

## 2019-03-31 VITALS — BP 145/91 | HR 92 | Temp 97.8°F | Ht 62.0 in | Wt 263.4 lb

## 2019-03-31 DIAGNOSIS — Z800 Family history of malignant neoplasm of digestive organs: Secondary | ICD-10-CM

## 2019-03-31 DIAGNOSIS — Z0000 Encounter for general adult medical examination without abnormal findings: Secondary | ICD-10-CM | POA: Insufficient documentation

## 2019-03-31 DIAGNOSIS — K219 Gastro-esophageal reflux disease without esophagitis: Secondary | ICD-10-CM | POA: Diagnosis not present

## 2019-03-31 MED ORDER — PANTOPRAZOLE SODIUM 20 MG PO TBEC: 20.0000 mg | DELAYED_RELEASE_TABLET | Freq: Every day | ORAL | 3 refills | Status: DC

## 2019-03-31 NOTE — Progress Notes (Signed)
Primary Care Physician:  Maryruth Hancock, MD Primary Gastroenterologist:  Dr. Gala Romney  Chief Complaint  Patient presents with  . Consult    TCS. Mother diagnosed with colon cancer age 34    HPI:   Theresa Rosario is a 34 y.o. female who presents on referral from OB/GYN for consideration of colonoscopy.  Nurse/phone triage was deferred office visit due to medications and BMI likely necessitating augmented sedation with propofol.  Reviewed information associated with referral including office visit dated 02/09/2019.  At that time it was noted she has a family history of colon cancer in her mother who was diagnosed at age 22. Subsequently the patient was referred to GI.  No history of colonoscopy in our system.  Today she states she's doing ok overall. Her mom was diagnosed with CRCR age 39 s/p sugical resection; not sure if she had chemo and/or radiation. Her mother is still living. Has intermittent abdominal pain about a couple times a week described as "like extreme hunger pain" and lasts a couple minutes. Self resolves at times, other times will go away if she eats. Occasionally has nausea but no vomiting. She does note a lot of GERD symptoms including reflux, excessive belching. She is on Protonix 20 mg daily which she got from the ER last month and has no refills. Felt it worked pretty well. Denies hematochezia, melena, fever, chills, unintentional weight loss. Denies URI or flu-like symptoms. Denies loss of sense of taste or smell. Denies chest pain, dyspnea, dizziness, lightheadedness, syncope, near syncope. Denies any other upper or lower GI symptoms.  She takes Advil for fibromyalgia, Tylenol often doesn't help. Will take a couple times a day or as little as once every few weeks.  Past Medical History:  Diagnosis Date  . Blood in urine 10/18/2015  . Depression   . Fibromyalgia   . Hx of hematuria   . Hx of ovarian cyst   . Hypertension   . Kidney stone   . Large breasts   .  Migraine     Past Surgical History:  Procedure Laterality Date  . DENTAL SURGERY    . WISDOM TOOTH EXTRACTION Bilateral     Current Outpatient Medications  Medication Sig Dispense Refill  . acetaminophen (TYLENOL) 500 MG tablet Take 500-1,000 mg by mouth every 8 (eight) hours as needed for mild pain.     Marland Kitchen amLODipine (NORVASC) 10 MG tablet Take 1 tablet (10 mg total) by mouth daily. (Patient taking differently: Take 20 mg by mouth every evening. ) 30 tablet 1  . cyclobenzaprine (FLEXERIL) 10 MG tablet Take 1 tablet (10 mg total) by mouth at bedtime. 30 tablet 5  . escitalopram (LEXAPRO) 20 MG tablet Take 1.5 tablets (30 mg total) by mouth daily. 140 tablet 4  . hydrocortisone cream 1 % Apply 1 application topically daily as needed for itching.    Marland Kitchen ibuprofen (ADVIL) 200 MG tablet Take 400-800 mg by mouth daily as needed for mild pain or moderate pain.     Marland Kitchen LORazepam (ATIVAN) 0.5 MG tablet Take 0.5 mg by mouth every 8 (eight) hours as needed for anxiety.    Marland Kitchen MAGNESIUM OXIDE PO Take 1 tablet by mouth every 14 (fourteen) days.    Marland Kitchen MELATONIN PO Take 1 tablet by mouth at bedtime as needed (for sleep).     . Menthol, Topical Analgesic, (BIOFREEZE EX) Apply 1 application topically daily as needed (for pain).    . pantoprazole (PROTONIX) 20 MG tablet  Take 1 tablet (20 mg total) by mouth daily. 30 tablet 0  . Prenatal Vit-Fe Fumarate-FA (PREPLUS) 27-1 MG TABS Take 1 tablet by mouth daily. 30 tablet 12  . clindamycin (CLEOCIN) 300 MG capsule Take 1 capsule (300 mg total) by mouth 3 (three) times daily. (Patient not taking: Reported on 02/26/2019) 21 capsule 0  . fluconazole (DIFLUCAN) 150 MG tablet Take 1 tablet (150 mg total) by mouth once a week. (Patient not taking: Reported on 02/26/2019) 2 tablet 0   No current facility-administered medications for this visit.    Allergies as of 03/31/2019 - Review Complete 03/31/2019  Allergen Reaction Noted  . Amoxicillin Hives 01/06/2019  . Motrin  [ibuprofen] Rash 01/01/2015    Family History  Problem Relation Age of Onset  . Diabetes Mother   . Hypertension Mother   . Parkinson's disease Mother   . Colon cancer Mother 4  . Hypertension Father   . Heart disease Maternal Grandfather   . Coronary artery disease Other   . Diabetes Other   . Diabetes Paternal Grandmother   . Hypertension Paternal Grandmother   . Diabetes Maternal Grandmother     Social History   Socioeconomic History  . Marital status: Significant Other    Spouse name: Not on file  . Number of children: 1  . Years of education: college  . Highest education level: Master's degree (e.g., MA, MS, MEng, MEd, MSW, MBA)  Occupational History  . 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 Smoking status: Never Smoker  . Smokeless tobacco: Never Used  . Tobacco comment: occasional vape  Substance and Sexual Activity  . Alcohol use: Yes    Comment: socially  . Drug use: No  . Sexual activity: Yes    Birth control/protection: None  Other Topics Concern  . Not on file  Social History Narrative   Lives at home with boyfriend and daughter.   Right-handed.   2-3 cups caffeine per day.   Social Determinants of Health   Financial Resource Strain:   . Difficulty of Paying Living Expenses: Not on file  Food Insecurity:   . Worried About Programme researcher, broadcasting/film/video in the Last Year: Not on file  . Ran Out of Food in the Last Year: Not on file  Transportation Needs:   . Lack of Transportation (Medical): Not on file  . Lack of Transportation (Non-Medical): Not on file  Physical Activity:   . Days of Exercise per Week: Not on file  . Minutes of Exercise per Session: Not on file  Stress:   . Feeling of Stress : Not on file  Social Connections:   . Frequency of Communication with Friends and Family: Not on file  . Frequency of Social Gatherings with Friends and Family: Not on file  . Attends Religious Services: Not on file  . Active Member of  Clubs or Organizations: Not on file  . Attends Banker Meetings: Not on file  . Marital Status: Not on file  Intimate Partner Violence:   . Fear of Current or Ex-Partner: Not on file  . Emotionally Abused: Not on file  . Physically Abused: Not on file  . Sexually Abused: Not on file    Review of Systems: General: Negative for anorexia, weight loss, fever, chills, fatigue, weakness. ENT: Negative for hoarseness, difficulty swallowing. CV: Negative for chest pain, angina, palpitations, peripheral edema.  Respiratory: Negative for dyspnea at rest, cough, sputum, wheezing.  GI: See history  of present illness.  MS: Admits fibromyalgia.  Derm: Negative for rash or itching.  Endo: Negative for unusual weight change.  Heme: Negative for bruising or bleeding. Allergy: Negative for rash or hives.    Physical Exam: BP (!) 145/91   Pulse 92   Temp 97.8 F (36.6 C) (Oral)   Ht 5\' 2"  (1.575 m)   Wt 263 lb 6.4 oz (119.5 kg)   LMP 03/03/2019   BMI 48.18 kg/m  General:   Alert and oriented. Pleasant and cooperative. Well-nourished and well-developed.  Head:  Normocephalic and atraumatic. Eyes:  Without icterus, sclera clear and conjunctiva pink.  Ears:  Normal auditory acuity. Cardiovascular:  S1, S2 present without murmurs appreciated. Extremities without clubbing or edema. Respiratory:  Clear to auscultation bilaterally. No wheezes, rales, or rhonchi. No distress.  Gastrointestinal:  +BS, soft, non-tender and non-distended. No HSM noted. No guarding or rebound. No masses appreciated.  Rectal:  Deferred  Musculoskalatal:  Symmetrical without gross deformities. Neurologic:  Alert and oriented x4;  grossly normal neurologically. Psych:  Alert and cooperative. Normal mood and affect. Heme/Lymph/Immune: No excessive bruising noted.    03/31/2019 1:52 PM   Disclaimer: This note was dictated with voice recognition software. Similar sounding words can inadvertently be  transcribed and may not be corrected upon review.

## 2019-03-31 NOTE — Assessment & Plan Note (Signed)
The patient has a family history of colon cancer in her mother who was diagnosed at age 34.  Apparent status post partial colectomy with a temporary ostomy and ostomy reversal.  She thinks she may have had some mild radiation, but not sure if she had a lot of radiation and/or chemotherapy.  Her mother is still alive.  Given that her mother was diagnosed at age 54 it is appropriate for her to have her first screening at age 9.  Generally asymptomatic from a GI standpoint other than abdominal pain likely related to GERD symptoms as per above.  We will proceed with colonoscopy at this time.  Proceed with TCS on propofol/MAC with Dr. Gala Romney in near future: the risks, benefits, and alternatives have been discussed with the patient in detail. The patient states understanding and desires to proceed.  The patient is currently on Flexeril, Ativan.  Social alcohol.  Denies drug use.  No other anticoagulants, anxiolytics, chronic pain medications, antidepressants, antidiabetics.  She is on iron supplement we will have her hold this for 7 days prior to her procedure to prevent iron staining.  We will plan for the procedure on propofol/MAC to promote adequate sedation.

## 2019-03-31 NOTE — Telephone Encounter (Signed)
LMOVM

## 2019-03-31 NOTE — Progress Notes (Signed)
Cc'ed to pcp °

## 2019-03-31 NOTE — Patient Instructions (Signed)
Your health issues we discussed today were:   Family history of colon cancer: 1. It is time/appropriate for your first ever colonoscopy for colon cancer screening given your family history 2. We will schedule your colonoscopy for you 3. Further recommendations will follow  GERD (reflux/heartburn) with abdominal discomfort: 1. I will refill Protonix 20 mg for you.  Take this once a day, pursing in the morning on an empty stomach 2. Call us if you have any worsening or severe symptoms  Overall I recommend:  1. Continue your other current medications 2. Return for follow-up as needed for GI symptoms 3. Call us if you have any questions or concerns.   Because of recent events of COVID-19 ("Coronavirus"), follow CDC recommendations:  Wash your hand frequently Avoid touching your face Stay away from people who are sick If you have symptoms such as fever, cough, shortness of breath then call your healthcare provider for further guidance If you are sick, STAY AT HOME unless otherwise directed by your healthcare provider. Follow directions from state and national officials regarding staying safe   At [facility] we value your feedback. You may receive a survey about your visit today. Please share your experience as we strive to create trusting relationships with our patients to provide genuine, compassionate, quality care.  We appreciate your understanding and patience as we review any laboratory studies, imaging, and other diagnostic tests that are ordered as we care for you. Our office policy is 5 business days for review of these results, and any emergent or urgent results are addressed in a timely manner for your best interest. If you do not hear from our office in 1 week, please contact us.   We also encourage the use of MyChart, which contains your medical information for your review as well. If you are not enrolled in this feature, an access code is on this after visit  summary for your convenience. Thank you for allowing [name] to be involved in your care.  It was great to see you today!  I hope you have a Happy New Year!!

## 2019-03-31 NOTE — Assessment & Plan Note (Signed)
The patient describes GERD symptoms.  She had previously having this up to the point that she was in the emergency room for chest pain or cardiac work-up and rule out.  She was given Protonix 20 mg daily.  She felt this controlled her symptoms pretty well but she has since run out.  I will refill her Protonix for her.  Call us for any worsening or severe symptoms.  Follow-up as needed.

## 2019-04-05 ENCOUNTER — Telehealth: Payer: Self-pay | Admitting: Family Medicine

## 2019-04-05 NOTE — Telephone Encounter (Signed)
Use ayr-gel or other nasal saline spray to keep tissue moist.  Avoid rubbing nose or blowing. Call ENT to see if cancellation for an early time if continued concerns.

## 2019-04-05 NOTE — Telephone Encounter (Signed)
Routing to Dr. Corum 

## 2019-04-05 NOTE — Telephone Encounter (Signed)
Patient is aware of the recommendation 

## 2019-04-05 NOTE — Telephone Encounter (Signed)
Patient would like recommendations for frequent nose bleeds until she can be seen by ENT.

## 2019-04-13 ENCOUNTER — Ambulatory Visit: Payer: Medicaid Other | Admitting: Nutrition

## 2019-04-21 ENCOUNTER — Telehealth: Payer: Self-pay | Admitting: *Deleted

## 2019-04-21 NOTE — Telephone Encounter (Signed)
Called pt to schedule her procedure.  She said that she would have to call us back when she can check her calendar.

## 2019-04-25 DIAGNOSIS — G4733 Obstructive sleep apnea (adult) (pediatric): Secondary | ICD-10-CM | POA: Diagnosis not present

## 2019-04-27 DIAGNOSIS — M542 Cervicalgia: Secondary | ICD-10-CM | POA: Diagnosis not present

## 2019-04-27 DIAGNOSIS — M797 Fibromyalgia: Secondary | ICD-10-CM | POA: Diagnosis not present

## 2019-04-27 DIAGNOSIS — G894 Chronic pain syndrome: Secondary | ICD-10-CM | POA: Diagnosis not present

## 2019-04-27 DIAGNOSIS — M545 Low back pain: Secondary | ICD-10-CM | POA: Diagnosis not present

## 2019-04-27 DIAGNOSIS — G4733 Obstructive sleep apnea (adult) (pediatric): Secondary | ICD-10-CM | POA: Diagnosis not present

## 2019-05-02 DIAGNOSIS — R040 Epistaxis: Secondary | ICD-10-CM | POA: Diagnosis not present

## 2019-05-06 ENCOUNTER — Other Ambulatory Visit: Payer: Self-pay | Admitting: Adult Health

## 2019-05-19 ENCOUNTER — Other Ambulatory Visit: Payer: Self-pay

## 2019-05-19 DIAGNOSIS — I10 Essential (primary) hypertension: Secondary | ICD-10-CM

## 2019-05-19 MED ORDER — AMLODIPINE BESYLATE 10 MG PO TABS: 10.0000 mg | ORAL_TABLET | Freq: Every day | ORAL | 1 refills | Status: DC

## 2019-05-23 DIAGNOSIS — G4733 Obstructive sleep apnea (adult) (pediatric): Secondary | ICD-10-CM | POA: Diagnosis not present

## 2019-05-26 DIAGNOSIS — Z20828 Contact with and (suspected) exposure to other viral communicable diseases: Secondary | ICD-10-CM | POA: Diagnosis not present

## 2019-06-02 DIAGNOSIS — Z20828 Contact with and (suspected) exposure to other viral communicable diseases: Secondary | ICD-10-CM | POA: Diagnosis not present

## 2019-06-02 DIAGNOSIS — Z23 Encounter for immunization: Secondary | ICD-10-CM | POA: Diagnosis not present

## 2019-06-08 DIAGNOSIS — M545 Low back pain, unspecified: Secondary | ICD-10-CM | POA: Insufficient documentation

## 2019-06-08 DIAGNOSIS — G8929 Other chronic pain: Secondary | ICD-10-CM | POA: Insufficient documentation

## 2019-06-08 DIAGNOSIS — G4733 Obstructive sleep apnea (adult) (pediatric): Secondary | ICD-10-CM | POA: Diagnosis not present

## 2019-06-08 DIAGNOSIS — M797 Fibromyalgia: Secondary | ICD-10-CM | POA: Diagnosis not present

## 2019-06-08 DIAGNOSIS — M542 Cervicalgia: Secondary | ICD-10-CM | POA: Diagnosis not present

## 2019-06-16 DIAGNOSIS — Z20828 Contact with and (suspected) exposure to other viral communicable diseases: Secondary | ICD-10-CM | POA: Diagnosis not present

## 2019-06-23 DIAGNOSIS — G4733 Obstructive sleep apnea (adult) (pediatric): Secondary | ICD-10-CM | POA: Diagnosis not present

## 2019-06-30 ENCOUNTER — Other Ambulatory Visit: Payer: Self-pay

## 2019-06-30 ENCOUNTER — Encounter: Payer: Self-pay | Admitting: Advanced Practice Midwife

## 2019-06-30 ENCOUNTER — Ambulatory Visit (INDEPENDENT_AMBULATORY_CARE_PROVIDER_SITE_OTHER): Payer: Medicaid Other | Admitting: Advanced Practice Midwife

## 2019-06-30 VITALS — BP 130/85 | HR 93 | Ht 61.0 in | Wt 263.0 lb

## 2019-06-30 DIAGNOSIS — Z3202 Encounter for pregnancy test, result negative: Secondary | ICD-10-CM

## 2019-06-30 DIAGNOSIS — N914 Secondary oligomenorrhea: Secondary | ICD-10-CM | POA: Diagnosis not present

## 2019-06-30 LAB — POCT URINE PREGNANCY: Preg Test, Ur: NEGATIVE

## 2019-06-30 MED ORDER — CLOMIPHENE CITRATE 50 MG PO TABS: ORAL_TABLET | ORAL | 3 refills | Status: DC

## 2019-06-30 MED ORDER — MEDROXYPROGESTERONE ACETATE 10 MG PO TABS: 10.0000 mg | ORAL_TABLET | Freq: Every day | ORAL | 0 refills | Status: DC

## 2019-06-30 NOTE — Patient Instructions (Signed)
CLOMID INSTRUCTIONS  WHY USE IT? Clomid helps your ovaries to release eggs (ovulate).  HOW TO USE IT? Clomid is taken as a pill usually on days 1,2,3,4,&5 of your cycle.  Day 1 is the first day of your period. The dose or duration may be changed to achieve ovulation.  Provera (progesterone) may first be used to bring on a period for some patients. The day of ovulation on Clomid is usually between cycle day 14 and 17.  Having sexual intercourse at least every other day between cycle day 13 and 18 will improve your chances of becoming pregnant during the Clomid cycle.  You may monitor your ovulation using basal body temperature charts or with ovulation kits.  If using the ovulation predictor kits, having intercourse the day of the surge and the two days following is recommended. If you get your period, call when it starts for an appointment with your doctor, so that an exam may be done, and another Clomid cycle can be considered if appropriate. If you do not get a period by day 35 of the cycle, please get a blood pregnancy test.  If it is negative, speak to your doctor for instructions to bring on another period and to plan a follow-up appointment.  THINGS TO KNOW: If you get pregnant while using Clomid, your chance of twins is 7%m and triplets is less than 1%. Some studies have suggested the use of "fertility drugs" may increase your risk of ovarian cancers in the future.  It is unclear if these drugs increase the risk, or people who have problems with fertility are prone for these cancers.  If there is an actual risk, it is very low.  If you have a history of liver problems or ovarian cancer, it may be wise to avoid this medication.  SIDE EFFECTS:  The most common side effect is hot flashes (20%).  Breast tenderness, headaches, nausea, bloating may also occur at different times.  Less than 3/1,000 people have dryness or loss of hair.  Persistent ovarian cysts may form from the use of this  medication.  Ovarian hyperstimulation syndrome is a rare side effect at low doses.  Visual changes like flashes of light or blurring.    

## 2019-06-30 NOTE — Progress Notes (Signed)
[facility] Visit  Patient name: Theresa Rosario MRN [id number]  Date of birth: 10/01/1985  CC & HPI:  Theresa Rosario is a 34 y.o. African American female presenting today for missed periods.  Has had one child.  Periods have always been irregular unless on BC. Had SAB in June 2020, has only had 3 periods since then.  Last one in January.Wants to be pregnant. Already on PNV  Pertinent History Reviewed:  Medical & Surgical Hx:   Past Medical History:  Diagnosis Date  . Blood in urine 10/18/2015  . Depression   . Fibromyalgia   . Hx of hematuria   . Hx of ovarian cyst   . Hypertension   . Kidney stone   . Large breasts   . Migraine    Past Surgical History:  Procedure Laterality Date  . DENTAL SURGERY    . WISDOM TOOTH EXTRACTION Bilateral    Family History  Problem Relation Age of Onset  . Diabetes Mother   . Hypertension Mother   . Parkinson's disease Mother   . Colon cancer Mother 7  . Hypertension Father   . Heart disease Maternal Grandfather   . Coronary artery disease Other   . Diabetes Other   . Diabetes Paternal Grandmother   . Hypertension Paternal Grandmother   . Diabetes Maternal Grandmother     Current Outpatient Medications:  .  acetaminophen (TYLENOL) 500 MG tablet, Take 500-1,000 mg by mouth every 8 (eight) hours as needed for mild pain. , Disp: , Rfl:  .  amLODipine (NORVASC) 10 MG tablet, Take 1 tablet (10 mg total) by mouth daily., Disp: 30 tablet, Rfl: 1 .  cyclobenzaprine (FLEXERIL) 10 MG tablet, Take 1 tablet (10 mg total) by mouth at bedtime., Disp: 30 tablet, Rfl: 5 .  DULoxetine (CYMBALTA) 30 MG capsule, 30 mg in the am and 60 mg in the pm., Disp: , Rfl:  .  hydrocortisone cream 1 %, Apply 1 application topically daily as needed for itching., Disp: , Rfl:  .  hydrOXYzine (ATARAX/VISTARIL) 25 MG tablet, Take 25-50 mg by mouth at bedtime as needed., Disp: , Rfl:  .  ibuprofen (ADVIL) 200 MG tablet, Take 400-800 mg by mouth  daily as needed for mild pain or moderate pain. , Disp: , Rfl:  .  LORazepam (ATIVAN) 0.5 MG tablet, Take 0.5 mg by mouth every 8 (eight) hours as needed for anxiety., Disp: , Rfl:  .  MAGNESIUM OXIDE PO, Take 1 tablet by mouth every 14 (fourteen) days., Disp: , Rfl:  .  MELATONIN PO, Take 1 tablet by mouth at bedtime as needed (for sleep). , Disp: , Rfl:  .  Menthol, Topical Analgesic, (BIOFREEZE EX), Apply 1 application topically daily as needed (for pain)., Disp: , Rfl:  .  pantoprazole (PROTONIX) 20 MG tablet, Take 1 tablet (20 mg total) by mouth daily., Disp: 90 tablet, Rfl: 3 .  Prenatal Vit-Fe Fumarate-FA (PREPLUS) 27-1 MG TABS, Take 1 tablet by mouth daily., Disp: 30 tablet, Rfl: 12 .  traMADol (ULTRAM) 50 MG tablet, , Disp: , Rfl:  .  clomiPHENE (CLOMID) 50 MG tablet, Take one by mouth daily on day 1-5 of your cycle, Disp: 5 tablet, Rfl: 3 .  medroxyPROGESTERone (PROVERA) 10 MG tablet, Take 1 tablet (10 mg total) by mouth daily., Disp: 10 tablet, Rfl: 0 Social History: Reviewed -  reports that she has never smoked. She has never used smokeless tobacco.  Review of Systems:   Negative  Objective Findings:    Physical Examination: Vitals:   06/30/19 1429  BP: 130/85  Pulse: 93   General appearance - well appearing, and in no distress.  Hirsutism Mental status - alert, oriented to person, place, and time Chest:  Normal respiratory effort Heart - normal rate and regular rhythm Pelvic: deferred Musculoskeletal:  Normal range of motion without pain Extremities:  No edema    No results found for this or any previous visit (from the past 24 hour(s)).    Assessment & Plan:  A:   Probably PCOS P:   Orders Placed This Encounter  Procedures  . Testosterone, Free, Total, SHBG  . TSH  . Follicle stimulating hormone  . Prolactin  . POCT urine pregnancy   If not thyroid/pituitary, start provera and clomid.  Track ovulation w/OPT, let me know if she is ovulation. To call  office for explanation of results if the show up in [place] before next Thursday, otherwise, I will advise her.    No follow-ups on file.  Christin Fudge CNM 06/30/2019 2:54 PM

## 2019-07-03 LAB — FOLLICLE STIMULATING HORMONE: FSH: 4.6 m[IU]/mL

## 2019-07-03 LAB — TESTOSTERONE, FREE, TOTAL, SHBG
Sex Hormone Binding: 43.4 nmol/L (ref 24.6–122.0)
Testosterone, Free: 1.8 pg/mL (ref 0.0–4.2)
Testosterone: 38 ng/dL (ref 8–48)

## 2019-07-03 LAB — PROLACTIN: Prolactin: 20.2 ng/mL (ref 4.8–23.3)

## 2019-07-03 LAB — TSH: TSH: 1.95 u[IU]/mL (ref 0.450–4.500)

## 2019-07-06 ENCOUNTER — Encounter: Payer: Self-pay | Admitting: Family Medicine

## 2019-07-06 ENCOUNTER — Telehealth: Payer: Self-pay | Admitting: Advanced Practice Midwife

## 2019-07-06 NOTE — Telephone Encounter (Signed)
Patient called stating that she would like a call back from the nurse regarding her bloodwork, pt states she does not understand it and would like help. Please contact pt

## 2019-07-07 ENCOUNTER — Other Ambulatory Visit: Payer: Self-pay

## 2019-07-07 ENCOUNTER — Ambulatory Visit: Payer: Medicaid Other | Admitting: Family Medicine

## 2019-07-07 ENCOUNTER — Encounter: Payer: Self-pay | Admitting: Family Medicine

## 2019-07-07 VITALS — BP 123/81 | HR 100 | Temp 97.6°F | Ht 61.0 in | Wt 263.6 lb

## 2019-07-07 DIAGNOSIS — R7309 Other abnormal glucose: Secondary | ICD-10-CM | POA: Diagnosis not present

## 2019-07-07 DIAGNOSIS — E119 Type 2 diabetes mellitus without complications: Secondary | ICD-10-CM

## 2019-07-07 DIAGNOSIS — L819 Disorder of pigmentation, unspecified: Secondary | ICD-10-CM | POA: Diagnosis not present

## 2019-07-07 LAB — POCT GLYCOSYLATED HEMOGLOBIN (HGB A1C): Hemoglobin A1C: 6.5 % — AB (ref 4.0–5.6)

## 2019-07-07 NOTE — Patient Instructions (Addendum)
   xyzal 5mg  for itch Derm referral Stop sugary drinks Dietitian referral If you have lab work done today you will be contacted with your lab results within the next 2 weeks.  If you have not heard from then please contact us. The fastest way to get your results is to register for My Chart.   IF you received an x-ray today, you will receive an invoice from [facility]. Please contact [facility] at [phone] with questions or concerns regarding your invoice.   IF you received labwork today, you will receive an invoice from [facility]. Please contact [facility] at [phone] with questions or concerns regarding your invoice.   Our billing staff will not be able to assist you with questions regarding bills from these companies.  You will be contacted with the lab results as soon as they are available. The fastest way to get your results is to activate your My Chart account. Instructions are located on the last page of this paperwork. If you have not heard from [phone] regarding the results in 2 weeks, please contact this office.

## 2019-07-07 NOTE — Progress Notes (Signed)
Established Patient Office Visit  Subjective:  Patient ID: Theresa Rosario, female    DOB: 08-10-1985  Age: 34 y.o. MRN: [id number]  CC:  Chief Complaint  Patient presents with  . Rash    rash on both lower leg. One has been there for a year but more has popped up in thew last few months  . Diabetes    6 month f/u on A1C was elevated on 12/30/18    HPI Theresa Rosario presents for abnormal rash on the legs-pt states right leg with pigmented lesion x 1 year-increasing in size and pigmentation. Pt now notes left upper leg with raised lesion with hypopigmentation. Pt with multiple lesions on the knees bilat and an additional pigmented spot on the left lateral leg. No lesions noted on other areas of the body. Lesions are itchy and pt has taken benadryl and Atarax for the itch. Pt has used otc topical cream with no improvement. H/o eczema, no psoriasis  A1c elevated previously-6.3%-pt admits to drinking soda-multiple/day-Mother with Diabetes  Past Medical History:  Diagnosis Date  . Blood in urine 10/18/2015  . Depression   . Fibromyalgia   . Hx of hematuria   . Hx of ovarian cyst   . Hypertension   . Kidney stone   . Large breasts   . Migraine     Past Surgical History:  Procedure Laterality Date  . DENTAL SURGERY    . WISDOM TOOTH EXTRACTION Bilateral     Family History  Problem Relation Age of Onset  . Diabetes Mother   . Hypertension Mother   . Parkinson's disease Mother   . Colon cancer Mother 88  . Hypertension Father   . Heart disease Maternal Grandfather   . Coronary artery disease Other   . Diabetes Other   . Diabetes Paternal Grandmother   . Hypertension Paternal Grandmother   . Diabetes Maternal Grandmother     Social History   Socioeconomic History  . Marital status: Significant Other    Spouse name: Not on file  . Number of children: 1  . Years of education: college  . Highest education level: Master's degree (e.g., MA, MS, MEng, MEd, MSW,  MBA)  Occupational History  . 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 Smoking status: Never Smoker  . Smokeless tobacco: Never Used  . Tobacco comment: occasional vape  Substance and Sexual Activity  . Alcohol use: Yes    Comment: socially  . Drug use: No  . Sexual activity: Yes    Birth control/protection: None  Other Topics Concern  . Not on file  Social History Narrative   Lives at home with boyfriend and daughter.   Right-handed.   2-3 cups caffeine per day.   Social Determinants of Health   Financial Resource Strain:   . Difficulty of Paying Living Expenses:   Food Insecurity:   . Worried About Programme researcher, broadcasting/film/video in the Last Year:   . Barista in the Last Year:   Transportation Needs:   . Freight forwarder (Medical):   Marland Kitchen Lack of Transportation (Non-Medical):   Physical Activity:   . Days of Exercise per Week:   . Minutes of Exercise per Session:   Stress:   . Feeling of Stress :   Social Connections:   . Frequency of Communication with Friends and Family:   . Frequency of Social Gatherings with Friends and Family:   . Attends Religious Services:   .  Active Member of Clubs or Organizations:   . Attends Archivist Meetings:   Marland Kitchen Marital Status:   Intimate Partner Violence:   . Fear of Current or Ex-Partner:   . Emotionally Abused:   Marland Kitchen Physically Abused:   . Sexually Abused:     Outpatient Medications Prior to Visit  Medication Sig Dispense Refill  . acetaminophen (TYLENOL) 500 MG tablet Take 500-1,000 mg by mouth every 8 (eight) hours as needed for mild pain.     Marland Kitchen amLODipine (NORVASC) 10 MG tablet Take 1 tablet (10 mg total) by mouth daily. 30 tablet 1  . clomiPHENE (CLOMID) 50 MG tablet Take one by mouth daily on day 1-5 of your cycle 5 tablet 3  . cyclobenzaprine (FLEXERIL) 10 MG tablet Take 1 tablet (10 mg total) by mouth at bedtime. 30 tablet 5  . DULoxetine (CYMBALTA) 30 MG capsule 30 mg in the am and 60  mg in the pm.    . hydrocortisone cream 1 % Apply 1 application topically daily as needed for itching.    . hydrOXYzine (ATARAX/VISTARIL) 25 MG tablet Take 25-50 mg by mouth at bedtime as needed.    Marland Kitchen ibuprofen (ADVIL) 200 MG tablet Take 400-800 mg by mouth daily as needed for mild pain or moderate pain.     Marland Kitchen LORazepam (ATIVAN) 0.5 MG tablet Take 0.5 mg by mouth every 8 (eight) hours as needed for anxiety.    Marland Kitchen MAGNESIUM OXIDE PO Take 1 tablet by mouth every 14 (fourteen) days.    . medroxyPROGESTERone (PROVERA) 10 MG tablet Take 1 tablet (10 mg total) by mouth daily. 10 tablet 0  . MELATONIN PO Take 1 tablet by mouth at bedtime as needed (for sleep).     . Menthol, Topical Analgesic, (BIOFREEZE EX) Apply 1 application topically daily as needed (for pain).    . pantoprazole (PROTONIX) 20 MG tablet Take 1 tablet (20 mg total) by mouth daily. 90 tablet 3  . Prenatal Vit-Fe Fumarate-FA (PREPLUS) 27-1 MG TABS Take 1 tablet by mouth daily. 30 tablet 12  . traMADol (ULTRAM) 50 MG tablet      No facility-administered medications prior to visit.    Allergies  Allergen Reactions  . Amoxicillin Hives  . Motrin [Ibuprofen] Rash    MOTRIN (Brand only) causes this allergy    ROS Review of Systems  Eyes: Negative for visual disturbance.  Respiratory: Negative.   Endocrine: Negative for polydipsia and polyuria.       DM  Skin: Positive for rash.      Objective:    Physical Exam  Constitutional: Theresa Rosario is oriented to person, place, and time. Theresa Rosario appears well-developed and well-nourished.  Cardiovascular: Normal rate, regular rhythm, normal heart sounds and intact distal pulses.  Pulmonary/Chest: Effort normal and breath sounds normal.  Neurological: Theresa Rosario is oriented to person, place, and time.  Skin:     Right leg-1.5cm pigmented lesion lower leg Left leg-2cm hypo pigmented macule with 49mm raised tan lesion Left lateral upper leg hyperpigmented lesion -2cm macule Hyperpigmented papules  medial knee bilat  Psychiatric: Theresa Rosario has a normal mood and affect. Her behavior is normal.    BP 123/81 (BP Location: Right Arm, Patient Position: Sitting, Cuff Size: Large)   Pulse 100   Temp 97.6 F (36.4 C) (Temporal)   Ht 5\' 1"  (1.549 m)   Wt 263 lb 9.6 oz (119.6 kg)   LMP 04/03/2019   SpO2 96%   BMI 49.81 kg/m  Wt Readings from  Last 3 Encounters:  07/07/19 263 lb 9.6 oz (119.6 kg)  06/30/19 263 lb (119.3 kg)  03/31/19 263 lb 6.4 oz (119.5 kg)     Lab Results  Component Value Date   TSH 1.950 06/30/2019   Lab Results  Component Value Date   WBC 11.7 (H) 02/26/2019   HGB 11.1 (L) 02/26/2019   HCT 38.1 02/26/2019   MCV 71.5 (L) 02/26/2019   PLT 378 02/26/2019   Lab Results  Component Value Date   NA 137 02/26/2019   K 3.5 02/26/2019   CO2 26 02/26/2019   GLUCOSE 86 02/26/2019   BUN 9 02/26/2019   CREATININE 0.68 02/26/2019   BILITOT 0.3 01/31/2019   ALKPHOS 96 10/23/2016   AST 16 01/31/2019   ALT 18 01/31/2019   PROT 6.9 01/31/2019   ALBUMIN 4.1 10/23/2016   CALCIUM 9.2 02/26/2019   ANIONGAP 10 02/26/2019   Lab Results  Component Value Date   HGBA1C 6.5 (A) 07/07/2019      Assessment & Plan:  1. Elevated glucose-DM A1c 6.5% - POCT glycosylated hemoglobin (Hb A1C) dw [pt concern for DM-stop sugary drinks-dietitian referral 2. Hyperpigmented skin lesion Derm referral-xyzal for itch Problem List Items Addressed This Visit      Other   Elevated glucose - Primary   Relevant Orders   POCT glycosylated hemoglobin (Hb A1C) (Completed)     Follow-up: derm   LISA Mat Carne, MD

## 2019-07-13 ENCOUNTER — Encounter: Payer: Self-pay | Admitting: Family Medicine

## 2019-07-19 ENCOUNTER — Other Ambulatory Visit: Payer: Self-pay | Admitting: Family Medicine

## 2019-07-19 ENCOUNTER — Encounter: Payer: Medicaid Other | Attending: Family Medicine | Admitting: Nutrition

## 2019-07-19 VITALS — Ht 61.0 in | Wt 262.8 lb

## 2019-07-19 DIAGNOSIS — E118 Type 2 diabetes mellitus with unspecified complications: Secondary | ICD-10-CM | POA: Diagnosis not present

## 2019-07-19 DIAGNOSIS — Z6841 Body Mass Index (BMI) 40.0 and over, adult: Secondary | ICD-10-CM | POA: Diagnosis not present

## 2019-07-19 DIAGNOSIS — I10 Essential (primary) hypertension: Secondary | ICD-10-CM | POA: Diagnosis not present

## 2019-07-19 DIAGNOSIS — R739 Hyperglycemia, unspecified: Secondary | ICD-10-CM

## 2019-07-19 MED ORDER — BLOOD GLUCOSE MONITORING SUPPL DEVI: 0 refills | Status: DC

## 2019-07-19 MED ORDER — GLUCOSE BLOOD VI STRP: ORAL_STRIP | 12 refills | Status: DC

## 2019-07-19 MED ORDER — ACCU-CHEK SOFTCLIX LANCETS MISC: 12 refills | Status: DC

## 2019-07-19 NOTE — Patient Instructions (Signed)
Goals Follow MY Plate Eat 3 meals a day at times discussed Cut out snacks, processed foods and fried foods. Increase fresh fruits and vegetables. Drink only water and cut out sodas, juice and sugar beverages. Lose 1-2 lbs per week

## 2019-07-19 NOTE — Progress Notes (Addendum)
  Medical Nutrition Therapy:  Appt start time: 1400 end time:  1430.   Assessment:  Primary concerns today: Obesity.  Type 2 DM now, LIves with boyfriend. And daughter. Cooks some. Changes made: Cut back on portions, drinking more water and cut down on sugar drinks. Has been at [facility] swimming  Twice a week. Trying to not eat after dinner.. A1C 6.5%. Needs a meter and supplies to start testing. Would benefit from Metformin daily.   Lab Results  Component Value Date   HGBA1C 6.5 (A) 07/07/2019   CMP Latest Ref Rng & Units 02/26/2019 01/31/2019 09/19/2018  Glucose 70 - 99 mg/dL 86 496(P) 591(M)  BUN 6 - 20 mg/dL 9 11 11   Creatinine 0.44 - 1.00 mg/dL 3.84 6.65  Sodium 135 - 145 mmol/L 137 137 138  Potassium 3.5 - 5.1 mmol/L 3.5 5.0 3.6  Chloride 98 - 111 mmol/L 101 102 101  CO2 22 - 32 mmol/L 26 27 26   Calcium 8.9 - 10.3 mg/dL 9.2 9.4 9.0  Total Protein 6.1 - 8.1 g/dL - 6.9 -  Total Bilirubin 0.2 - 1.2 mg/dL - 0.3 -  Alkaline Phos 39 - 117 IU/L - - -  AST 10 - 30 U/L - 16 -  ALT 6 - 29 U/L - 18 -    Preferred Learning Style:   No preference indicated   Learning Readiness:   Ready  Change in progress   MEDICATIONS:   DIETARY INTAKE:  24-hr recall:  B ( AM): Corn flakes  1 cup or honey nut cherrios, whole milk. 1 cup (skipped this am.) Snk ( AM):   L ( PM):  RB  sandwich from [employer] and fries, water Snk ( PM): D ( PM): 2 egg rolls, water Snk ( PM):  Beverages:water, bleu water Usual physical activity:  Swimming twice a week.   Estimated energy needs: 1200 calories 135 g carbohydrates 90 g protein 33 g fat  Progress Towards Goal(s):  In progress.   Nutritional Diagnosis:  NI-1.5 Excessive energy intake As related to High Calorie HIgh fat diet.  As evidenced by BMI > 30 and 20-30 lb weight gain.    Intervention:  Nutrition and  Pre-Diabetes education provided on My Plate, CHO counting, meal planning, portion sizes, timing of meals, avoiding snacks between  meals,  target ranges for A1C and blood sugars, signs/symptoms and treatment of hyper/hypoglycemia, monitoring blood sugars, taking medications as prescribed, benefits of exercising 60 minutes per day and prevention of  DM. 9.93 Goals Follow MY Plate Eat 3 meals a day at times discussed Cut out snacks, processed foods and fried foods. Increase fresh fruits and vegetables. Drink only water and cut out sodas, juice and sugar beverages. Lose 1-2 lbs per week   Teaching Method Utilized:  Visual Auditory Hands on  Handouts given during visit include:  The Plate Method   Meal Plan Card  Pre Dm instructions   Barriers to learning/adherence to lifestyle change: none  Demonstrated degree of understanding via:  Teach Back   Monitoring/Evaluation:  Dietary intake, exercise, and body weight in 1 month(s)  Recommend to start testing BS twice a day; am and before bed. Needs prescription for Metformin and testing supplies for the Accucheck Guide meter. 

## 2019-07-21 ENCOUNTER — Encounter: Payer: Self-pay | Admitting: Nutrition

## 2019-07-23 DIAGNOSIS — G4733 Obstructive sleep apnea (adult) (pediatric): Secondary | ICD-10-CM | POA: Diagnosis not present

## 2019-08-01 ENCOUNTER — Encounter: Payer: Self-pay | Admitting: Nutrition

## 2019-08-01 ENCOUNTER — Other Ambulatory Visit: Payer: Self-pay

## 2019-08-01 ENCOUNTER — Encounter: Payer: Medicaid Other | Attending: Family Medicine | Admitting: Nutrition

## 2019-08-01 VITALS — Ht 61.0 in | Wt 258.4 lb

## 2019-08-01 DIAGNOSIS — E118 Type 2 diabetes mellitus with unspecified complications: Secondary | ICD-10-CM | POA: Diagnosis not present

## 2019-08-01 DIAGNOSIS — I10 Essential (primary) hypertension: Secondary | ICD-10-CM | POA: Diagnosis not present

## 2019-08-01 DIAGNOSIS — E1165 Type 2 diabetes mellitus with hyperglycemia: Secondary | ICD-10-CM | POA: Insufficient documentation

## 2019-08-01 DIAGNOSIS — IMO0002 Reserved for concepts with insufficient information to code with codable children: Secondary | ICD-10-CM

## 2019-08-01 NOTE — Progress Notes (Signed)
  Medical Nutrition Therapy:  Appt start time: 0930 end time:  1000   Assessment:  Primary concerns today: Obesity.  Type 2 DM. A1C 6.5%. Not on any DM yet. PCP Dr. Judee Clara. Lost 5 lbs.  Working on portions and timing of meals. Has been trying zooles and cauliflower rice. Eating more lean meats.  Has cut out sweets, snacks and cut down on fast foods.  Testing blood sugars 108 to `37 mg/dl. 1`01 153 mg/dl. Exercise-doing water aerobics and walking.     Lab Results  Component Value Date   HGBA1C 6.5 (A) 07/07/2019   CMP Latest Ref Rng & Units 02/26/2019 01/31/2019 09/19/2018  Glucose 70 - 99 mg/dL 86 924(Q) 683(M)  BUN 6 - 20 mg/dL 9 11 11   Creatinine 0.44 - 1.00 mg/dL 1.96 2.22  Sodium 135 - 145 mmol/L 137 137 138  Potassium 3.5 - 5.1 mmol/L 3.5 5.0 3.6  Chloride 98 - 111 mmol/L 101 102 101  CO2 22 - 32 mmol/L 26 27 26   Calcium 8.9 - 10.3 mg/dL 9.2 9.4 9.0  Total Protein 6.1 - 8.1 g/dL - 6.9 -  Total Bilirubin 0.2 - 1.2 mg/dL - 0.3 -  Alkaline Phos 39 - 117 IU/L - - -  AST 10 - 30 U/L - 16 -  ALT 6 - 29 U/L - 18 -    Preferred Learning Style:   No preference indicated   Learning Readiness:   Ready  Change in progress   MEDICATIONS:   DIETARY INTAKE:  24-hr recall:  B ( AM): Oatmeal and banana or applesauce or egg and wheat bread.  this am.) Snk ( AM):   L ( PM):  9.79 sandwich  And water, fruit Snk ( PM): D ( PM): Shrimp, mushrooms, stirfry cabbage, water  Snk ( PM):  Beverages:water, bleu water Usual physical activity:  Swimming twice a week.   Estimated energy needs: 1200 calories 135 g carbohydrates 90 g protein 33 g fat  Progress Towards Goal(s):  In progress.   Nutritional Diagnosis:  NI-1.5 Excessive energy intake As related to High Calorie HIgh fat diet.  As evidenced by BMI > 30 and 20-30 lb weight gain.    Intervention:  Nutrition and  Pre-Diabetes education provided on My Plate, CHO counting, meal planning, portion sizes, timing of  meals, avoiding snacks between meals,  target ranges for A1C and blood sugars, signs/symptoms and treatment of hyper/hypoglycemia, monitoring blood sugars, taking medications as prescribed, benefits of exercising 60 minutes per day and prevention of  DM.  Goals  Lose 5 lbs a month. Increase exercise to 4 times per week Find a new PCP. Test blood sugar a few times a week before breakfast and before bed.  Teaching Method Utilized:  Visual Auditory Hands on  Handouts given during visit include:  The Plate Method   Meal Plan Card  Pre Dm instructions   Barriers to learning/adherence to lifestyle change: none  Demonstrated degree of understanding via:  Teach Back   Monitoring/Evaluation:  Dietary intake, exercise, and body weight in 1 month(s)  Would benefit from Metformin 500 mg BID. 

## 2019-08-01 NOTE — Patient Instructions (Signed)
Goals  Lose 5 lbs a month. Increase exercise to 4 times per week Find a new PCP. Test blood sugar a few times a week before breakfast and before bed.

## 2019-08-03 DIAGNOSIS — G4733 Obstructive sleep apnea (adult) (pediatric): Secondary | ICD-10-CM | POA: Diagnosis not present

## 2019-08-03 DIAGNOSIS — M542 Cervicalgia: Secondary | ICD-10-CM | POA: Diagnosis not present

## 2019-08-03 DIAGNOSIS — M797 Fibromyalgia: Secondary | ICD-10-CM | POA: Diagnosis not present

## 2019-08-03 DIAGNOSIS — M545 Low back pain: Secondary | ICD-10-CM | POA: Diagnosis not present

## 2019-08-04 ENCOUNTER — Other Ambulatory Visit: Payer: Self-pay | Admitting: Adult Health

## 2019-08-04 MED ORDER — FLUCONAZOLE 150 MG PO TABS: ORAL_TABLET | ORAL | 1 refills | Status: DC

## 2019-08-04 NOTE — Progress Notes (Signed)
rx diflucan  

## 2019-09-13 DIAGNOSIS — M797 Fibromyalgia: Secondary | ICD-10-CM | POA: Diagnosis not present

## 2019-09-13 DIAGNOSIS — M542 Cervicalgia: Secondary | ICD-10-CM | POA: Diagnosis not present

## 2019-09-13 DIAGNOSIS — G4733 Obstructive sleep apnea (adult) (pediatric): Secondary | ICD-10-CM | POA: Diagnosis not present

## 2019-09-13 DIAGNOSIS — M545 Low back pain: Secondary | ICD-10-CM | POA: Diagnosis not present

## 2019-09-18 ENCOUNTER — Other Ambulatory Visit: Payer: Self-pay | Admitting: Family Medicine

## 2019-09-18 DIAGNOSIS — I10 Essential (primary) hypertension: Secondary | ICD-10-CM

## 2019-09-25 ENCOUNTER — Other Ambulatory Visit: Payer: Self-pay | Admitting: Family Medicine

## 2019-11-02 ENCOUNTER — Ambulatory Visit: Payer: Medicaid Other | Admitting: Nutrition

## 2019-11-23 DIAGNOSIS — E119 Type 2 diabetes mellitus without complications: Secondary | ICD-10-CM | POA: Diagnosis not present

## 2019-12-23 ENCOUNTER — Ambulatory Visit (INDEPENDENT_AMBULATORY_CARE_PROVIDER_SITE_OTHER): Payer: Medicaid Other | Admitting: *Deleted

## 2019-12-23 ENCOUNTER — Other Ambulatory Visit: Payer: Self-pay

## 2019-12-23 ENCOUNTER — Other Ambulatory Visit: Payer: Self-pay | Admitting: Women's Health

## 2019-12-23 VITALS — BP 132/80 | HR 80

## 2019-12-23 DIAGNOSIS — N926 Irregular menstruation, unspecified: Secondary | ICD-10-CM

## 2019-12-23 LAB — POCT URINE PREGNANCY: Preg Test, Ur: POSITIVE — AB

## 2019-12-23 MED ORDER — CITRANATAL ASSURE 35-1 & 300 MG PO MISC
ORAL | 11 refills | Status: DC
Start: 2019-12-23 — End: 2020-02-14

## 2019-12-23 NOTE — Progress Notes (Addendum)
   NURSE VISIT- PREGNANCY CONFIRMATION   SUBJECTIVE:  Theresa Rosario is a 34 y.o. G43P1011 female at [redacted]w[redacted]d by certain LMP of Patient's last menstrual period was 10/22/2019 (exact date). Here for pregnancy confirmation.  Home pregnancy test: positive x 1  She reports no complaints.  She is not taking prenatal vitamins.    OBJECTIVE:  BP 132/80 (BP Location: Right Arm, Patient Position: Sitting, Cuff Size: Normal)   Pulse 80   LMP 10/22/2019 (Exact Date)   Appears well, in no apparent distress OB History  Gravida Para Term Preterm AB Living  3 1 1   1 1   SAB TAB Ectopic Multiple Live Births          1    # Outcome Date GA Lbr Len/2nd Weight Sex Delivery Anes PTL Lv  3 Current           2 Term 10/29/12 [redacted]w[redacted]d 14:45 / 00:42 6 lb 2.4 oz (2.79 kg) F Vag-Spont EPI  LIV  1 AB             Results for orders placed or performed in visit on 12/23/19 (from the past 24 hour(s))  POCT urine pregnancy   Collection Time: 12/23/19  9:27 AM  Result Value Ref Range   Preg Test, Ur Positive (A) Negative    ASSESSMENT: Positive pregnancy test, [redacted]w[redacted]d by LMP    PLAN: Schedule for dating ultrasound in next available  Prenatal vitamins: note routed to [redacted]w[redacted]d, CNM to send prescription   Nausea medicines: not currently needed   OB packet given: Yes  [name]  12/23/2019   Chart reviewed for nurse visit. Agree with plan of care. rx citranatal assure. 02/22/2020, Cheral Marker 12/23/2019 5:57 PM   9:41 AM

## 2020-01-03 DIAGNOSIS — M25559 Pain in unspecified hip: Secondary | ICD-10-CM | POA: Diagnosis not present

## 2020-01-03 DIAGNOSIS — M542 Cervicalgia: Secondary | ICD-10-CM | POA: Diagnosis not present

## 2020-01-03 DIAGNOSIS — G4733 Obstructive sleep apnea (adult) (pediatric): Secondary | ICD-10-CM | POA: Diagnosis not present

## 2020-01-03 DIAGNOSIS — M797 Fibromyalgia: Secondary | ICD-10-CM | POA: Diagnosis not present

## 2020-01-03 DIAGNOSIS — M545 Low back pain, unspecified: Secondary | ICD-10-CM | POA: Diagnosis not present

## 2020-01-04 ENCOUNTER — Encounter: Payer: Self-pay | Admitting: Internal Medicine

## 2020-01-04 ENCOUNTER — Other Ambulatory Visit: Payer: Self-pay

## 2020-01-04 ENCOUNTER — Ambulatory Visit (INDEPENDENT_AMBULATORY_CARE_PROVIDER_SITE_OTHER): Payer: Medicaid Other | Admitting: Internal Medicine

## 2020-01-04 VITALS — BP 126/80 | HR 74 | Temp 97.8°F | Resp 18 | Ht 61.0 in | Wt 251.0 lb

## 2020-01-04 DIAGNOSIS — G4700 Insomnia, unspecified: Secondary | ICD-10-CM

## 2020-01-04 DIAGNOSIS — R7303 Prediabetes: Secondary | ICD-10-CM

## 2020-01-04 DIAGNOSIS — Z3A10 10 weeks gestation of pregnancy: Secondary | ICD-10-CM

## 2020-01-04 DIAGNOSIS — M797 Fibromyalgia: Secondary | ICD-10-CM | POA: Diagnosis not present

## 2020-01-04 DIAGNOSIS — Z3490 Encounter for supervision of normal pregnancy, unspecified, unspecified trimester: Secondary | ICD-10-CM | POA: Insufficient documentation

## 2020-01-04 DIAGNOSIS — Z7689 Persons encountering health services in other specified circumstances: Secondary | ICD-10-CM | POA: Diagnosis not present

## 2020-01-04 DIAGNOSIS — I10 Essential (primary) hypertension: Secondary | ICD-10-CM | POA: Diagnosis not present

## 2020-01-04 LAB — POCT HEMOGLOBIN: Hemoglobin: 5.7 g/dL — AB (ref 11–14.6)

## 2020-01-04 MED ORDER — LABETALOL HCL 100 MG PO TABS: 200.0000 mg | ORAL_TABLET | Freq: Two times a day (BID) | ORAL | 5 refills | Status: DC

## 2020-01-04 NOTE — Assessment & Plan Note (Signed)
On Cymbalta and Gabapentin, tapering them according to the Neurologist recommendation Takes Flexeril PRN

## 2020-01-04 NOTE — Assessment & Plan Note (Addendum)
[redacted] weeks gestation Has Ob./Gyn. appointment in the next week Continue prenatal vitamins

## 2020-01-04 NOTE — Assessment & Plan Note (Signed)
Takes Benadryl PRN and Melatonin Advised behavioral modification for better sleep hygiene

## 2020-01-04 NOTE — Patient Instructions (Addendum)
Please follow up with Ob./Gyn. as scheduled for antenatal care.  Please continue to check blood glucose levels at least once in a day at home. If blood glucose is less than 60 or more than 200, please get medical attention.  Please stop taking Amlodipine and start taking Labetalol 100 mg twice daily. Please continue to perform mild exercise/walking at least 150 mins/week.  Please continue to take prenatal vitamins everyday.

## 2020-01-04 NOTE — Assessment & Plan Note (Signed)
Care established Previous chart reviewed History and medications reviewed with the patient 

## 2020-01-04 NOTE — Assessment & Plan Note (Signed)
Likely related to obesity Diet controlled, advised to continue diabetic diet HbA1C: 5.7 in the office today

## 2020-01-04 NOTE — Progress Notes (Signed)
New Patient Office Visit  Subjective:  Patient ID: Theresa Rosario, female    DOB: Mar 26, 1985  Age: 34 y.o. MRN: [id number]  CC:  Chief Complaint  Patient presents with  . New Patient (Initial Visit)    HPI Theresa Rosario is a 34 year old old female with past medical history of hypertension, prediabetes, fibromyalgia, insomnia and morbid obesity presents for establishing care.  Patient is currently [redacted] weeks pregnant, and has a scheduled appointment [facility] in the next week.  Patient takes amlodipine for hypertension currently.  Her BP in the office is 126/80 today.  She denies any headache, dizziness, chest pain, dyspnea, or palpitations.  Patient follows up with neurology for fibromyalgia.  She has been titrating down Cymbalta and gabapentin according to her neurologist recommendation as she is pregnant.  She also takes Flexeril for muscle spasms.  Patient has a history of prediabetes, which is diet controlled.  Her HbA1c in the office today is 5.7.  She denies polyuria or polyphagia.  Patient has had both doses of Covid vaccination.  Past Medical History:  Diagnosis Date  . Anemia 12/30/2018  . Blood in urine 10/18/2015  . Condyloma of female genitalia 10/21/2012   Subclitoral, TCA 7/31   . Depression   . Fibromyalgia   . Hx of hematuria   . Hx of ovarian cyst   . Hypertension   . Irregular bleeding 12/02/2017  . Kidney stone   . Large breasts   . Lumbago 11/11/2011  . Migraine     Past Surgical History:  Procedure Laterality Date  . DENTAL SURGERY    . WISDOM TOOTH EXTRACTION Bilateral     Family History  Problem Relation Age of Onset  . Diabetes Mother   . Hypertension Mother   . Parkinson's disease Mother   . Colon cancer Mother 75  . Hypertension Father   . Heart disease Maternal Grandfather   . Coronary artery disease Other   . Diabetes Other   . Diabetes Paternal Grandmother   . Hypertension Paternal Grandmother   . Diabetes  Maternal Grandmother     Social History   Socioeconomic History  . Marital status: Significant Other    Spouse name: Not on file  . Number of children: 1  . Years of education: college  . Highest education level: Master's degree (e.g., MA, MS, MEng, MEd, MSW, MBA)  Occupational History  . 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 Smoking status: Former Smoker    Types: Cigarettes    Quit date: 09/22/2019    Years since quitting: 0.2  . Smokeless tobacco: Never Used  . Tobacco comment: occasional vape  Vaping Use  . Vaping Use: Former  Substance and Sexual Activity  . Alcohol use: Yes    Comment: socially  . Drug use: No  . Sexual activity: Yes    Birth control/protection: None  Other Topics Concern  . Not on file  Social History Narrative   Lives at home with boyfriend and daughter.   Right-handed.   2-3 cups caffeine per day.   Social Determinants of Health   Financial Resource Strain:   . Difficulty of Paying Living Expenses: Not on file  Food Insecurity:   . Worried About Programme researcher, broadcasting/film/video in the Last Year: Not on file  . Ran Out of Food in the Last Year: Not on file  Transportation Needs:   . Lack of Transportation (Medical): Not on file  .  Lack of Transportation (Non-Medical): Not on file  Physical Activity:   . Days of Exercise per Week: Not on file  . Minutes of Exercise per Session: Not on file  Stress:   . Feeling of Stress : Not on file  Social Connections:   . Frequency of Communication with Friends and Family: Not on file  . Frequency of Social Gatherings with Friends and Family: Not on file  . Attends Religious Services: Not on file  . Active Member of Clubs or Organizations: Not on file  . Attends Banker Meetings: Not on file  . Marital Status: Not on file  Intimate Partner Violence:   . Fear of Current or Ex-Partner: Not on file  . Emotionally Abused: Not on file  . Physically Abused: Not on file  .  Sexually Abused: Not on file    ROS Review of Systems  Constitutional: Negative for chills and fever.  HENT: Negative for congestion, sinus pressure, sinus pain and sore throat.   Eyes: Negative for pain and discharge.  Respiratory: Negative for cough and shortness of breath.   Cardiovascular: Negative for chest pain and palpitations.  Gastrointestinal: Negative for abdominal pain, constipation, diarrhea, nausea and vomiting.  Endocrine: Negative for polydipsia and polyuria.  Genitourinary: Negative for dysuria and hematuria.  Musculoskeletal: Negative for neck pain and neck stiffness.  Skin: Negative for rash.  Neurological: Negative for dizziness, syncope, weakness and numbness.  Psychiatric/Behavioral: Negative for agitation and behavioral problems.    Objective:   Today's Vitals: BP 126/80   Pulse 74   Temp 97.8 F (36.6 C)   Resp 18   Ht 5\' 1"  (1.549 m)   Wt 251 lb (113.9 kg)   LMP 10/22/2019 (Exact Date)   SpO2 97%   BMI 47.43 kg/m   Physical Exam Vitals reviewed.  Constitutional:      General: She is not in acute distress.    Appearance: She is obese. She is not diaphoretic.  HENT:     Head: Normocephalic and atraumatic.     Nose: Nose normal.     Mouth/Throat:     Mouth: Mucous membranes are moist.  Eyes:     General: No scleral icterus.    Extraocular Movements: Extraocular movements intact.     Pupils: Pupils are equal, round, and reactive to light.  Cardiovascular:     Rate and Rhythm: Normal rate and regular rhythm.     Pulses: Normal pulses.     Heart sounds: Normal heart sounds. No murmur heard.   Pulmonary:     Breath sounds: Normal breath sounds. No wheezing or rales.  Abdominal:     Palpations: Abdomen is soft.     Tenderness: There is no abdominal tenderness.  Musculoskeletal:     Cervical back: Neck supple. No tenderness.     Right lower leg: No edema.     Left lower leg: No edema.  Skin:    General: Skin is warm.     Findings: No  rash.  Neurological:     General: No focal deficit present.     Mental Status: She is alert and oriented to person, place, and time.     Sensory: No sensory deficit.     Motor: No weakness.  Psychiatric:        Mood and Affect: Mood normal.        Behavior: Behavior normal.     Assessment & Plan:   Encounter to establish care Care established Previous chart reviewed  History and medications reviewed with the patient  Hypertension Well-controlled with Amlodipine Switched to Labetalol due to current pregnant status Advised to continue low-salt diet and exercise/walking, at least 150 mins/week  Pregnancy [redacted] weeks gestation Has Ob./Gyn. appointment in the next week Continue prenatal vitamins  Prediabetes Likely related to obesity Diet controlled, advised to continue diabetic diet HbA1C: 5.7 in the office today   Fibromyalgia On Cymbalta and Gabapentin, tapering them according to the Neurologist recommendation Takes Flexeril PRN  Insomnia Takes Benadryl PRN and Melatonin Advised behavioral modification for better sleep hygiene  [facility] Will try to obtain records.  Outpatient Encounter Medications as of 01/04/2020  Medication Sig  . Accu-Chek Softclix Lancets lancets Use as instructed  . acetaminophen (TYLENOL) 500 MG tablet Take 500-1,000 mg by mouth every 8 (eight) hours as needed for mild pain.   Marland Kitchen amLODipine (NORVASC) 10 MG tablet Take 1 tablet (10 mg total) by mouth daily.  . Blood Glucose Monitoring Suppl DEVI Use to check glucose daily  . cyclobenzaprine (FLEXERIL) 10 MG tablet Take 1 tablet (10 mg total) by mouth at bedtime.  . diphenhydrAMINE (BENADRYL) 25 MG tablet Take 25 mg by mouth every 6 (six) hours as needed.  Marland Kitchen glucose blood test strip Use as instructed  . MELATONIN PO Take 1 tablet by mouth at bedtime as needed (for sleep).   . Menthol, Topical Analgesic, (BIOFREEZE EX) Apply 1 application  topically daily as needed (for pain).  . pantoprazole (PROTONIX) 20 MG tablet Take 1 tablet (20 mg total) by mouth daily.  . Prenat w/o A-[id number] (CITRANATAL ASSURE) 35-1 & 300 MG tablet One tablet and one capsule daily  . DULoxetine (CYMBALTA) 30 MG capsule 30 mg in the am and 60 mg in the pm. (Patient not taking: Reported on 01/04/2020)  . gabapentin (NEURONTIN) 300 MG capsule gabapentin 300 mg capsule  TAKE 1 CAPSULE BY MOUTH IN THE MORNING AND 2 CAPSULES AT BEDTIME (Patient not taking: Reported on 01/04/2020)  . hydrOXYzine (ATARAX/VISTARIL) 25 MG tablet Take 25-50 mg by mouth at bedtime as needed. (Patient not taking: Reported on 12/23/2019)  . labetalol (NORMODYNE) 100 MG tablet Take 2 tablets (200 mg total) by mouth 2 (two) times daily.  . [DISCONTINUED] propranolol ER (INDERAL LA) 60 MG 24 hr capsule Take 1 capsule (60 mg total) by mouth daily.  . [DISCONTINUED] zonisamide (ZONEGRAN) 100 MG capsule Take 2 capsules (200 mg total) by mouth at bedtime. (Patient not taking: Reported on 08/27/2018)   No facility-administered encounter medications on file as of 01/04/2020.    Follow-up: Return in about 6 months (around 07/04/2020).   [name], MD

## 2020-01-04 NOTE — Assessment & Plan Note (Signed)
Well-controlled with Amlodipine Switched to Labetalol due to current pregnant status Advised to continue low-salt diet and exercise/walking, at least 150 mins/week

## 2020-01-09 ENCOUNTER — Other Ambulatory Visit: Payer: Self-pay | Admitting: Obstetrics & Gynecology

## 2020-01-09 DIAGNOSIS — O3680X0 Pregnancy with inconclusive fetal viability, not applicable or unspecified: Secondary | ICD-10-CM

## 2020-01-10 ENCOUNTER — Ambulatory Visit (INDEPENDENT_AMBULATORY_CARE_PROVIDER_SITE_OTHER): Payer: Medicaid Other

## 2020-01-10 ENCOUNTER — Other Ambulatory Visit: Payer: Self-pay

## 2020-01-10 DIAGNOSIS — O3680X0 Pregnancy with inconclusive fetal viability, not applicable or unspecified: Secondary | ICD-10-CM | POA: Diagnosis not present

## 2020-01-10 DIAGNOSIS — Z3A11 11 weeks gestation of pregnancy: Secondary | ICD-10-CM | POA: Diagnosis not present

## 2020-01-10 NOTE — Progress Notes (Signed)
[name] 10 wks,single IUP with YS,positive fht 177 bpm,posterior placental,normal ovaries,crl 31.81 mm

## 2020-01-23 ENCOUNTER — Other Ambulatory Visit: Payer: Self-pay | Admitting: Obstetrics & Gynecology

## 2020-01-23 DIAGNOSIS — Z3682 Encounter for antenatal screening for nuchal translucency: Secondary | ICD-10-CM

## 2020-01-24 ENCOUNTER — Ambulatory Visit (INDEPENDENT_AMBULATORY_CARE_PROVIDER_SITE_OTHER): Payer: Medicaid Other

## 2020-01-24 ENCOUNTER — Ambulatory Visit (INDEPENDENT_AMBULATORY_CARE_PROVIDER_SITE_OTHER): Payer: Medicaid Other | Admitting: Women's Health

## 2020-01-24 ENCOUNTER — Encounter: Payer: Self-pay | Admitting: Women's Health

## 2020-01-24 VITALS — BP 104/66 | HR 93 | Wt 248.8 lb

## 2020-01-24 DIAGNOSIS — Z3A12 12 weeks gestation of pregnancy: Secondary | ICD-10-CM | POA: Diagnosis not present

## 2020-01-24 DIAGNOSIS — E119 Type 2 diabetes mellitus without complications: Secondary | ICD-10-CM | POA: Diagnosis not present

## 2020-01-24 DIAGNOSIS — Z3682 Encounter for antenatal screening for nuchal translucency: Secondary | ICD-10-CM | POA: Diagnosis not present

## 2020-01-24 DIAGNOSIS — Z1389 Encounter for screening for other disorder: Secondary | ICD-10-CM

## 2020-01-24 DIAGNOSIS — Z331 Pregnant state, incidental: Secondary | ICD-10-CM

## 2020-01-24 DIAGNOSIS — O0991 Supervision of high risk pregnancy, unspecified, first trimester: Secondary | ICD-10-CM | POA: Diagnosis not present

## 2020-01-24 DIAGNOSIS — Z31430 Encounter of female for testing for genetic disease carrier status for procreative management: Secondary | ICD-10-CM | POA: Diagnosis not present

## 2020-01-24 DIAGNOSIS — F418 Other specified anxiety disorders: Secondary | ICD-10-CM

## 2020-01-24 DIAGNOSIS — Z3400 Encounter for supervision of normal first pregnancy, unspecified trimester: Secondary | ICD-10-CM

## 2020-01-24 DIAGNOSIS — Z3481 Encounter for supervision of other normal pregnancy, first trimester: Secondary | ICD-10-CM | POA: Diagnosis not present

## 2020-01-24 DIAGNOSIS — O0990 Supervision of high risk pregnancy, unspecified, unspecified trimester: Secondary | ICD-10-CM | POA: Insufficient documentation

## 2020-01-24 DIAGNOSIS — I10 Essential (primary) hypertension: Secondary | ICD-10-CM

## 2020-01-24 LAB — POCT URINALYSIS DIPSTICK OB
Glucose, UA: NEGATIVE
Ketones, UA: NEGATIVE
Leukocytes, UA: NEGATIVE
Nitrite, UA: NEGATIVE
POC,PROTEIN,UA: NEGATIVE

## 2020-01-24 MED ORDER — ASPIRIN 81 MG PO TBEC: 162.0000 mg | DELAYED_RELEASE_TABLET | Freq: Every day | ORAL | 12 refills | Status: DC

## 2020-01-24 MED ORDER — ACCU-CHEK SOFTCLIX LANCETS MISC: 12 refills | Status: DC

## 2020-01-24 MED ORDER — BLOOD PRESSURE MONITOR MISC: 0 refills | Status: DC

## 2020-01-24 MED ORDER — GLUCOSE BLOOD VI STRP: ORAL_STRIP | 12 refills | Status: DC

## 2020-01-24 NOTE — Progress Notes (Signed)
[name] 12 wks,measurements c/w dates,crl 50.14 mm,fhr 174 bpm,posterior placenta,normal ovaries,NB present,NT

## 2020-01-24 NOTE — Patient Instructions (Addendum)
Theresa Rosario, I greatly value your feedback.  If you receive a survey following your visit with [name] today, we appreciate you taking the time to fill it out.  Thanks, [name], CNM, WHNP-BC   [facility] ([address] [place], [place] [address]) Entrance C, located off of [address] valet parking   Begin taking 162mg  (two 81mg  tablets) low-dose/baby aspirin daily to decrease the risk of preeclampsia during pregnancy. This prescription has been sent to your pharmacy, or you can pick it up over the counter.   Check blood sugars 4 times a day: in the morning before eating/drinking anything (<95) and 2 hours after your first bite of breakfast, lunch, and supper (<120).      Nausea & Vomiting  Have saltine crackers or pretzels by your bed and eat a few bites before you raise your head out of bed in the morning  Eat small frequent meals throughout the day instead of large meals  Drink plenty of fluids throughout the day to stay hydrated, just don't drink a lot of fluids with your meals.  This can make your stomach fill up faster making you feel sick  Do not brush your teeth right after you eat  Products with real ginger are good for nausea, like ginger ale and ginger hard candy Make sure it says made with real ginger!  Sucking on sour candy like lemon heads is also good for nausea  If your prenatal vitamins make you nauseated, take them at night so you will sleep through the nausea  Sea Bands  If you feel like you need medicine for the nausea & vomiting please let know  If you are unable to keep any fluids or food down please let know   Constipation  Drink plenty of fluid, preferably water, throughout the day  Eat foods high in fiber such as fruits, vegetables, and grains  Exercise, such as walking, is a good way to keep your bowels regular  Drink warm fluids, especially warm prune juice, or decaf coffee  Eat a 1/2 cup of  real oatmeal (not instant), 1/2 cup applesauce, and 1/2-1 cup warm prune juice every day  If needed, you may take Colace (docusate sodium) stool softener once or twice a day to help keep the stool soft.   If you still are having problems with constipation, you may take Miralax once daily as needed to help keep your bowels regular.   Home Blood Pressure Monitoring for Patients   Your provider has recommended that you check your blood pressure (BP) at least once a week at home. If you do not have a blood pressure cuff at home, one will be provided for you. Contact your provider if you have not received your monitor within 1 week.   Helpful Tips for Accurate Home Blood Pressure Checks  . Don't smoke, exercise, or drink caffeine 30 minutes before checking your BP . Use the restroom before checking your BP (a full bladder can raise your pressure) . Relax in a comfortable upright chair . Feet on the ground . Left arm resting comfortably on a flat surface at the level of your heart . Legs uncrossed . Back supported . Sit quietly and don't talk . Place the cuff on your bare arm . Adjust snuggly, so that only two fingertips can fit between your skin and the top of the cuff . Check 2 readings separated by at least one  minute . Keep a log of your BP readings . For a visual, please reference this diagram: http://ccnc.care/bpdiagram  Provider Name: [facility]     Phone: [phone]  Zone 1: ALL CLEAR  Continue to monitor your symptoms:  . BP reading is less than 140 (top number) or less than 90 (bottom number)  . No right upper stomach pain . No headaches or seeing spots . No feeling nauseated or throwing up . No swelling in face and hands  Zone 2: CAUTION Call your doctor's office for any of the following:  . BP reading is greater than 140 (top number) or greater than 90 (bottom number)  . Stomach pain under your ribs in the middle or right side . Headaches or seeing  spots . Feeling nauseated or throwing up . Swelling in face and hands  Zone 3: EMERGENCY  Seek immediate medical care if you have any of the following:  . BP reading is greater than160 (top number) or greater than 110 (bottom number) . Severe headaches not improving with Tylenol . Serious difficulty catching your breath . Any worsening symptoms from Zone 2    First Trimester of Pregnancy The first trimester of pregnancy is from week 1 until the end of week 12 (months 1 through 3). A week after a sperm fertilizes an egg, the egg will implant on the wall of the uterus. This embryo will begin to develop into a baby. Genes from you and your partner are forming the baby. The female genes determine whether the baby is a boy or a girl. At 6-8 weeks, the eyes and face are formed, and the heartbeat can be seen on ultrasound. At the end of 12 weeks, all the baby's organs are formed.  Now that you are pregnant, you will want to do everything you can to have a healthy baby. Two of the most important things are to get good prenatal care and to follow your health care provider's instructions. Prenatal care is all the medical care you receive before the baby's birth. This care will help prevent, find, and treat any problems during the pregnancy and childbirth. BODY CHANGES Your body goes through many changes during pregnancy. The changes vary from woman to woman.   You may gain or lose a couple of pounds at first.  You may feel sick to your stomach (nauseous) and throw up (vomit). If the vomiting is uncontrollable, call your health care provider.  You may tire easily.  You may develop headaches that can be relieved by medicines approved by your health care provider.  You may urinate more often. Painful urination may mean you have a bladder infection.  You may develop heartburn as a result of your pregnancy.  You may develop constipation because certain hormones are causing the muscles that push waste  through your intestines to slow down.  You may develop hemorrhoids or swollen, bulging veins (varicose veins).  Your breasts may begin to grow larger and become tender. Your nipples may stick out more, and the tissue that surrounds them (areola) may become darker.  Your gums may bleed and may be sensitive to brushing and flossing.  Dark spots or blotches (chloasma, mask of pregnancy) may develop on your face. This will likely fade after the baby is born.  Your menstrual periods will stop.  You may have a loss of appetite.  You may develop cravings for certain kinds of food.  You may have changes in your emotions from day to day,  such as being excited to be pregnant or being concerned that something may go wrong with the pregnancy and baby.  You may have more vivid and strange dreams.  You may have changes in your hair. These can include thickening of your hair, rapid growth, and changes in texture. Some women also have hair loss during or after pregnancy, or hair that feels dry or thin. Your hair will most likely return to normal after your baby is born. WHAT TO EXPECT AT YOUR PRENATAL VISITS During a routine prenatal visit:  You will be weighed to make sure you and the baby are growing normally.  Your blood pressure will be taken.  Your abdomen will be measured to track your baby's growth.  The fetal heartbeat will be listened to starting around week 10 or 12 of your pregnancy.  Test results from any previous visits will be discussed. Your health care provider may ask you:  How you are feeling.  If you are feeling the baby move.  If you have had any abnormal symptoms, such as leaking fluid, bleeding, severe headaches, or abdominal cramping.  If you have any questions. Other tests that may be performed during your first trimester include:  Blood tests to find your blood type and to check for the presence of any previous infections. They will also be used to check for low  iron levels (anemia) and Rh antibodies. Later in the pregnancy, blood tests for diabetes will be done along with other tests if problems develop.  Urine tests to check for infections, diabetes, or protein in the urine.  An ultrasound to confirm the proper growth and development of the baby.  An amniocentesis to check for possible genetic problems.  Fetal screens for spina bifida and Down syndrome.  You may need other tests to make sure you and the baby are doing well. HOME CARE INSTRUCTIONS  Medicines  Follow your health care provider's instructions regarding medicine use. Specific medicines may be either safe or unsafe to take during pregnancy.  Take your prenatal vitamins as directed.  If you develop constipation, try taking a stool softener if your health care provider approves. Diet  Eat regular, well-balanced meals. Choose a variety of foods, such as meat or vegetable-based protein, fish, milk and low-fat dairy products, vegetables, fruits, and whole grain breads and cereals. Your health care provider will help you determine the amount of weight gain that is right for you.  Avoid raw meat and uncooked cheese. These carry germs that can cause birth defects in the baby.  Eating four or five small meals rather than three large meals a day may help relieve nausea and vomiting. If you start to feel nauseous, eating a few soda crackers can be helpful. Drinking liquids between meals instead of during meals also seems to help nausea and vomiting.  If you develop constipation, eat more high-fiber foods, such as fresh vegetables or fruit and whole grains. Drink enough fluids to keep your urine clear or pale yellow. Activity and Exercise  Exercise only as directed by your health care provider. Exercising will help you:  Control your weight.  Stay in shape.  Be prepared for labor and delivery.  Experiencing pain or cramping in the lower abdomen or low back is a good sign that you  should stop exercising. Check with your health care provider before continuing normal exercises.  Try to avoid standing for long periods of time. Move your legs often if you must stand in one place for  a long time.  Avoid heavy lifting.  Wear low-heeled shoes, and practice good posture.  You may continue to have sex unless your health care provider directs you otherwise. Relief of Pain or Discomfort  Wear a good support bra for breast tenderness.    Take warm sitz baths to soothe any pain or discomfort caused by hemorrhoids. Use hemorrhoid cream if your health care provider approves.    Rest with your legs elevated if you have leg cramps or low back pain.  If you develop varicose veins in your legs, wear support hose. Elevate your feet for 15 minutes, 3-4 times a day. Limit salt in your diet. Prenatal Care  Schedule your prenatal visits by the twelfth week of pregnancy. They are usually scheduled monthly at first, then more often in the last 2 months before delivery.  Write down your questions. Take them to your prenatal visits.  Keep all your prenatal visits as directed by your health care provider. Safety  Wear your seat belt at all times when driving.  Make a list of emergency phone numbers, including numbers for family, friends, the hospital, and police and fire departments. General Tips  Ask your health care provider for a referral to a local prenatal education class. Begin classes no later than at the beginning of month 6 of your pregnancy.  Ask for help if you have counseling or nutritional needs during pregnancy. Your health care provider can offer advice or refer you to specialists for help with various needs.  Do not use hot tubs, steam rooms, or saunas.  Do not douche or use tampons or scented sanitary pads.  Do not cross your legs for long periods of time.  Avoid cat litter boxes and soil used by cats. These carry germs that can cause birth defects in the baby  and possibly loss of the fetus by miscarriage or stillbirth.  Avoid all smoking, herbs, alcohol, and medicines not prescribed by your health care provider. Chemicals in these affect the formation and growth of the baby.  Schedule a dentist appointment. At home, brush your teeth with a soft toothbrush and be gentle when you floss. SEEK MEDICAL CARE IF:   You have dizziness.  You have mild pelvic cramps, pelvic pressure, or nagging pain in the abdominal area.  You have persistent nausea, vomiting, or diarrhea.  You have a bad smelling vaginal discharge.  You have pain with urination.  You notice increased swelling in your face, hands, legs, or ankles. SEEK IMMEDIATE MEDICAL CARE IF:   You have a fever.  You are leaking fluid from your vagina.  You have spotting or bleeding from your vagina.  You have severe abdominal cramping or pain.  You have rapid weight gain or loss.  You vomit blood or material that looks like coffee grounds.  You are exposed to [place] measles and have never had them.  You are exposed to fifth disease or chickenpox.  You develop a severe headache.  You have shortness of breath.  You have any kind of trauma, such as from a fall or a car accident. Document Released: 03/04/2001 Document Revised: 07/25/2013 Document Reviewed: 01/18/2013 [facility] Patient Information 2015 [place], [place]. This information is not intended to replace advice given to you by your health care provider. Make sure you discuss any questions you have with your health care provider.

## 2020-01-24 NOTE — Progress Notes (Signed)
INITIAL OBSTETRICAL VISIT Patient name: MENUCHA DICESARE MRN [id number]  Date of birth: [birth year] Chief Complaint:   Initial Prenatal Visit (nt/it)  History of Present Illness:   NARMEEN KERPER is a 34 y.o. G34P1011 African American female at [redacted]w[redacted]d by [name] at 10 weeks with an Estimated Date of Delivery: 08/07/20 being seen today for her initial obstetrical visit.   Her obstetrical history is significant for term uncomplicated SVB x 1, SAB x 1. CHTN on norvasc prior to pregnancy, switched to Labetalol 200mg  BID by PCP, is taking only qhs right now.  Dx w/ T2DM 07/07/19 w/ A1C 6.5, had been watching/changing diet, seeing dietician as it was 6.3 in Oct 2020. Never started on meds, improved her diet. A1C 5.7 on 01/04/20 w/ PCP.  Dep/anx- on cymbalta prior to pregnancy, quit w/ +PT, feels like she is overall doing ok. OK to resume if she feels she needs to.  Fibromyalgia Today she reports some nausea- dramamine helps.  Depression screen [facility] 2/9 01/24/2020 01/04/2020 07/07/2019 02/09/2019 02/03/2019  Decreased Interest 0 0 0 0 0  Down, Depressed, Hopeless 0 0 0 0 0  PHQ - 2 Score 0 0 0 0 0  Altered sleeping 0 - - - -  Tired, decreased energy 2 - - - -  Change in appetite 0 - - - -  Feeling bad or failure about yourself  0 - - - -  Trouble concentrating 0 - - - -  Moving slowly or fidgety/restless 0 - - - -  Suicidal thoughts 0 - - - -  PHQ-9 Score 2 - - - -    No LMP recorded (lmp unknown). Patient is pregnant. Last pap 02/09/19. Results were: normal Review of Systems:   Pertinent items are noted in HPI Denies cramping/contractions, leakage of fluid, vaginal bleeding, abnormal vaginal discharge w/ itching/odor/irritation, headaches, visual changes, shortness of breath, chest pain, abdominal pain, severe nausea/vomiting, or problems with urination or bowel movements unless otherwise stated above.  Pertinent History Reviewed:  Reviewed past medical,surgical, social, obstetrical and  family history.  Reviewed problem list, medications and allergies. OB History  Gravida Para Term Preterm AB Living  3 1 1   1 1   SAB TAB Ectopic Multiple Live Births  1       1    # Outcome Date GA Lbr Len/2nd Weight Sex Delivery Anes PTL Lv  3 Current           2 SAB 08/2018          1 Term 10/29/12 [redacted]w[redacted]d 14:45 / 00:42 6 lb 2.4 oz (2.79 kg) F Vag-Spont EPI N LIV   Physical Assessment:   Vitals:   01/24/20 1004  BP: 104/66  Pulse: 93  Weight: 248 lb 12.8 oz (112.9 kg)  Body mass index is 47.01 kg/m.       Physical Examination:  General appearance - well appearing, and in no distress  Mental status - alert, oriented to person, place, and time  Psych:  She has a normal mood and affect  Skin - warm and dry, normal color, no suspicious lesions noted  Chest - effort normal, all lung fields clear to auscultation bilaterally  Heart - normal rate and regular rhythm  Abdomen - soft, nontender  Extremities:  No swelling or varicosities noted  Thin prep pap is not done  Chaperone: N/A    TODAY'S NT [redacted]w[redacted]d 12 wks,measurements c/w dates,crl 50.14 mm,fhr 174 bpm,posterior placenta,normal ovaries,NB present,NT 1 mm  Results for orders placed or performed in visit on 01/24/20 (from the past 24 hour(s))  POC Urinalysis Dipstick OB   Collection Time: 01/24/20 10:38 AM  Result Value Ref Range   Color, UA     Clarity, UA     Glucose, UA Negative Negative   Bilirubin, UA     Ketones, UA neg    Spec Grav, UA     Blood, UA trace    pH, UA     POC,PROTEIN,UA Negative Negative, Trace, Small (1+), Moderate (2+), Large (3+), 4+   Urobilinogen, UA     Nitrite, UA neg    Leukocytes, UA Negative Negative   Appearance     Odor      Assessment & Plan:  1) High-Risk Pregnancy G3P1011 at [redacted]w[redacted]d with an Estimated Date of Delivery: 08/07/20   2) Initial OB visit  3) CHTN> on Labetalol 200mg  qhs, baseline labs today, rx ASA 162mg  daily  4) ?DM vs pre-DM> A1C 6.5 (April 2021), 5.7 (Oct 2021)  on no meds. Will get A1C today, start checking sugars QID and bring log to next appt  5) Fibromyalgia  Meds:  Meds ordered this encounter  Medications  . Blood Pressure Monitor MISC    Sig: For regular home bp monitoring during pregnancy    Dispense:  1 each    Refill:  0    O09.90 Needs large cuff  . aspirin 81 MG EC tablet    Sig: Take 2 tablets (162 mg total) by mouth daily. Swallow whole.    Dispense:  60 tablet    Refill:  12    Order Specific Question:   Supervising Provider    Answer:   02-11-1979, LUTHER H [[id number]]  . Accu-Chek Softclix Lancets lancets    Sig: Use as instructed    Dispense:  100 each    Refill:  12    QS for QID testing    Order Specific Question:   Supervising Provider    Answer:   12-23-1968, LUTHER H [[id number]]  . glucose blood test strip    Sig: Use as instructed    Dispense:  200 each    Refill:  12    QS for QID testing    Order Specific Question:   Supervising Provider    Answer:   Despina Hidden [[id number]]    Initial labs obtained Continue prenatal vitamins Reviewed n/v relief measures and warning s/s to report Reviewed recommended weight gain based on pre-gravid BMI Encouraged well-balanced diet Genetic & carrier screening discussed: requests Panorama, NT/IT and Horizon 14  Ultrasound discussed; fetal survey: requested [facility] completed> form faxed if has or is planning to apply for medicaid The nature of Despina Hidden for [facility] with multiple MDs and other Advanced Practice Providers was explained to patient; also emphasized that fellows, residents, and students are part of our team. Does not have home bp cuff. Rx faxed to [facility]. Check bp weekly, let [employer] know if >140/90.   Follow-up: Return in about 3 weeks (around 02/14/2020) for HROB, 2nd IT, in person, MD or CNM.   Orders Placed This Encounter  Procedures  . Urine Culture  . GC/Chlamydia Probe Amp  . Genetic Screening  . Pain Management Screening Profile (10S)  .  CBC/D/Plt+RPR+Rh+ABO+Rub Ab...  . Comprehensive metabolic panel  . Protein / creatinine ratio, urine  . Integrated 1  . Hemoglobin A1c  . POC Urinalysis Dipstick OB    [name] CNM, [facility] 01/24/2020  12:44 PM

## 2020-01-24 NOTE — Progress Notes (Signed)
   NURSE VISIT- [facility]  SUBJECTIVE:  Theresa ACHEY is a 34 y.o. G59P1011 female here for Panorama NIPT and Horizon Carrier Screening . She is [redacted]w[redacted]d pregnant.   OBJECTIVE:  Appears well, in no apparent distress  Blood work drawn from right [facility] without difficulty. 1 attempt(s).   ASSESSMENT: Pregnancy [redacted]w[redacted]d Panorama NIPT and Horizon Carrier Screening  PLAN: [facility] portal information given and instructed patient how to access results Follow-up as scheduled  Jobe Marker  01/24/2020 10:38 AM

## 2020-01-25 LAB — HCV INTERPRETATION

## 2020-01-25 LAB — CBC/D/PLT+RPR+RH+ABO+RUB AB...
Antibody Screen: NEGATIVE
Basophils Absolute: 0 10*3/uL (ref 0.0–0.2)
Basos: 0 %
EOS (ABSOLUTE): 0.1 10*3/uL (ref 0.0–0.4)
Eos: 1 %
HCV Ab: <0.1 s/co ratio (ref 0.0–0.9)
HIV Screen 4th Generation wRfx: NONREACTIVE
Hematocrit: 37.8 % (ref 34.0–46.6)
Hemoglobin: 11.6 g/dL (ref 11.1–15.9)
Hepatitis B Surface Ag: NEGATIVE
Immature Grans (Abs): 0 10*3/uL (ref 0.0–0.1)
Immature Granulocytes: 0 %
Lymphocytes Absolute: 2.5 10*3/uL (ref 0.7–3.1)
Lymphs: 28 %
MCH: 21 pg — ABNORMAL LOW (ref 26.6–33.0)
MCHC: 30.7 g/dL — ABNORMAL LOW (ref 31.5–35.7)
MCV: 69 fL — ABNORMAL LOW (ref 79–97)
Monocytes Absolute: 0.8 10*3/uL (ref 0.1–0.9)
Monocytes: 9 %
Neutrophils Absolute: 5.4 10*3/uL (ref 1.4–7.0)
Neutrophils: 62 %
Platelets: 456 10*3/uL — ABNORMAL HIGH (ref 150–450)
RBC: 5.52 x10E6/uL — ABNORMAL HIGH (ref 3.77–5.28)
RDW: 18.4 % — ABNORMAL HIGH (ref 11.7–15.4)
RPR Ser Ql: NONREACTIVE
Rh Factor: POSITIVE
Rubella Antibodies, IGG: 1.51 index (ref >0.99)
WBC: 8.8 10*3/uL (ref 3.4–10.8)

## 2020-01-25 LAB — COMPREHENSIVE METABOLIC PANEL
ALT: 11 IU/L (ref 0–32)
AST: 10 IU/L (ref 0–40)
Albumin/Globulin Ratio: 1.3 (ref 1.2–2.2)
Albumin: 4 g/dL (ref 3.8–4.8)
Alkaline Phosphatase: 93 IU/L (ref 44–121)
BUN/Creatinine Ratio: 12 (ref 9–23)
BUN: 6 mg/dL (ref 6–20)
Bilirubin Total: 0.2 mg/dL (ref 0.0–1.2)
CO2: 22 mmol/L (ref 20–29)
Calcium: 9.3 mg/dL (ref 8.7–10.2)
Chloride: 101 mmol/L (ref 96–106)
Creatinine, Ser: 0.5 mg/dL — ABNORMAL LOW (ref 0.57–1.00)
GFR calc Af Amer: 146 mL/min/{1.73_m2} (ref >59)
GFR calc non Af Amer: 127 mL/min/{1.73_m2} (ref >59)
Globulin, Total: 3 g/dL (ref 1.5–4.5)
Glucose: 77 mg/dL (ref 65–99)
Potassium: 4.3 mmol/L (ref 3.5–5.2)
Sodium: 134 mmol/L (ref 134–144)
Total Protein: 7 g/dL (ref 6.0–8.5)

## 2020-01-25 LAB — PROTEIN / CREATININE RATIO, URINE
Creatinine, Urine: 82.2 mg/dL
Protein, Ur: 6.7 mg/dL
Protein/Creat Ratio: 82 mg/g creat (ref 0–200)

## 2020-01-25 LAB — HEMOGLOBIN A1C
Est. average glucose Bld gHb Est-mCnc: 128 mg/dL
Hgb A1c MFr Bld: 6.1 % — ABNORMAL HIGH (ref 4.8–5.6)

## 2020-01-26 ENCOUNTER — Telehealth: Payer: Self-pay | Admitting: Women's Health

## 2020-01-26 DIAGNOSIS — O0990 Supervision of high risk pregnancy, unspecified, unspecified trimester: Secondary | ICD-10-CM | POA: Diagnosis not present

## 2020-01-26 LAB — GC/CHLAMYDIA PROBE AMP
Chlamydia trachomatis, NAA: NEGATIVE
Neisseria Gonorrhoeae by PCR: NEGATIVE

## 2020-01-26 LAB — PMP SCREEN PROFILE (10S), URINE
Amphetamine Scrn, Ur: NEGATIVE ng/mL
BARBITURATE SCREEN URINE: NEGATIVE ng/mL
BENZODIAZEPINE SCREEN, URINE: NEGATIVE ng/mL
CANNABINOIDS UR QL SCN: NEGATIVE ng/mL
Cocaine (Metab) Scrn, Ur: NEGATIVE ng/mL
Creatinine(Crt), U: 87.9 mg/dL (ref 20.0–300.0)
Methadone Screen, Urine: NEGATIVE ng/mL
OXYCODONE+OXYMORPHONE UR QL SCN: NEGATIVE ng/mL
Opiate Scrn, Ur: NEGATIVE ng/mL
Ph of Urine: 7.2 (ref 4.5–8.9)
Phencyclidine Qn, Ur: NEGATIVE ng/mL
Propoxyphene Scrn, Ur: NEGATIVE ng/mL

## 2020-01-26 LAB — INTEGRATED 1
Crown Rump Length: 50.1 mm
Gest. Age on Collection Date: 11.6 weeks
Maternal Age at EDD: 34.7 yr
Nuchal Translucency (NT): 1 mm
Number of Fetuses: 1
PAPP-A Value: 679.5 ng/mL
Weight: 249 [lb_av]

## 2020-01-26 NOTE — Telephone Encounter (Signed)
Pt experiencing numbness & pain at site of finger sticks, hard to get blood out Is there a certain way she should be doing it?  Please advise & call pt

## 2020-01-26 NOTE — Telephone Encounter (Signed)
Spoke with patient she states that when she sticks her fingers not much blood comes out. I advised trying to warm her hands and check the depth that lancet is set on. Pt to try these things and let us know if she has any other questions.

## 2020-01-27 LAB — URINE CULTURE

## 2020-01-31 ENCOUNTER — Other Ambulatory Visit: Payer: Self-pay | Admitting: Women's Health

## 2020-02-03 ENCOUNTER — Other Ambulatory Visit: Payer: Medicaid Other

## 2020-02-03 DIAGNOSIS — Z3480 Encounter for supervision of other normal pregnancy, unspecified trimester: Secondary | ICD-10-CM

## 2020-02-03 DIAGNOSIS — Z3A13 13 weeks gestation of pregnancy: Secondary | ICD-10-CM

## 2020-02-04 LAB — GLUCOSE TOLERANCE, 2 HOURS W/ 1HR
Glucose, 1 hour: 133 mg/dL (ref 65–179)
Glucose, 2 hour: 89 mg/dL (ref 65–152)
Glucose, Fasting: 84 mg/dL (ref 65–91)

## 2020-02-06 ENCOUNTER — Encounter: Payer: Self-pay | Admitting: Women's Health

## 2020-02-06 DIAGNOSIS — O285 Abnormal chromosomal and genetic finding on antenatal screening of mother: Secondary | ICD-10-CM | POA: Insufficient documentation

## 2020-02-06 HISTORY — DX: Abnormal chromosomal and genetic finding on antenatal screening of mother: O28.5

## 2020-02-14 ENCOUNTER — Ambulatory Visit (INDEPENDENT_AMBULATORY_CARE_PROVIDER_SITE_OTHER): Payer: Medicaid Other | Admitting: Women's Health

## 2020-02-14 ENCOUNTER — Other Ambulatory Visit: Payer: Self-pay

## 2020-02-14 ENCOUNTER — Encounter: Payer: Self-pay | Admitting: Women's Health

## 2020-02-14 VITALS — BP 140/80 | HR 96 | Wt 250.0 lb

## 2020-02-14 DIAGNOSIS — F5089 Other specified eating disorder: Secondary | ICD-10-CM | POA: Diagnosis not present

## 2020-02-14 DIAGNOSIS — O0992 Supervision of high risk pregnancy, unspecified, second trimester: Secondary | ICD-10-CM

## 2020-02-14 DIAGNOSIS — Z1389 Encounter for screening for other disorder: Secondary | ICD-10-CM

## 2020-02-14 DIAGNOSIS — Z331 Pregnant state, incidental: Secondary | ICD-10-CM

## 2020-02-14 DIAGNOSIS — Z3A15 15 weeks gestation of pregnancy: Secondary | ICD-10-CM

## 2020-02-14 DIAGNOSIS — Z23 Encounter for immunization: Secondary | ICD-10-CM

## 2020-02-14 DIAGNOSIS — I10 Essential (primary) hypertension: Secondary | ICD-10-CM

## 2020-02-14 LAB — POCT URINALYSIS DIPSTICK OB
Glucose, UA: NEGATIVE
Ketones, UA: NEGATIVE
Nitrite, UA: NEGATIVE
POC,PROTEIN,UA: NEGATIVE

## 2020-02-14 LAB — POCT HEMOGLOBIN: Hemoglobin: 12.8 g/dL (ref 11–14.6)

## 2020-02-14 MED ORDER — DOXYLAMINE-PYRIDOXINE 10-10 MG PO TBEC
DELAYED_RELEASE_TABLET | ORAL | 6 refills | Status: DC
Start: 2020-02-14 — End: 2020-07-25

## 2020-02-14 NOTE — Patient Instructions (Signed)
Theresa Rosario, I greatly value your feedback.  If you receive a survey following your visit with [name] today, we appreciate you taking the time to fill it out.  Thanks, Theresa Rosario, CNM, WHNP-BC  [facility] ([address] [place], [place] [address]) Entrance C, located off of [address] valet parking  Go to [email or web address] to register for FREE online childbirth classes  [place] [facility]ians/Family Doctors:  [facility] [phone]            [facility] [phone]                 [facility] [phone] (usually not accepting new patients unless you have family there already, you are always welcome to call and ask)       [facility] [phone]       [facility] Pediatricians/Family Doctors:   [facility]: [phone]  [facility]: [phone]  [facility]: [phone]  [facility] Doctors:   [facility]: [phone]   [facility]: [phone]  [facility] Doctors:  [facility]: [phone]    Home Blood Pressure Monitoring for Patients   Your provider has recommended that you check your blood pressure (BP) at least once a week at home. If you do not have a blood pressure cuff at home, one will be provided for you. Contact your provider if you have not received your monitor within 1 week.   Helpful Tips for Accurate Home Blood Pressure Checks   Don't smoke, exercise, or drink caffeine 30 minutes before checking your BP  Use the restroom before checking your BP (a full bladder can raise your pressure)  Relax in a comfortable upright chair  Feet on the ground  Left arm resting comfortably on a flat surface at the level of your heart  Legs uncrossed  Back supported  Sit quietly and don't talk  Place the cuff on your bare arm  Adjust snuggly,  so that only two fingertips can fit between your skin and the top of the cuff  Check 2 readings separated by at least one minute  Keep a log of your BP readings  For a visual, please reference this diagram: http://ccnc.care/bpdiagram  Provider Name: [facility]     Phone: [phone]  Zone 1: ALL CLEAR  Continue to monitor your symptoms:   BP reading is less than 140 (top number) or less than 90 (bottom number)   No right upper stomach pain  No headaches or seeing spots  No feeling nauseated or throwing up  No swelling in face and hands  Zone 2: CAUTION Call your doctor's office for any of the following:   BP reading is greater than 140 (top number) or greater than 90 (bottom number)   Stomach pain under your ribs in the middle or right side  Headaches or seeing spots  Feeling nauseated or throwing up  Swelling in face and hands  Zone 3: EMERGENCY  Seek immediate medical care if you have any of the following:   BP reading is greater than160 (top number) or greater than 110 (bottom number)  Severe headaches not improving with Tylenol  Serious difficulty catching your breath  Any worsening symptoms from Zone 2     Second Trimester of Pregnancy The second trimester is from week 14 through week 27 (months 4 through 6). The second trimester is often a time when you feel your  best. Your body has adjusted to being pregnant, and you begin to feel better physically. Usually, morning sickness has lessened or quit completely, you may have more energy, and you may have an increase in appetite. The second trimester is also a time when the fetus is growing rapidly. At the end of the sixth month, the fetus is about 9 inches long and weighs about 1 pounds. You will likely begin to feel the baby move (quickening) between 16 and 20 weeks of pregnancy. Body changes during your second trimester Your body continues to go through many changes during your second trimester. The  changes vary from woman to woman.  Your weight will continue to increase. You will notice your lower abdomen bulging out.  You may begin to get stretch marks on your hips, abdomen, and breasts.  You may develop headaches that can be relieved by medicines. The medicines should be approved by your health care provider.  You may urinate more often because the fetus is pressing on your bladder.  You may develop or continue to have heartburn as a result of your pregnancy.  You may develop constipation because certain hormones are causing the muscles that push waste through your intestines to slow down.  You may develop hemorrhoids or swollen, bulging veins (varicose veins).  You may have back pain. This is caused by: ? Weight gain. ? Pregnancy hormones that are relaxing the joints in your pelvis. ? A shift in weight and the muscles that support your balance.  Your breasts will continue to grow and they will continue to become tender.  Your gums may bleed and may be sensitive to brushing and flossing.  Dark spots or blotches (chloasma, mask of pregnancy) may develop on your face. This will likely fade after the baby is born.  A dark line from your belly button to the pubic area (linea nigra) may appear. This will likely fade after the baby is born.  You may have changes in your hair. These can include thickening of your hair, rapid growth, and changes in texture. Some women also have hair loss during or after pregnancy, or hair that feels dry or thin. Your hair will most likely return to normal after your baby is born.  What to expect at prenatal visits During a routine prenatal visit:  You will be weighed to make sure you and the fetus are growing normally.  Your blood pressure will be taken.  Your abdomen will be measured to track your baby's growth.  The fetal heartbeat will be listened to.  Any test results from the previous visit will be discussed.  Your health care  provider may ask you:  How you are feeling.  If you are feeling the baby move.  If you have had any abnormal symptoms, such as leaking fluid, bleeding, severe headaches, or abdominal cramping.  If you are using any tobacco products, including cigarettes, chewing tobacco, and electronic cigarettes.  If you have any questions.  Other tests that may be performed during your second trimester include:  Blood tests that check for: ? Low iron levels (anemia). ? High blood sugar that affects pregnant women (gestational diabetes) between 52 and 28 weeks. ? Rh antibodies. This is to check for a protein on red blood cells (Rh factor).  Urine tests to check for infections, diabetes, or protein in the urine.  An ultrasound to confirm the proper growth and development of the baby.  An amniocentesis to check for possible genetic problems.  Fetal  screens for spina bifida and Down syndrome.  HIV (human immunodeficiency virus) testing. Routine prenatal testing includes screening for HIV, unless you choose not to have this test.  Follow these instructions at home: Medicines  Follow your health care provider's instructions regarding medicine use. Specific medicines may be either safe or unsafe to take during pregnancy.  Take a prenatal vitamin that contains at least 600 micrograms (mcg) of folic acid.  If you develop constipation, try taking a stool softener if your health care provider approves. Eating and drinking  Eat a balanced diet that includes fresh fruits and vegetables, whole grains, good sources of protein such as meat, eggs, or tofu, and low-fat dairy. Your health care provider will help you determine the amount of weight gain that is right for you.  Avoid raw meat and uncooked cheese. These carry germs that can cause birth defects in the baby.  If you have low calcium intake from food, talk to your health care provider about whether you should take a daily calcium  supplement.  Limit foods that are high in fat and processed sugars, such as fried and sweet foods.  To prevent constipation: ? Drink enough fluid to keep your urine clear or pale yellow. ? Eat foods that are high in fiber, such as fresh fruits and vegetables, whole grains, and beans. Activity  Exercise only as directed by your health care provider. Most women can continue their usual exercise routine during pregnancy. Try to exercise for 30 minutes at least 5 days a week. Stop exercising if you experience uterine contractions.  Avoid heavy lifting, wear low heel shoes, and practice good posture.  A sexual relationship may be continued unless your health care provider directs you otherwise. Relieving pain and discomfort  Wear a good support bra to prevent discomfort from breast tenderness.  Take warm sitz baths to soothe any pain or discomfort caused by hemorrhoids. Use hemorrhoid cream if your health care provider approves.  Rest with your legs elevated if you have leg cramps or low back pain.  If you develop varicose veins, wear support hose. Elevate your feet for 15 minutes, 3-4 times a day. Limit salt in your diet. Prenatal Care  Write down your questions. Take them to your prenatal visits.  Keep all your prenatal visits as told by your health care provider. This is important. Safety  Wear your seat belt at all times when driving.  Make a list of emergency phone numbers, including numbers for family, friends, the hospital, and police and fire departments. General instructions  Ask your health care provider for a referral to a local prenatal education class. Begin classes no later than the beginning of month 6 of your pregnancy.  Ask for help if you have counseling or nutritional needs during pregnancy. Your health care provider can offer advice or refer you to specialists for help with various needs.  Do not use hot tubs, steam rooms, or saunas.  Do not douche or use  tampons or scented sanitary pads.  Do not cross your legs for long periods of time.  Avoid cat litter boxes and soil used by cats. These carry germs that can cause birth defects in the baby and possibly loss of the fetus by miscarriage or stillbirth.  Avoid all smoking, herbs, alcohol, and unprescribed drugs. Chemicals in these products can affect the formation and growth of the baby.  Do not use any products that contain nicotine or tobacco, such as cigarettes and e-cigarettes. If you need help  quitting, ask your health care provider.  Visit your dentist if you have not gone yet during your pregnancy. Use a soft toothbrush to brush your teeth and be gentle when you floss. Contact a health care provider if:  You have dizziness.  You have mild pelvic cramps, pelvic pressure, or nagging pain in the abdominal area.  You have persistent nausea, vomiting, or diarrhea.  You have a bad smelling vaginal discharge.  You have pain when you urinate. Get help right away if:  You have a fever.  You are leaking fluid from your vagina.  You have spotting or bleeding from your vagina.  You have severe abdominal cramping or pain.  You have rapid weight gain or weight loss.  You have shortness of breath with chest pain.  You notice sudden or extreme swelling of your face, hands, ankles, feet, or legs.  You have not felt your baby move in over an hour.  You have severe headaches that do not go away when you take medicine.  You have vision changes. Summary  The second trimester is from week 14 through week 27 (months 4 through 6). It is also a time when the fetus is growing rapidly.  Your body goes through many changes during pregnancy. The changes vary from woman to woman.  Avoid all smoking, herbs, alcohol, and unprescribed drugs. These chemicals affect the formation and growth your baby.  Do not use any tobacco products, such as cigarettes, chewing tobacco, and e-cigarettes. If you  need help quitting, ask your health care provider.  Contact your health care provider if you have any questions. Keep all prenatal visits as told by your health care provider. This is important. This information is not intended to replace advice given to you by your health care provider. Make sure you discuss any questions you have with your health care provider. Document Released: 03/04/2001 Document Revised: 08/16/2015 Document Reviewed: 05/11/2012 Elsevier Interactive Patient Education  2017 [employer].

## 2020-02-14 NOTE — Progress Notes (Signed)
HIGH-RISK PREGNANCY VISIT Patient name: Theresa Rosario MRN [id number]  Date of birth: 25-Jan-1986 Chief Complaint:   High Risk Gestation (craving ice; requests something for nausea)  History of Present Illness:   Theresa Rosario is a 34 y.o. G22P1011 female at [redacted]w[redacted]d with an Estimated Date of Delivery: 08/07/20 being seen today for ongoing management of a high-risk pregnancy complicated by chronic hypertension currently on Labetalol 200mg  q hs.  Today she reports dramamine not working for nausea- requests rx. Craving ice.  Depression screen [facility] 2/9 01/24/2020 01/04/2020 07/07/2019 02/09/2019 02/03/2019  Decreased Interest 0 0 0 0 0  Down, Depressed, Hopeless 0 0 0 0 0  PHQ - 2 Score 0 0 0 0 0  Altered sleeping 0 - - - -  Tired, decreased energy 2 - - - -  Change in appetite 0 - - - -  Feeling bad or failure about yourself  0 - - - -  Trouble concentrating 0 - - - -  Moving slowly or fidgety/restless 0 - - - -  Suicidal thoughts 0 - - - -  PHQ-9 Score 2 - - - -    Contractions: Not present. Vag. Bleeding: None.  Movement: Absent. denies leaking of fluid.  Review of Systems:   Pertinent items are noted in HPI Denies abnormal vaginal discharge w/ itching/odor/irritation, headaches, visual changes, shortness of breath, chest pain, abdominal pain, severe nausea/vomiting, or problems with urination or bowel movements unless otherwise stated above. Pertinent History Reviewed:  Reviewed past medical,surgical, social, obstetrical and family history.  Reviewed problem list, medications and allergies. Physical Assessment:   Vitals:   02/14/20 1349  BP: 140/80  Pulse: 96  Weight: 250 lb (113.4 kg)  Body mass index is 47.24 kg/m.           Physical Examination:   General appearance: alert, well appearing, and in no distress  Mental status: alert, oriented to person, place, and time  Skin: warm & dry   Extremities: Edema: None    Cardiovascular: normal heart rate  noted  Respiratory: normal respiratory effort, no distress  Abdomen: gravid, soft, non-tender  Pelvic: Cervical exam deferred         Fetal Status: Fetal Heart Rate (bpm): 159   Movement: Absent    Fetal Surveillance Testing today: doppler   Chaperone: N/A    Results for orders placed or performed in visit on 02/14/20 (from the past 24 hour(s))  POC Urinalysis Dipstick OB   Collection Time: 02/14/20  1:50 PM  Result Value Ref Range   Color, UA     Clarity, UA     Glucose, UA Negative Negative   Bilirubin, UA     Ketones, UA neg    Spec Grav, UA     Blood, UA trace    pH, UA     POC,PROTEIN,UA Negative Negative, Trace, Small (1+), Moderate (2+), Large (3+), 4+   Urobilinogen, UA     Nitrite, UA neg    Leukocytes, UA Trace (A) Negative   Appearance     Odor    POCT hemoglobin   Collection Time: 02/14/20  2:02 PM  Result Value Ref Range   Hemoglobin 12.8 11 - 14.6 g/dL    Assessment & Plan:  High-risk pregnancy: G3P1011 at [redacted]w[redacted]d with an Estimated Date of Delivery: 08/07/20   1) CHTN, stable on Labetalol 200mg  qhs  2) H/O T2DM, A1C 6.1 w/ normal 2hr GTT, repeat GTT @ 26-28wks  3) +silent carrier for alpha-thalassemia  and SMA> FOB does want testing, note routed to Tish  4) Nausea> rx diclegis  5) Craving ice> hgb 12.8  Meds:  Meds ordered this encounter  Medications  . Doxylamine-Pyridoxine (DICLEGIS) 10-10 MG TBEC    Sig: 2 tabs q hs, if sx persist add 1 tab q am on day 3, if sx persist add 1 tab q afternoon on day 4    Dispense:  100 tablet    Refill:  6    Order Specific Question:   Supervising Provider    Answer:   Lazaro Arms [[id number]]    Labs/procedures today: hgb, flu shot  Treatment Plan:  Growth u/s q4wks    2x/wk testing nst/sono @ 32wks     Deliver 38-39wks (37wks or prn if poor control)____   Reviewed: Preterm labor symptoms and general obstetric precautions including but not limited to vaginal bleeding, contractions, leaking of fluid and fetal  movement were reviewed in detail with the patient.  All questions were answered. Has home bp cuff. Check bp weekly, let us know if >140/90.   Follow-up: Return in about 3 weeks (around 03/06/2020) for HROB, DE:[id number], 2nd IT, in person, MD or CNM.   Future Appointments  Date Time Provider Department Center  03/06/2020  2:15 PM [facility] [name] [facility] None  03/06/2020  3:10 PM Lazaro Arms, MD [facility]  07/04/2020  2:00 PM [name], MD [facility]    Orders Placed This Encounter  Procedures  . US OB Comp + 14 Wk  . Flu Vaccine QUAD 36+ mos IM  . POC Urinalysis Dipstick OB  . POCT hemoglobin   Cheral Marker CNM, [facility] 02/14/2020 2:15 PM

## 2020-02-20 ENCOUNTER — Encounter: Payer: Self-pay | Admitting: *Deleted

## 2020-02-29 ENCOUNTER — Other Ambulatory Visit: Payer: Self-pay | Admitting: Internal Medicine

## 2020-02-29 ENCOUNTER — Telehealth: Payer: Self-pay | Admitting: *Deleted

## 2020-02-29 DIAGNOSIS — I10 Essential (primary) hypertension: Secondary | ICD-10-CM

## 2020-02-29 NOTE — Telephone Encounter (Signed)
LVM for pt to call the office regarding blood pressure medication. Dr Allena Katz would like for her to get this prescribed by the [facility] as they have recently changed the dose so he would feel more comfortable with them prescribing

## 2020-02-29 NOTE — Telephone Encounter (Signed)
Pt called back let her know dr patels recommendation. She stated the ob never changed her blood pressure medication and she did not request a refill on the medication. She has enough of the medication and she would call when this needed refilling FYI

## 2020-02-29 NOTE — Telephone Encounter (Signed)
Pt LVM returning call I attempted to call her back LVM

## 2020-03-06 ENCOUNTER — Ambulatory Visit (INDEPENDENT_AMBULATORY_CARE_PROVIDER_SITE_OTHER): Payer: Medicaid Other

## 2020-03-06 ENCOUNTER — Encounter: Payer: Self-pay | Admitting: Obstetrics & Gynecology

## 2020-03-06 ENCOUNTER — Ambulatory Visit (INDEPENDENT_AMBULATORY_CARE_PROVIDER_SITE_OTHER): Payer: Medicaid Other | Admitting: Obstetrics & Gynecology

## 2020-03-06 ENCOUNTER — Other Ambulatory Visit: Payer: Self-pay

## 2020-03-06 VITALS — BP 126/76 | HR 99 | Wt 249.5 lb

## 2020-03-06 DIAGNOSIS — R7303 Prediabetes: Secondary | ICD-10-CM

## 2020-03-06 DIAGNOSIS — O0992 Supervision of high risk pregnancy, unspecified, second trimester: Secondary | ICD-10-CM

## 2020-03-06 DIAGNOSIS — Z1379 Encounter for other screening for genetic and chromosomal anomalies: Secondary | ICD-10-CM | POA: Diagnosis not present

## 2020-03-06 DIAGNOSIS — Z3A18 18 weeks gestation of pregnancy: Secondary | ICD-10-CM

## 2020-03-06 DIAGNOSIS — I10 Essential (primary) hypertension: Secondary | ICD-10-CM | POA: Diagnosis not present

## 2020-03-06 NOTE — Progress Notes (Signed)
   HIGH-RISK PREGNANCY VISIT Patient name: Theresa Rosario MRN [id number]  Date of birth: 02/25/1986 Chief Complaint:   Routine Prenatal Visit  History of Present Illness:   Theresa Rosario is a 34 y.o. G6P1011 female at [redacted]w[redacted]d with an Estimated Date of Delivery: 08/07/20 being seen today for ongoing management of a high-risk pregnancy complicated by [facility].  Today she reports no complaints.  Depression screen [facility] 2/9 01/24/2020 01/04/2020 07/07/2019 02/09/2019 02/03/2019  Decreased Interest 0 0 0 0 0  Down, Depressed, Hopeless 0 0 0 0 0  PHQ - 2 Score 0 0 0 0 0  Altered sleeping 0 - - - -  Tired, decreased energy 2 - - - -  Change in appetite 0 - - - -  Feeling bad or failure about yourself  0 - - - -  Trouble concentrating 0 - - - -  Moving slowly or fidgety/restless 0 - - - -  Suicidal thoughts 0 - - - -  PHQ-9 Score 2 - - - -    Contractions: Not present. Vag. Bleeding: None.  Movement: Absent. denies leaking of fluid.  Review of Systems:   Pertinent items are noted in HPI Denies abnormal vaginal discharge w/ itching/odor/irritation, headaches, visual changes, shortness of breath, chest pain, abdominal pain, severe nausea/vomiting, or problems with urination or bowel movements unless otherwise stated above. Pertinent History Reviewed:  Reviewed past medical,surgical, social, obstetrical and family history.  Reviewed problem list, medications and allergies. Physical Assessment:   Vitals:   03/06/20 1507  BP: 126/76  Pulse: 99  Weight: 249 lb 8 oz (113.2 kg)  Body mass index is 47.14 kg/m.           Physical Examination:   General appearance: alert, well appearing, and in no distress  Mental status: alert, oriented to person, place, and time  Skin: warm & dry   Extremities: Edema: None    Cardiovascular: normal heart rate noted  Respiratory: normal respiratory effort, no distress  Abdomen: gravid, soft, non-tender  Pelvic: Cervical exam deferred         Fetal  Status:     Movement: Absent    Fetal Surveillance Testing today:    Chaperone: N/A    No results found for this or any previous visit (from the past 24 hour(s)).  Assessment & Plan:  High-risk pregnancy: G3P1011 at [redacted]w[redacted]d with an Estimated Date of Delivery: 08/07/20   1) CHTN on labetalol 200 mg daily,   2) Pre diabetic, normal 13 week 2 hour GTT,   Meds: No orders of the defined types were placed in this encounter.   Labs/procedures today:   Treatment Plan:  Follow up 4 weeks  Reviewed: Preterm labor symptoms and general obstetric precautions including but not limited to vaginal bleeding, contractions, leaking of fluid and fetal movement were reviewed in detail with the patient.  All questions were answered. Has home bp cuff. Rx faxed to . Check bp weekly, let us know if >140/90.   Follow-up: Return in about 4 weeks (around 04/03/2020) for HROB.   Future Appointments  Date Time Provider Department Center  07/04/2020  2:00 PM [name], MD [facility]    Orders Placed This Encounter  Procedures  . INTEGRATED 2   Lazaro Arms  03/06/2020 3:46 PM

## 2020-03-06 NOTE — Progress Notes (Signed)
[name] 18 wks,cephalic,posterior placenta gr 0,normal ovaries,cx 4.4 cm,svp of fluid 5.4 cm,EFW 227 g 55%,anatomy complete,no obvious abnormalities

## 2020-03-08 LAB — INTEGRATED 2
AFP MoM: 1.34
Alpha-Fetoprotein: 37.6 ng/mL
Crown Rump Length: 50.1 mm
DIA MoM: 0.59
DIA Value: 69.8 pg/mL
Estriol, Unconjugated: 1.15 ng/mL
Gest. Age on Collection Date: 11.6 weeks
Gestational Age: 17.6 weeks
Maternal Age at EDD: 34.7 yr
Nuchal Translucency (NT): 1 mm
Nuchal Translucency MoM: 0.89
Number of Fetuses: 1
PAPP-A MoM: 1.84
PAPP-A Value: 679.5 ng/mL
Test Results:: NEGATIVE
Weight: 249 [lb_av]
Weight: 249 [lb_av]
hCG MoM: 0.31
hCG Value: 5.8 IU/mL
uE3 MoM: 1.03

## 2020-03-21 ENCOUNTER — Encounter: Payer: Self-pay | Admitting: *Deleted

## 2020-03-21 DIAGNOSIS — O0992 Supervision of high risk pregnancy, unspecified, second trimester: Secondary | ICD-10-CM

## 2020-03-24 NOTE — L&D Delivery Note (Signed)
OB/GYN Faculty Practice Delivery Note  Theresa Rosario is a 35 y.o. G3P1011 s/p vaginal delivery at [redacted]w[redacted]d. She was scheduled for IOL on 07/24/20 given cHTN. However, she presented in latent labor on 07/22/20.   ROM: 3h 9m with clear fluid GBS Status: positive, adequate antibiotics prior to delivery   Maximum Maternal Temperature: 98.2 F  Labor Progress:  Pt presented to MAU on 07/22/20 for contractions and bloody show, found to be in latent labor. She was admitted to the L&D floor for start of labor. She continued to progress s/p AROM for clear fluid at 2320. She progressed to complete cervical dilation at 0240 and then had an uncomplicated delivery as noted below.   Delivery Date/Time: 07/23/20 at 0306 Delivery: Called to room and patient was complete and pushing. Head delivered ROA. Loose nuchal cord x1 present; reduced s/p delivery of fetal head. Shoulder and body delivered in usual fashion. Infant with spontaneous cry, placed on mother's abdomen, dried and stimulated. Cord clamped x 2 after 1-minute delay, and cut by father under my direct supervision. Cord blood drawn. Placenta delivered spontaneously with gentle cord traction. Fundus firm with massage and Pitocin. Labia, perineum, vagina, and cervix were inspected, notable for second degree perineal tear s/p repair with 3-0 vicryl.   Placenta: intact, 3 vessel cord, sent to L&D Complications: none Lacerations: none EBL: 300 cc Analgesia: epidural  Infant: viable female  APGARs 8 and 9  weight 3116g  Sheila Oats, MD OB Fellow, Faculty Practice 07/23/2020 5:22 AM

## 2020-03-29 ENCOUNTER — Other Ambulatory Visit: Payer: Self-pay | Admitting: Internal Medicine

## 2020-03-29 DIAGNOSIS — I10 Essential (primary) hypertension: Secondary | ICD-10-CM

## 2020-04-02 DIAGNOSIS — G4733 Obstructive sleep apnea (adult) (pediatric): Secondary | ICD-10-CM | POA: Diagnosis not present

## 2020-04-02 DIAGNOSIS — M545 Low back pain, unspecified: Secondary | ICD-10-CM | POA: Diagnosis not present

## 2020-04-02 DIAGNOSIS — M797 Fibromyalgia: Secondary | ICD-10-CM | POA: Diagnosis not present

## 2020-04-02 DIAGNOSIS — F419 Anxiety disorder, unspecified: Secondary | ICD-10-CM | POA: Diagnosis not present

## 2020-04-02 DIAGNOSIS — M542 Cervicalgia: Secondary | ICD-10-CM | POA: Diagnosis not present

## 2020-04-03 ENCOUNTER — Encounter: Payer: Medicaid Other | Admitting: Women's Health

## 2020-04-04 ENCOUNTER — Encounter: Payer: Medicaid Other | Admitting: Advanced Practice Midwife

## 2020-04-04 ENCOUNTER — Telehealth: Payer: Self-pay | Admitting: Women's Health

## 2020-04-04 ENCOUNTER — Encounter: Payer: Self-pay | Admitting: *Deleted

## 2020-04-04 NOTE — Telephone Encounter (Signed)
Patient has tested positive for covid and wants to know what can she take for medicine, if any. Per patient. Clinical staff will follow up with patient.

## 2020-04-04 NOTE — Telephone Encounter (Signed)
Pt has tested + for Covid and has a cough, body aches and sneezing. Will send pt a list of meds that's safe to take to her MyChart. JSY

## 2020-04-11 ENCOUNTER — Encounter: Payer: Self-pay | Admitting: Obstetrics and Gynecology

## 2020-04-11 ENCOUNTER — Other Ambulatory Visit: Payer: Self-pay

## 2020-04-11 ENCOUNTER — Ambulatory Visit (INDEPENDENT_AMBULATORY_CARE_PROVIDER_SITE_OTHER): Payer: Medicaid Other | Admitting: Obstetrics and Gynecology

## 2020-04-11 VITALS — BP 97/64 | HR 94 | Wt 238.0 lb

## 2020-04-11 DIAGNOSIS — O285 Abnormal chromosomal and genetic finding on antenatal screening of mother: Secondary | ICD-10-CM

## 2020-04-11 DIAGNOSIS — Z1389 Encounter for screening for other disorder: Secondary | ICD-10-CM

## 2020-04-11 DIAGNOSIS — O0992 Supervision of high risk pregnancy, unspecified, second trimester: Secondary | ICD-10-CM

## 2020-04-11 DIAGNOSIS — Z3A23 23 weeks gestation of pregnancy: Secondary | ICD-10-CM

## 2020-04-11 DIAGNOSIS — I10 Essential (primary) hypertension: Secondary | ICD-10-CM

## 2020-04-11 LAB — POCT URINALYSIS DIPSTICK OB
Glucose, UA: NEGATIVE
Ketones, UA: NEGATIVE
Leukocytes, UA: NEGATIVE
Nitrite, UA: NEGATIVE

## 2020-04-11 NOTE — Progress Notes (Signed)
n

## 2020-04-11 NOTE — Patient Instructions (Signed)

## 2020-04-11 NOTE — Progress Notes (Signed)
Subjective:  Theresa Rosario is a 35 y.o. G3P1011 at [redacted]w[redacted]d being seen today for ongoing prenatal care.  She is currently monitored for the following issues for this high-risk pregnancy and has Fibromyalgia; Depression with anxiety; Chronic migraine; Family history of colon cancer in mother; GERD (gastroesophageal reflux disease); Hyperpigmented skin lesion; Prediabetes; Insomnia; Obstructive sleep apnea of adult; Chronic low back pain; Supervision of high-risk pregnancy; Chronic hypertension; and Abnormal chromosomal and genetic finding on antenatal screening mother on their problem list.  Patient reports no complaints.  Contractions: Not present. Vag. Bleeding: None.  Movement: Present. Denies leaking of fluid.   The following portions of the patient's history were reviewed and updated as appropriate: allergies, current medications, past family history, past medical history, past social history, past surgical history and problem list. Problem list updated.  Objective:   Vitals:   04/11/20 1340  BP: 97/64  Pulse: 94  Weight: 238 lb (108 kg)    Fetal Status:     Movement: Present     General:  Alert, oriented and cooperative. Patient is in no acute distress.  Skin: Skin is warm and dry. No rash noted.   Cardiovascular: Normal heart rate noted  Respiratory: Normal respiratory effort, no problems with respiration noted  Abdomen: Soft, gravid, appropriate for gestational age. Pain/Pressure: Absent     Pelvic:  Cervical exam deferred        Extremities: Normal range of motion.  Edema: None  Mental Status: Normal mood and affect. Normal behavior. Normal judgment and thought content.   Urinalysis:      Assessment and Plan:  Pregnancy: G3P1011 at [redacted]w[redacted]d  1. [redacted] weeks gestation of pregnancy  - POC Urinalysis Dipstick OB  2. Screening for genitourinary condition  - POC Urinalysis Dipstick OB  3. High-risk pregnancy in second trimester  - POC Urinalysis Dipstick OB  4. Supervision  of high risk pregnancy in second trimester Stable Glucola next visit  5. Chronic hypertension Stable Continue with Labetalol and qd BASA   6. Abnormal chromosomal and genetic finding on antenatal screening mother Satble  Preterm labor symptoms and general obstetric precautions including but not limited to vaginal bleeding, contractions, leaking of fluid and fetal movement were reviewed in detail with the patient. Please refer to After Visit Summary for other counseling recommendations.  Return in about 4 weeks (around 05/09/2020) for OB visit, glucola, face to face, any provider.   Hermina Staggers, MD

## 2020-05-11 ENCOUNTER — Other Ambulatory Visit: Payer: Self-pay

## 2020-05-11 ENCOUNTER — Ambulatory Visit (INDEPENDENT_AMBULATORY_CARE_PROVIDER_SITE_OTHER): Payer: Medicaid Other | Admitting: Women's Health

## 2020-05-11 ENCOUNTER — Other Ambulatory Visit: Payer: Medicaid Other

## 2020-05-11 VITALS — BP 110/60 | HR 96 | Wt 243.0 lb

## 2020-05-11 DIAGNOSIS — Z23 Encounter for immunization: Secondary | ICD-10-CM | POA: Diagnosis not present

## 2020-05-11 DIAGNOSIS — O0992 Supervision of high risk pregnancy, unspecified, second trimester: Secondary | ICD-10-CM

## 2020-05-11 DIAGNOSIS — Z131 Encounter for screening for diabetes mellitus: Secondary | ICD-10-CM

## 2020-05-11 DIAGNOSIS — Z3A27 27 weeks gestation of pregnancy: Secondary | ICD-10-CM

## 2020-05-11 DIAGNOSIS — I10 Essential (primary) hypertension: Secondary | ICD-10-CM

## 2020-05-11 DIAGNOSIS — Z1389 Encounter for screening for other disorder: Secondary | ICD-10-CM

## 2020-05-11 LAB — POCT URINALYSIS DIPSTICK OB
Blood, UA: NEGATIVE
Glucose, UA: NEGATIVE
Ketones, UA: NEGATIVE
Leukocytes, UA: NEGATIVE
Nitrite, UA: NEGATIVE
POC,PROTEIN,UA: NEGATIVE

## 2020-05-11 NOTE — Progress Notes (Signed)
HIGH-RISK PREGNANCY VISIT Patient name: Theresa Rosario MRN [id number]  Date of birth: 12-02-1985 Chief Complaint:   Routine Prenatal Visit and High Risk Gestation  History of Present Illness:   Theresa Rosario is a 35 y.o. G51P1011 female at [redacted]w[redacted]d with an Estimated Date of Delivery: 08/07/20 being seen today for ongoing management of a high-risk pregnancy complicated by chronic hypertension currently on Labetalol 100mg  qhs.  Today she reports no complaints.  Depression screen [facility] 2/9 05/11/2020 01/24/2020 01/04/2020 07/07/2019 02/09/2019  Decreased Interest 0 0 0 0 0  Down, Depressed, Hopeless 0 0 0 0 0  PHQ - 2 Score 0 0 0 0 0  Altered sleeping 2 0 - - -  Tired, decreased energy 1 2 - - -  Change in appetite 0 0 - - -  Feeling bad or failure about yourself  0 0 - - -  Trouble concentrating 0 0 - - -  Moving slowly or fidgety/restless 0 0 - - -  Suicidal thoughts 0 0 - - -  PHQ-9 Score 3 2 - - -    Contractions: Not present. Vag. Bleeding: None.  Movement: Present. denies leaking of fluid.  Review of Systems:   Pertinent items are noted in HPI Denies abnormal vaginal discharge w/ itching/odor/irritation, headaches, visual changes, shortness of breath, chest pain, abdominal pain, severe nausea/vomiting, or problems with urination or bowel movements unless otherwise stated above. Pertinent History Reviewed:  Reviewed past medical,surgical, social, obstetrical and family history.  Reviewed problem list, medications and allergies. Physical Assessment:   Vitals:   05/11/20 0843  BP: 110/60  Pulse: 96  Weight: 243 lb (110.2 kg)  Body mass index is 45.91 kg/m.           Physical Examination:   General appearance: alert, well appearing, and in no distress  Mental status: alert, oriented to person, place, and time  Skin: warm & dry   Extremities: Edema: None    Cardiovascular: normal heart rate noted  Respiratory: normal respiratory effort, no distress  Abdomen: gravid,  soft, non-tender  Pelvic: Cervical exam deferred         Fetal Status: Fetal Heart Rate (bpm): 155 Fundal Height: 28 cm Movement: Present    Fetal Surveillance Testing today: doppler   Chaperone: N/A    Results for orders placed or performed in visit on 05/11/20 (from the past 24 hour(s))  POC Urinalysis Dipstick OB   Collection Time: 05/11/20  8:43 AM  Result Value Ref Range   Color, UA     Clarity, UA     Glucose, UA Negative Negative   Bilirubin, UA     Ketones, UA neg    Spec Grav, UA     Blood, UA neg    pH, UA     POC,PROTEIN,UA Negative Negative, Trace, Small (1+), Moderate (2+), Large (3+), 4+   Urobilinogen, UA     Nitrite, UA neg    Leukocytes, UA Negative Negative   Appearance     Odor      Assessment & Plan:  High-risk pregnancy: G3P1011 at [redacted]w[redacted]d with an Estimated Date of Delivery: 08/07/20   1) CHTN, stable on Labetalol 100mg  qhs  2) H/O T2DM, early GTT normal, repeating today  Meds: No orders of the defined types were placed in this encounter.   Labs/procedures today: tdap and PN2  Treatment Plan:  efw asap, then q4wks    2x/wk testing nst/sono @ 32wks     Deliver 38-39wks (37wks or  prn if poor control)  Reviewed: Preterm labor symptoms and general obstetric precautions including but not limited to vaginal bleeding, contractions, leaking of fluid and fetal movement were reviewed in detail with the patient.  All questions were answered. Has home bp cuff.  Check bp weekly, let us know if >140/90.   Follow-up: Return for asap efw u/s (no visit), then 4wks HROB w/ MD/CNM w/ EFW u/s.   Future Appointments  Date Time Provider Department Center  06/07/2020  1:50 PM Cheral Marker, CNM [facility]  07/04/2020  2:00 PM [name], MD [facility]    Orders Placed This Encounter  Procedures  . US OB Follow Up  . Tdap vaccine greater than or equal to 7yo IM  . POC Urinalysis Dipstick OB   Cheral Marker CNM, [facility] 05/11/2020 9:23 AM

## 2020-05-11 NOTE — Patient Instructions (Signed)
Theresa Rosario, I greatly value your feedback.  If you receive a survey following your visit with [name] today, we appreciate you taking the time to fill it out.  Thanks, [name], CNM, WHNP-BC   [facility] ([address] [place], [place] [address]) Entrance C, located off of [address] valet parking  Go to [email or web address] to register for FREE online childbirth classes   Call the office [phone]) or go to [facility] if:  You begin to have strong, frequent contractions  Your water breaks.  Sometimes it is a big gush of fluid, sometimes it is just a trickle that keeps getting your panties wet or running down your legs  You have vaginal bleeding.  It is normal to have a small amount of spotting if your cervix was checked.   You don't feel your baby moving like normal.  If you don't, get you something to eat and drink and lay down and focus on feeling your baby move.  You should feel at least 10 movements in 2 hours.  If you don't, you should call the office or go to [facility].    Tdap Vaccine  It is recommended that you get the Tdap vaccine during the third trimester of EACH pregnancy to help protect your baby from getting pertussis (whooping cough)  27-36 weeks is the BEST time to do this so that you can pass the protection on to your baby. During pregnancy is better than after pregnancy, but if you are unable to get it during pregnancy it will be offered at the hospital.   You can get this vaccine with [name], at the health department, your family doctor, or some local pharmacies  Everyone who will be around your baby should also be up-to-date on their vaccines before the baby comes. Adults (who are not pregnant) only need 1 dose of Tdap during adulthood.   [place] Pediatricians/Family Doctors:  [facility] [phone]            [facility] [phone]                 [facility] [phone] (usually not accepting new patients unless you have family there already, you are always welcome to call and ask)       [facility] Department [phone]       [facility] Pediatricians/Family Doctors:   [facility]: [phone]  [facility]: [phone]  [facility]: [phone]  [facility] Doctors:   [facility]: [phone]   [facility]: [phone]  [facility] Doctors:  [facility]: [phone]   Home Blood Pressure Monitoring for Patients   Your provider has recommended that you check your blood pressure (BP) at least once a week at home. If you do not have a blood pressure cuff at home, one will be provided for you. Contact your provider if you have not received your monitor within 1 week.   Helpful Tips for Accurate Home Blood Pressure Checks  . Don't smoke, exercise, or drink caffeine 30 minutes before checking your BP . Use the restroom before checking your BP (a full bladder can raise your pressure) . Relax in a comfortable upright chair . Feet on the ground . Left arm resting comfortably on a flat surface at the level of your heart . Legs uncrossed . Back supported . Sit quietly and don't talk . Place the cuff on your  bare arm . Adjust snuggly, so that only two fingertips can fit between your skin and the top of the cuff . Check 2 readings separated by at least one minute . Keep a log of your BP readings . For a visual, please reference this diagram: http://ccnc.care/bpdiagram  Provider Name: [facility]     Phone: [phone]  Zone 1: ALL CLEAR  Continue to monitor your symptoms:  . BP reading is less than 140 (top number) or less than 90 (bottom number)  . No right upper stomach pain . No headaches or seeing spots . No feeling nauseated or throwing up . No swelling in face and hands  Zone 2: CAUTION Call  your doctor's office for any of the following:  . BP reading is greater than 140 (top number) or greater than 90 (bottom number)  . Stomach pain under your ribs in the middle or right side . Headaches or seeing spots . Feeling nauseated or throwing up . Swelling in face and hands  Zone 3: EMERGENCY  Seek immediate medical care if you have any of the following:  . BP reading is greater than160 (top number) or greater than 110 (bottom number) . Severe headaches not improving with Tylenol . Serious difficulty catching your breath . Any worsening symptoms from Zone 2   Third Trimester of Pregnancy The third trimester is from week 29 through week 42, months 7 through 9. The third trimester is a time when the fetus is growing rapidly. At the end of the ninth month, the fetus is about 20 inches in length and weighs 6-10 pounds.  BODY CHANGES Your body goes through many changes during pregnancy. The changes vary from woman to woman.   Your weight will continue to increase. You can expect to gain 25-35 pounds (11-16 kg) by the end of the pregnancy.  You may begin to get stretch marks on your hips, abdomen, and breasts.  You may urinate more often because the fetus is moving lower into your pelvis and pressing on your bladder.  You may develop or continue to have heartburn as a result of your pregnancy.  You may develop constipation because certain hormones are causing the muscles that push waste through your intestines to slow down.  You may develop hemorrhoids or swollen, bulging veins (varicose veins).  You may have pelvic pain because of the weight gain and pregnancy hormones relaxing your joints between the bones in your pelvis. Backaches may result from overexertion of the muscles supporting your posture.  You may have changes in your hair. These can include thickening of your hair, rapid growth, and changes in texture. Some women also have hair loss during or after pregnancy, or hair  that feels dry or thin. Your hair will most likely return to normal after your baby is born.  Your breasts will continue to grow and be tender. A yellow discharge may leak from your breasts called colostrum.  Your belly button may stick out.  You may feel short of breath because of your expanding uterus.  You may notice the fetus "dropping," or moving lower in your abdomen.  You may have a bloody mucus discharge. This usually occurs a few days to a week before labor begins.  Your cervix becomes thin and soft (effaced) near your due date. WHAT TO EXPECT AT YOUR PRENATAL EXAMS  You will have prenatal exams every 2 weeks until week 36. Then, you will have weekly prenatal exams. During a routine prenatal visit:  You will be weighed to make sure you and the fetus are growing normally.  Your blood pressure is taken.  Your abdomen will be measured to track your baby's growth.  The fetal heartbeat will be listened to.  Any test results from the previous visit will be discussed.  You may have a cervical check near your due date to see if you have effaced. At around 36 weeks, your caregiver will check your cervix. At the same time, your caregiver will also perform a test on the secretions of the vaginal tissue. This test is to determine if a type of bacteria, Group B streptococcus, is present. Your caregiver will explain this further. Your caregiver may ask you:  What your birth plan is.  How you are feeling.  If you are feeling the baby move.  If you have had any abnormal symptoms, such as leaking fluid, bleeding, severe headaches, or abdominal cramping.  If you have any questions. Other tests or screenings that may be performed during your third trimester include:  Blood tests that check for low iron levels (anemia).  Fetal testing to check the health, activity level, and growth of the fetus. Testing is done if you have certain medical conditions or if there are problems during the  pregnancy. FALSE LABOR You may feel small, irregular contractions that eventually go away. These are called Braxton Hicks contractions, or false labor. Contractions may last for hours, days, or even weeks before true labor sets in. If contractions come at regular intervals, intensify, or become painful, it is best to be seen by your caregiver.  SIGNS OF LABOR   Menstrual-like cramps.  Contractions that are 5 minutes apart or less.  Contractions that start on the top of the uterus and spread down to the lower abdomen and back.  A sense of increased pelvic pressure or back pain.  A watery or bloody mucus discharge that comes from the vagina. If you have any of these signs before the 37th week of pregnancy, call your caregiver right away. You need to go to the hospital to get checked immediately. HOME CARE INSTRUCTIONS   Avoid all smoking, herbs, alcohol, and unprescribed drugs. These chemicals affect the formation and growth of the baby.  Follow your caregiver's instructions regarding medicine use. There are medicines that are either safe or unsafe to take during pregnancy.  Exercise only as directed by your caregiver. Experiencing uterine cramps is a good sign to stop exercising.  Continue to eat regular, healthy meals.  Wear a good support bra for breast tenderness.  Do not use hot tubs, steam rooms, or saunas.  Wear your seat belt at all times when driving.  Avoid raw meat, uncooked cheese, cat litter boxes, and soil used by cats. These carry germs that can cause birth defects in the baby.  Take your prenatal vitamins.  Try taking a stool softener (if your caregiver approves) if you develop constipation. Eat more high-fiber foods, such as fresh vegetables or fruit and whole grains. Drink plenty of fluids to keep your urine clear or pale yellow.  Take warm sitz baths to soothe any pain or discomfort caused by hemorrhoids. Use hemorrhoid cream if your caregiver approves.  If you  develop varicose veins, wear support hose. Elevate your feet for 15 minutes, 3-4 times a day. Limit salt in your diet.  Avoid heavy lifting, wear low heal shoes, and practice good posture.  Rest a lot with your legs elevated if you have leg cramps or low  back pain.  Visit your dentist if you have not gone during your pregnancy. Use a soft toothbrush to brush your teeth and be gentle when you floss.  A sexual relationship may be continued unless your caregiver directs you otherwise.  Do not travel far distances unless it is absolutely necessary and only with the approval of your caregiver.  Take prenatal classes to understand, practice, and ask questions about the labor and delivery.  Make a trial run to the hospital.  Pack your hospital bag.  Prepare the baby's nursery.  Continue to go to all your prenatal visits as directed by your caregiver. SEEK MEDICAL CARE IF:  You are unsure if you are in labor or if your water has broken.  You have dizziness.  You have mild pelvic cramps, pelvic pressure, or nagging pain in your abdominal area.  You have persistent nausea, vomiting, or diarrhea.  You have a bad smelling vaginal discharge.  You have pain with urination. SEEK IMMEDIATE MEDICAL CARE IF:   You have a fever.  You are leaking fluid from your vagina.  You have spotting or bleeding from your vagina.  You have severe abdominal cramping or pain.  You have rapid weight loss or gain.  You have shortness of breath with chest pain.  You notice sudden or extreme swelling of your face, hands, ankles, feet, or legs.  You have not felt your baby move in over an hour.  You have severe headaches that do not go away with medicine.  You have vision changes. Document Released: 03/04/2001 Document Revised: 03/15/2013 Document Reviewed: 05/11/2012 [facility] Patient Information 2015 [place], [place]. This information is not intended to replace advice given to you by your health  care provider. Make sure you discuss any questions you have with your health care provider.

## 2020-05-12 LAB — CBC
Hematocrit: 36.9 % (ref 34.0–46.6)
Hemoglobin: 11.2 g/dL (ref 11.1–15.9)
MCH: 21 pg — ABNORMAL LOW (ref 26.6–33.0)
MCHC: 30.4 g/dL — ABNORMAL LOW (ref 31.5–35.7)
MCV: 69 fL — ABNORMAL LOW (ref 79–97)
Platelets: 392 10*3/uL (ref 150–450)
RBC: 5.33 x10E6/uL — ABNORMAL HIGH (ref 3.77–5.28)
RDW: 23.4 % — ABNORMAL HIGH (ref 11.7–15.4)
WBC: 8.8 10*3/uL (ref 3.4–10.8)

## 2020-05-12 LAB — RPR: RPR Ser Ql: NONREACTIVE

## 2020-05-12 LAB — HIV ANTIBODY (ROUTINE TESTING W REFLEX): HIV Screen 4th Generation wRfx: NONREACTIVE

## 2020-05-12 LAB — ANTIBODY SCREEN: Antibody Screen: NEGATIVE

## 2020-05-12 LAB — GLUCOSE TOLERANCE, 2 HOURS W/ 1HR
Glucose, 1 hour: 135 mg/dL (ref 65–179)
Glucose, 2 hour: 111 mg/dL (ref 65–152)
Glucose, Fasting: 78 mg/dL (ref 65–91)

## 2020-05-14 ENCOUNTER — Other Ambulatory Visit: Payer: Medicaid Other

## 2020-05-23 ENCOUNTER — Ambulatory Visit (INDEPENDENT_AMBULATORY_CARE_PROVIDER_SITE_OTHER): Payer: Medicaid Other

## 2020-05-23 ENCOUNTER — Other Ambulatory Visit (INDEPENDENT_AMBULATORY_CARE_PROVIDER_SITE_OTHER): Payer: Medicaid Other

## 2020-05-23 ENCOUNTER — Other Ambulatory Visit: Payer: Self-pay

## 2020-05-23 ENCOUNTER — Other Ambulatory Visit (HOSPITAL_COMMUNITY)
Admission: RE | Admit: 2020-05-23 | Discharge: 2020-05-23 | Disposition: A | Discharge status: Home / Self Care | Payer: Medicaid Other | Source: Ambulatory Visit | Attending: Obstetrics & Gynecology | Admitting: Obstetrics & Gynecology

## 2020-05-23 DIAGNOSIS — N898 Other specified noninflammatory disorders of vagina: Secondary | ICD-10-CM | POA: Insufficient documentation

## 2020-05-23 DIAGNOSIS — I10 Essential (primary) hypertension: Secondary | ICD-10-CM

## 2020-05-23 DIAGNOSIS — Z3A29 29 weeks gestation of pregnancy: Secondary | ICD-10-CM | POA: Diagnosis not present

## 2020-05-23 DIAGNOSIS — O0992 Supervision of high risk pregnancy, unspecified, second trimester: Secondary | ICD-10-CM | POA: Diagnosis not present

## 2020-05-23 NOTE — Progress Notes (Addendum)
   NURSE VISIT- VAGINITIS/STD/POC  SUBJECTIVE:  Theresa Rosario is a 35 y.o. G3P1011 [redacted]w[redacted]d pregnantfemale here for a vaginal swab for vaginitis screening, STD screen.  She reports the following symptoms: discharge described as white and creamy for 12 days. Denies abnormal vaginal bleeding, significant pelvic pain, fever, or UTI symptoms.  OBJECTIVE:  LMP  (LMP Unknown)   Appears well, in no apparent distress  ASSESSMENT: Vaginal swab for vaginitis screening  PLAN: Self-collected vaginal probe for Gonorrhea, Chlamydia, Trichomonas, Bacterial Vaginosis, Yeast sent to lab Treatment: to be determined once results are received Follow-up as needed if symptoms persist/worsen, or new symptoms develop  Chapel Silverthorn A Renee Beale  05/23/2020 4:13 PM   Chart reviewed for nurse visit. Agree with plan of care.  Cheral Marker, [place] 05/23/2020 4:30 PM

## 2020-05-23 NOTE — Progress Notes (Signed)
[name] 29+1 wks,cephalic,cx 4.5 cm,posterior placenta gr 0,AFI 18 cm,fhr 169 bpm,EFW 1422 g 54%

## 2020-05-25 LAB — CERVICOVAGINAL ANCILLARY ONLY
Bacterial Vaginitis (gardnerella): NEGATIVE
Candida Glabrata: NEGATIVE
Candida Vaginitis: NEGATIVE
Chlamydia: NEGATIVE
Comment: NEGATIVE
Comment: NEGATIVE
Comment: NEGATIVE
Comment: NEGATIVE
Comment: NEGATIVE
Comment: NORMAL
Neisseria Gonorrhea: NEGATIVE
Trichomonas: POSITIVE — AB

## 2020-05-28 ENCOUNTER — Other Ambulatory Visit: Payer: Self-pay | Admitting: Women's Health

## 2020-05-28 DIAGNOSIS — A599 Trichomoniasis, unspecified: Secondary | ICD-10-CM | POA: Insufficient documentation

## 2020-05-28 MED ORDER — METRONIDAZOLE 500 MG PO TABS: 2000.0000 mg | ORAL_TABLET | Freq: Once | ORAL | 0 refills | Status: AC

## 2020-05-29 ENCOUNTER — Telehealth: Payer: Self-pay | Admitting: *Deleted

## 2020-05-29 NOTE — Telephone Encounter (Signed)
-----   Message from Cheral Marker, [place] sent at 05/29/2020  1:03 PM EST ----- Hasn't read [employer]. Please call and read it to her/have her read it. Thanks

## 2020-05-29 NOTE — Telephone Encounter (Signed)
Pt aware her swab is + for trich. She needs to go by pharmacy and pick up med and take med until all gone. Partner needs to be treated. She states he has no insurance so he will go to health dept for treatment. Pt was advised no sex until at least 7 days from when both have finished med. No alcohol while on this med. Pt voiced understanding and call was transferred to [facility] for POC appt in 4 weeks with nurse. JSY

## 2020-06-07 ENCOUNTER — Encounter: Payer: Self-pay | Admitting: Women's Health

## 2020-06-07 ENCOUNTER — Ambulatory Visit (INDEPENDENT_AMBULATORY_CARE_PROVIDER_SITE_OTHER): Payer: Medicaid Other | Admitting: Women's Health

## 2020-06-07 ENCOUNTER — Other Ambulatory Visit: Payer: Self-pay

## 2020-06-07 ENCOUNTER — Encounter: Payer: Medicaid Other | Admitting: Women's Health

## 2020-06-07 VITALS — BP 124/68 | HR 100 | Wt 240.6 lb

## 2020-06-07 DIAGNOSIS — O0992 Supervision of high risk pregnancy, unspecified, second trimester: Secondary | ICD-10-CM

## 2020-06-07 DIAGNOSIS — I10 Essential (primary) hypertension: Secondary | ICD-10-CM

## 2020-06-07 DIAGNOSIS — O0993 Supervision of high risk pregnancy, unspecified, third trimester: Secondary | ICD-10-CM

## 2020-06-07 NOTE — Progress Notes (Signed)
HIGH-RISK PREGNANCY VISIT Patient name: Theresa Rosario MRN [id number]  Date of birth: 12/31/1985 Chief Complaint:   Routine Prenatal Visit  History of Present Illness:   Theresa Rosario is a 35 y.o. G14P1011 female at [redacted]w[redacted]d with an Estimated Date of Delivery: 08/07/20 being seen today for ongoing management of a high-risk pregnancy complicated by chronic hypertension currently on Labetalol 100mg  BID.  Today she reports no complaints.  Depression screen [facility] 2/9 05/11/2020 01/24/2020 01/04/2020 07/07/2019 02/09/2019  Decreased Interest 0 0 0 0 0  Down, Depressed, Hopeless 0 0 0 0 0  PHQ - 2 Score 0 0 0 0 0  Altered sleeping 2 0 - - -  Tired, decreased energy 1 2 - - -  Change in appetite 0 0 - - -  Feeling bad or failure about yourself  0 0 - - -  Trouble concentrating 0 0 - - -  Moving slowly or fidgety/restless 0 0 - - -  Suicidal thoughts 0 0 - - -  PHQ-9 Score 3 2 - - -    Contractions: Not present. Vag. Bleeding: None.  Movement: Present. denies leaking of fluid.  Review of Systems:   Pertinent items are noted in HPI Denies abnormal vaginal discharge w/ itching/odor/irritation, headaches, visual changes, shortness of breath, chest pain, abdominal pain, severe nausea/vomiting, or problems with urination or bowel movements unless otherwise stated above. Pertinent History Reviewed:  Reviewed past medical,surgical, social, obstetrical and family history.  Reviewed problem list, medications and allergies. Physical Assessment:   Vitals:   06/07/20 1338  BP: 124/68  Pulse: 100  Weight: 240 lb 9.6 oz (109.1 kg)  Body mass index is 45.46 kg/m.           Physical Examination:   General appearance: alert, well appearing, and in no distress  Mental status: alert, oriented to person, place, and time  Skin: warm & dry   Extremities: Edema: None    Cardiovascular: normal heart rate noted  Respiratory: normal respiratory effort, no distress  Abdomen: gravid, soft,  non-tender  Pelvic: Cervical exam deferred         Fetal Status: Fetal Heart Rate (bpm): 137 Fundal Height: 31 cm Movement: Present    Fetal Surveillance Testing today: doppler   Chaperone: N/A    No results found for this or any previous visit (from the past 24 hour(s)).  Assessment & Plan:  High-risk pregnancy: G3P1011 at [redacted]w[redacted]d with an Estimated Date of Delivery: 08/07/20   1) CHTN, stable, on Labetalol 100mg  BID, ASA  2) Recent +trichomonas, tx 3/7, POC 4wks  Meds: No orders of the defined types were placed in this encounter.   Labs/procedures today: none  Treatment Plan:  Growth u/s q4wks    2x/wk testing nst/sono @ 32wks     Deliver 38-39wks (37wks or prn if poor control)____   Reviewed: Preterm labor symptoms and general obstetric precautions including but not limited to vaginal bleeding, contractions, leaking of fluid and fetal movement were reviewed in detail with the patient.  All questions were answered. Has home bp cuff. Check bp weekly, let know if >140/90.   Follow-up: Return for cancel 4/5 nurse visit.  Tues nst/nurse, then Fri bpp/dopp & hrob w/ md/cnm until 5/3.   Future Appointments  Date Time Provider Department Center  06/15/2020 10:15 AM [facility] 06/17/2020 [facility] None  06/15/2020 11:10 AM [name], MD [facility]  06/22/2020 10:30 AM [facility] Lazaro Arms [facility] None  06/22/2020 11:50  AM Lazaro Arms, MD [facility]  07/04/2020  2:00 PM [name], MD [facility]    No orders of the defined types were placed in this encounter.  Cheral Marker CNM, [facility] 06/07/2020 3:15 PM

## 2020-06-07 NOTE — Patient Instructions (Signed)
Theresa Rosario, I greatly value your feedback.  If you receive a survey following your visit with [name] today, we appreciate you taking the time to fill it out.  Thanks, [name], CNM, WHNP-BC   [facility] ([address] [place], [place] [address]) Entrance C, located off of [address] valet parking  Go to [email or web address] to register for FREE online childbirth classes   Call the office [phone]) or go to [facility] if:  You begin to have strong, frequent contractions  Your water breaks.  Sometimes it is a big gush of fluid, sometimes it is just a trickle that keeps getting your panties wet or running down your legs  You have vaginal bleeding.  It is normal to have a small amount of spotting if your cervix was checked.   You don't feel your baby moving like normal.  If you don't, get you something to eat and drink and lay down and focus on feeling your baby move.  You should feel at least 10 movements in 2 hours.  If you don't, you should call the office or go to [facility].    Tdap Vaccine  It is recommended that you get the Tdap vaccine during the third trimester of EACH pregnancy to help protect your baby from getting pertussis (whooping cough)  27-36 weeks is the BEST time to do this so that you can pass the protection on to your baby. During pregnancy is better than after pregnancy, but if you are unable to get it during pregnancy it will be offered at the hospital.   You can get this vaccine with [name], at the health department, your family doctor, or some local pharmacies  Everyone who will be around your baby should also be up-to-date on their vaccines before the baby comes. Adults (who are not pregnant) only need 1 dose of Tdap during adulthood.   [place] Pediatricians/Family Doctors:  [facility] [phone]            [facility] [phone]                 [facility] [phone] (usually not accepting new patients unless you have family there already, you are always welcome to call and ask)       [facility] Department [phone]       [facility] Pediatricians/Family Doctors:   [facility]: [phone]  [facility]: [phone]  [facility]: [phone]  [facility] Doctors:   [facility]: [phone]   [facility]: [phone]  [facility] Doctors:  [facility]: [phone]   Home Blood Pressure Monitoring for Patients   Your provider has recommended that you check your blood pressure (BP) at least once a week at home. If you do not have a blood pressure cuff at home, one will be provided for you. Contact your provider if you have not received your monitor within 1 week.   Helpful Tips for Accurate Home Blood Pressure Checks  . Don't smoke, exercise, or drink caffeine 30 minutes before checking your BP . Use the restroom before checking your BP (a full bladder can raise your pressure) . Relax in a comfortable upright chair . Feet on the ground . Left arm resting comfortably on a flat surface at the level of your heart . Legs uncrossed . Back supported . Sit quietly and don't talk . Place the cuff on your  bare arm . Adjust snuggly, so that only two fingertips can fit between your skin and the top of the cuff . Check 2 readings separated by at least one minute . Keep a log of your BP readings . For a visual, please reference this diagram: http://ccnc.care/bpdiagram  Provider Name: [facility]     Phone: [phone]  Zone 1: ALL CLEAR  Continue to monitor your symptoms:  . BP reading is less than 140 (top number) or less than 90 (bottom number)  . No right upper stomach pain . No headaches or seeing spots . No feeling nauseated or throwing up . No swelling in face and hands  Zone 2: CAUTION Call  your doctor's office for any of the following:  . BP reading is greater than 140 (top number) or greater than 90 (bottom number)  . Stomach pain under your ribs in the middle or right side . Headaches or seeing spots . Feeling nauseated or throwing up . Swelling in face and hands  Zone 3: EMERGENCY  Seek immediate medical care if you have any of the following:  . BP reading is greater than160 (top number) or greater than 110 (bottom number) . Severe headaches not improving with Tylenol . Serious difficulty catching your breath . Any worsening symptoms from Zone 2   Third Trimester of Pregnancy The third trimester is from week 29 through week 42, months 7 through 9. The third trimester is a time when the fetus is growing rapidly. At the end of the ninth month, the fetus is about 20 inches in length and weighs 6-10 pounds.  BODY CHANGES Your body goes through many changes during pregnancy. The changes vary from woman to woman.   Your weight will continue to increase. You can expect to gain 25-35 pounds (11-16 kg) by the end of the pregnancy.  You may begin to get stretch marks on your hips, abdomen, and breasts.  You may urinate more often because the fetus is moving lower into your pelvis and pressing on your bladder.  You may develop or continue to have heartburn as a result of your pregnancy.  You may develop constipation because certain hormones are causing the muscles that push waste through your intestines to slow down.  You may develop hemorrhoids or swollen, bulging veins (varicose veins).  You may have pelvic pain because of the weight gain and pregnancy hormones relaxing your joints between the bones in your pelvis. Backaches may result from overexertion of the muscles supporting your posture.  You may have changes in your hair. These can include thickening of your hair, rapid growth, and changes in texture. Some women also have hair loss during or after pregnancy, or hair  that feels dry or thin. Your hair will most likely return to normal after your baby is born.  Your breasts will continue to grow and be tender. A yellow discharge may leak from your breasts called colostrum.  Your belly button may stick out.  You may feel short of breath because of your expanding uterus.  You may notice the fetus "dropping," or moving lower in your abdomen.  You may have a bloody mucus discharge. This usually occurs a few days to a week before labor begins.  Your cervix becomes thin and soft (effaced) near your due date. WHAT TO EXPECT AT YOUR PRENATAL EXAMS  You will have prenatal exams every 2 weeks until week 36. Then, you will have weekly prenatal exams. During a routine prenatal visit:  You will be weighed to make sure you and the fetus are growing normally.  Your blood pressure is taken.  Your abdomen will be measured to track your baby's growth.  The fetal heartbeat will be listened to.  Any test results from the previous visit will be discussed.  You may have a cervical check near your due date to see if you have effaced. At around 36 weeks, your caregiver will check your cervix. At the same time, your caregiver will also perform a test on the secretions of the vaginal tissue. This test is to determine if a type of bacteria, Group B streptococcus, is present. Your caregiver will explain this further. Your caregiver may ask you:  What your birth plan is.  How you are feeling.  If you are feeling the baby move.  If you have had any abnormal symptoms, such as leaking fluid, bleeding, severe headaches, or abdominal cramping.  If you have any questions. Other tests or screenings that may be performed during your third trimester include:  Blood tests that check for low iron levels (anemia).  Fetal testing to check the health, activity level, and growth of the fetus. Testing is done if you have certain medical conditions or if there are problems during the  pregnancy. FALSE LABOR You may feel small, irregular contractions that eventually go away. These are called Braxton Hicks contractions, or false labor. Contractions may last for hours, days, or even weeks before true labor sets in. If contractions come at regular intervals, intensify, or become painful, it is best to be seen by your caregiver.  SIGNS OF LABOR   Menstrual-like cramps.  Contractions that are 5 minutes apart or less.  Contractions that start on the top of the uterus and spread down to the lower abdomen and back.  A sense of increased pelvic pressure or back pain.  A watery or bloody mucus discharge that comes from the vagina. If you have any of these signs before the 37th week of pregnancy, call your caregiver right away. You need to go to the hospital to get checked immediately. HOME CARE INSTRUCTIONS   Avoid all smoking, herbs, alcohol, and unprescribed drugs. These chemicals affect the formation and growth of the baby.  Follow your caregiver's instructions regarding medicine use. There are medicines that are either safe or unsafe to take during pregnancy.  Exercise only as directed by your caregiver. Experiencing uterine cramps is a good sign to stop exercising.  Continue to eat regular, healthy meals.  Wear a good support bra for breast tenderness.  Do not use hot tubs, steam rooms, or saunas.  Wear your seat belt at all times when driving.  Avoid raw meat, uncooked cheese, cat litter boxes, and soil used by cats. These carry germs that can cause birth defects in the baby.  Take your prenatal vitamins.  Try taking a stool softener (if your caregiver approves) if you develop constipation. Eat more high-fiber foods, such as fresh vegetables or fruit and whole grains. Drink plenty of fluids to keep your urine clear or pale yellow.  Take warm sitz baths to soothe any pain or discomfort caused by hemorrhoids. Use hemorrhoid cream if your caregiver approves.  If you  develop varicose veins, wear support hose. Elevate your feet for 15 minutes, 3-4 times a day. Limit salt in your diet.  Avoid heavy lifting, wear low heal shoes, and practice good posture.  Rest a lot with your legs elevated if you have leg cramps or low  back pain.  Visit your dentist if you have not gone during your pregnancy. Use a soft toothbrush to brush your teeth and be gentle when you floss.  A sexual relationship may be continued unless your caregiver directs you otherwise.  Do not travel far distances unless it is absolutely necessary and only with the approval of your caregiver.  Take prenatal classes to understand, practice, and ask questions about the labor and delivery.  Make a trial run to the hospital.  Pack your hospital bag.  Prepare the baby's nursery.  Continue to go to all your prenatal visits as directed by your caregiver. SEEK MEDICAL CARE IF:  You are unsure if you are in labor or if your water has broken.  You have dizziness.  You have mild pelvic cramps, pelvic pressure, or nagging pain in your abdominal area.  You have persistent nausea, vomiting, or diarrhea.  You have a bad smelling vaginal discharge.  You have pain with urination. SEEK IMMEDIATE MEDICAL CARE IF:   You have a fever.  You are leaking fluid from your vagina.  You have spotting or bleeding from your vagina.  You have severe abdominal cramping or pain.  You have rapid weight loss or gain.  You have shortness of breath with chest pain.  You notice sudden or extreme swelling of your face, hands, ankles, feet, or legs.  You have not felt your baby move in over an hour.  You have severe headaches that do not go away with medicine.  You have vision changes. Document Released: 03/04/2001 Document Revised: 03/15/2013 Document Reviewed: 05/11/2012 [facility] Patient Information 2015 [place], [place]. This information is not intended to replace advice given to you by your health  care provider. Make sure you discuss any questions you have with your health care provider.

## 2020-06-12 ENCOUNTER — Other Ambulatory Visit: Payer: Self-pay

## 2020-06-12 ENCOUNTER — Ambulatory Visit (INDEPENDENT_AMBULATORY_CARE_PROVIDER_SITE_OTHER): Payer: Medicaid Other | Admitting: *Deleted

## 2020-06-12 ENCOUNTER — Other Ambulatory Visit: Payer: Medicaid Other

## 2020-06-12 ENCOUNTER — Other Ambulatory Visit: Payer: Self-pay | Admitting: Obstetrics & Gynecology

## 2020-06-12 ENCOUNTER — Other Ambulatory Visit (INDEPENDENT_AMBULATORY_CARE_PROVIDER_SITE_OTHER): Payer: Medicaid Other

## 2020-06-12 VITALS — BP 129/81 | HR 91 | Wt 242.0 lb

## 2020-06-12 DIAGNOSIS — O288 Other abnormal findings on antenatal screening of mother: Secondary | ICD-10-CM | POA: Diagnosis not present

## 2020-06-12 DIAGNOSIS — I10 Essential (primary) hypertension: Secondary | ICD-10-CM

## 2020-06-12 DIAGNOSIS — M542 Cervicalgia: Secondary | ICD-10-CM | POA: Diagnosis not present

## 2020-06-12 DIAGNOSIS — M545 Low back pain, unspecified: Secondary | ICD-10-CM | POA: Diagnosis not present

## 2020-06-12 DIAGNOSIS — M25559 Pain in unspecified hip: Secondary | ICD-10-CM | POA: Diagnosis not present

## 2020-06-12 DIAGNOSIS — G4733 Obstructive sleep apnea (adult) (pediatric): Secondary | ICD-10-CM | POA: Diagnosis not present

## 2020-06-12 DIAGNOSIS — F419 Anxiety disorder, unspecified: Secondary | ICD-10-CM | POA: Diagnosis not present

## 2020-06-12 DIAGNOSIS — O0993 Supervision of high risk pregnancy, unspecified, third trimester: Secondary | ICD-10-CM | POA: Diagnosis not present

## 2020-06-12 DIAGNOSIS — Z3A32 32 weeks gestation of pregnancy: Secondary | ICD-10-CM

## 2020-06-12 DIAGNOSIS — M797 Fibromyalgia: Secondary | ICD-10-CM | POA: Diagnosis not present

## 2020-06-12 NOTE — Progress Notes (Addendum)
   NURSE VISIT- NST  SUBJECTIVE:  Theresa Rosario is a 35 y.o. G53P1011 female at 110w0d, here for a NST for pregnancy complicated by [facility].  She reports active fetal movement, contractions: none, vaginal bleeding: none, membranes: intact.   OBJECTIVE:  BP 129/81   Pulse 91   Wt 242 lb (109.8 kg)   LMP  (LMP Unknown)   BMI 45.73 kg/m   Appears well, no apparent distress  No results found for this or any previous visit (from the past 24 hour(s)).  NST: FHR baseline 145 bpm, Variability: moderate, Accelerations:not present, Decelerations:  Absent-nonreactive Toco: none   ASSESSMENT: G3P1011 at [redacted]w[redacted]d with CHTN NST nonreactive  PLAN: EFM strip reviewed by Dr. Despina Hidden   Recommendations: work-in for BPP. BPP 8/8, pt to return as scheduled on Friday.    Jobe Marker  06/12/2020 4:57 PM   Attestation of Attending Supervision of Nursing Visit Encounter: Evaluation and management procedures were performed by the nursing staff under my supervision and collaboration.  I have reviewed the nurse's note and chart, and I agree with the management and plan.  Rockne Coons MD Attending Physician for the Center for [facility] 06/13/2020 8:13 AM

## 2020-06-12 NOTE — Progress Notes (Signed)
[name] 32 wks,cephalic,BPP 8/8,fhr 164 bpm,posterior placenta gr 2,RI .51,.55,.54,.61=24%,AFI 11.5 cm

## 2020-06-14 ENCOUNTER — Other Ambulatory Visit: Payer: Self-pay | Admitting: Women's Health

## 2020-06-14 DIAGNOSIS — O0993 Supervision of high risk pregnancy, unspecified, third trimester: Secondary | ICD-10-CM

## 2020-06-14 DIAGNOSIS — I10 Essential (primary) hypertension: Secondary | ICD-10-CM

## 2020-06-15 ENCOUNTER — Other Ambulatory Visit: Payer: Self-pay

## 2020-06-15 ENCOUNTER — Ambulatory Visit (INDEPENDENT_AMBULATORY_CARE_PROVIDER_SITE_OTHER): Payer: Medicaid Other

## 2020-06-15 ENCOUNTER — Ambulatory Visit (INDEPENDENT_AMBULATORY_CARE_PROVIDER_SITE_OTHER): Payer: Medicaid Other | Admitting: Obstetrics & Gynecology

## 2020-06-15 ENCOUNTER — Encounter: Payer: Self-pay | Admitting: Obstetrics & Gynecology

## 2020-06-15 VITALS — BP 120/88 | HR 90 | Wt 243.0 lb

## 2020-06-15 DIAGNOSIS — O0992 Supervision of high risk pregnancy, unspecified, second trimester: Secondary | ICD-10-CM | POA: Diagnosis not present

## 2020-06-15 DIAGNOSIS — Z3A32 32 weeks gestation of pregnancy: Secondary | ICD-10-CM

## 2020-06-15 DIAGNOSIS — Z029 Encounter for administrative examinations, unspecified: Secondary | ICD-10-CM

## 2020-06-15 DIAGNOSIS — O0993 Supervision of high risk pregnancy, unspecified, third trimester: Secondary | ICD-10-CM

## 2020-06-15 DIAGNOSIS — I10 Essential (primary) hypertension: Secondary | ICD-10-CM | POA: Diagnosis not present

## 2020-06-15 DIAGNOSIS — O10913 Unspecified pre-existing hypertension complicating pregnancy, third trimester: Secondary | ICD-10-CM

## 2020-06-15 LAB — POCT URINALYSIS DIPSTICK OB
Glucose, UA: NEGATIVE
Ketones, UA: NEGATIVE
Nitrite, UA: NEGATIVE
POC,PROTEIN,UA: NEGATIVE

## 2020-06-15 NOTE — Progress Notes (Signed)
HIGH-RISK PREGNANCY VISIT Patient name: Theresa Rosario MRN [id number]  Date of birth: 07/31/1985 Chief Complaint:   High Risk Gestation ([name] today)  History of Present Illness:   Theresa Rosario is a 35 y.o. G60P1011 female at [redacted]w[redacted]d with an Estimated Date of Delivery: 08/07/20 being seen today for ongoing management of a high-risk pregnancy complicated by chronic hypertension currently on labetalol 200 daily.  Today she reports no complaints.  Depression screen [facility] 2/9 05/11/2020 01/24/2020 01/04/2020 07/07/2019 02/09/2019  Decreased Interest 0 0 0 0 0  Down, Depressed, Hopeless 0 0 0 0 0  PHQ - 2 Score 0 0 0 0 0  Altered sleeping 2 0 - - -  Tired, decreased energy 1 2 - - -  Change in appetite 0 0 - - -  Feeling bad or failure about yourself  0 0 - - -  Trouble concentrating 0 0 - - -  Moving slowly or fidgety/restless 0 0 - - -  Suicidal thoughts 0 0 - - -  PHQ-9 Score 3 2 - - -    Contractions: Not present. Vag. Bleeding: None.  Movement: Present. denies leaking of fluid.  Review of Systems:   Pertinent items are noted in HPI Denies abnormal vaginal discharge w/ itching/odor/irritation, headaches, visual changes, shortness of breath, chest pain, abdominal pain, severe nausea/vomiting, or problems with urination or bowel movements unless otherwise stated above. Pertinent History Reviewed:  Reviewed past medical,surgical, social, obstetrical and family history.  Reviewed problem list, medications and allergies. Physical Assessment:   Vitals:   06/15/20 1116  BP: 120/88  Pulse: 90  Weight: 243 lb (110.2 kg)  Body mass index is 45.91 kg/m.           Physical Examination:   General appearance: alert, well appearing, and in no distress  Mental status: alert, oriented to person, place, and time  Skin: warm & dry   Extremities: Edema: None    Cardiovascular: normal heart rate noted  Respiratory: normal respiratory effort, no distress  Abdomen: gravid, soft,  non-tender  Pelvic: Cervical exam deferred         Fetal Status:     Movement: Present    Fetal Surveillance Testing today: BPP 8/8 with Dopplers 43%   Chaperone: N/A    Results for orders placed or performed in visit on 06/15/20 (from the past 24 hour(s))  POC Urinalysis Dipstick OB   Collection Time: 06/15/20 11:20 AM  Result Value Ref Range   Color, UA     Clarity, UA     Glucose, UA Negative Negative   Bilirubin, UA     Ketones, UA neg    Spec Grav, UA     Blood, UA trace    pH, UA     POC,PROTEIN,UA Negative Negative, Trace, Small (1+), Moderate (2+), Large (3+), 4+   Urobilinogen, UA     Nitrite, UA neg    Leukocytes, UA Trace (A) Negative   Appearance     Odor      Assessment & Plan:  High-risk pregnancy: G3P1011 at [redacted]w[redacted]d with an Estimated Date of Delivery: 08/07/20   1) CHTN on labetalol 200 mg daily, stable, reassuring surveillance    Meds: No orders of the defined types were placed in this encounter.   Labs/procedures today: U/S  Treatment Plan:  Continue twice weekly surveillance, IOL 39 weeks or as clinically indicated  Reviewed: Preterm labor symptoms and general obstetric precautions including but not limited to vaginal bleeding, contractions, leaking  of fluid and fetal movement were reviewed in detail with the patient.  All questions were answered. Has home bp cuff. Rx faxed to . Check bp weekly, let us know if >140/90.   Follow-up: Return in about 4 days (around 06/19/2020) for NST, Nurse only.   Future Appointments  Date Time Provider Department Center  06/19/2020 11:10 AM [facility] NURSE [facility]  06/22/2020 10:30 AM [facility] [name] [facility] None  06/22/2020 11:50 AM Lazaro Arms, MD [facility]  06/26/2020  2:30 PM [facility] NURSE [facility]  06/29/2020 12:00 PM [facility] [name] [facility] None  06/29/2020 12:50 PM Lazaro Arms, MD [facility]  07/03/2020  2:30 PM [facility] NURSE [facility]  07/04/2020  2:00 PM [name], MD [facility]  07/05/2020  9:00 AM [facility] [name] [facility] None  07/05/2020 10:10 AM Jacklyn Shell, CNM [facility]  07/10/2020  2:30 PM [facility] NURSE [facility]  07/13/2020 10:30 AM [facility] [name] [facility] None  07/13/2020 11:30 AM Myna Hidalgo, DO [facility]  07/17/2020  2:30 PM [facility] NURSE [facility]  07/20/2020  9:15 AM [facility] [name] [facility] None  07/20/2020 10:10 AM Cheral Marker, CNM [facility]  07/24/2020  3:10 PM [facility] NURSE [facility]    Orders Placed This Encounter  Procedures  . POC Urinalysis Dipstick OB   Lazaro Arms  06/15/2020 12:18 PM

## 2020-06-15 NOTE — Progress Notes (Signed)
[name] 32+3 wks,cephalic,BPP 8/8,RI .56,.59,.62,.58=43%,AFI 12 cm,EFW 2087 g 56%

## 2020-06-19 ENCOUNTER — Other Ambulatory Visit: Payer: Self-pay

## 2020-06-19 ENCOUNTER — Ambulatory Visit (INDEPENDENT_AMBULATORY_CARE_PROVIDER_SITE_OTHER): Payer: Medicaid Other | Admitting: *Deleted

## 2020-06-19 DIAGNOSIS — O0993 Supervision of high risk pregnancy, unspecified, third trimester: Secondary | ICD-10-CM

## 2020-06-19 DIAGNOSIS — I10 Essential (primary) hypertension: Secondary | ICD-10-CM | POA: Diagnosis not present

## 2020-06-19 NOTE — Progress Notes (Addendum)
   NURSE VISIT- NST  SUBJECTIVE:  Theresa Rosario is a 35 y.o. G64P1011 female at [redacted]w[redacted]d, here for a NST for pregnancy complicated by [facility].  She reports active fetal movement, contractions: none, vaginal bleeding: none, membranes: intact.   OBJECTIVE:  BP 126/67   Pulse 96   Wt 245 lb (111.1 kg)   LMP  (LMP Unknown)   BMI 46.29 kg/m   Appears well, no apparent distress  No results found for this or any previous visit (from the past 24 hour(s)).  NST: FHR baseline 140 bpm, Variability: moderate, Accelerations:present, Decelerations:  Absent= Cat 1/Reactive Toco: none   ASSESSMENT: G3P1011 at [redacted]w[redacted]d with CHTN NST reactive  PLAN: EFM strip reviewed by Dr. Despina Hidden   Recommendations: keep next appointment as scheduled    Jobe Marker  06/19/2020 12:01 PM  Attestation of Attending Supervision of Nursing Visit Encounter: Evaluation and management procedures were performed by the nursing staff under my supervision and collaboration.  I have reviewed the nurse's note and chart, and I agree with the management and plan.  Rockne Coons MD Attending Physician for the Center for [facility] 06/19/2020 12:19 PM

## 2020-06-21 ENCOUNTER — Other Ambulatory Visit: Payer: Self-pay | Admitting: Obstetrics & Gynecology

## 2020-06-21 DIAGNOSIS — O10919 Unspecified pre-existing hypertension complicating pregnancy, unspecified trimester: Secondary | ICD-10-CM

## 2020-06-22 ENCOUNTER — Other Ambulatory Visit: Payer: Self-pay

## 2020-06-22 ENCOUNTER — Ambulatory Visit (INDEPENDENT_AMBULATORY_CARE_PROVIDER_SITE_OTHER): Payer: Medicaid Other

## 2020-06-22 ENCOUNTER — Ambulatory Visit (INDEPENDENT_AMBULATORY_CARE_PROVIDER_SITE_OTHER): Payer: Medicaid Other | Admitting: Obstetrics & Gynecology

## 2020-06-22 ENCOUNTER — Encounter: Payer: Self-pay | Admitting: Obstetrics & Gynecology

## 2020-06-22 ENCOUNTER — Other Ambulatory Visit (HOSPITAL_COMMUNITY)
Admission: RE | Admit: 2020-06-22 | Discharge: 2020-06-22 | Disposition: A | Discharge status: Home / Self Care | Payer: Medicaid Other | Source: Ambulatory Visit | Attending: Obstetrics & Gynecology | Admitting: Obstetrics & Gynecology

## 2020-06-22 VITALS — BP 114/64 | HR 97 | Wt 244.0 lb

## 2020-06-22 DIAGNOSIS — O0993 Supervision of high risk pregnancy, unspecified, third trimester: Secondary | ICD-10-CM

## 2020-06-22 DIAGNOSIS — O10919 Unspecified pre-existing hypertension complicating pregnancy, unspecified trimester: Secondary | ICD-10-CM

## 2020-06-22 DIAGNOSIS — I10 Essential (primary) hypertension: Secondary | ICD-10-CM

## 2020-06-22 NOTE — Progress Notes (Signed)
[name] 33+3 wks,cephalic,fhr 144 bpm,posterior placenta gr 2,afi 11 cm,BPP 8/8,RI .56,.55,.52,.45=8.3%

## 2020-06-22 NOTE — Addendum Note (Signed)
Addended by: Annamarie Dawley on: 06/22/2020 12:44 PM   Modules accepted: Orders

## 2020-06-22 NOTE — Progress Notes (Signed)
HIGH-RISK PREGNANCY VISIT Patient name: Theresa Rosario MRN [id number]  Date of birth: April 16, 1985 Chief Complaint:   Routine Prenatal Visit  History of Present Illness:   Theresa Rosario is a 35 y.o. G32P1011 female at [redacted]w[redacted]d with an Estimated Date of Delivery: 08/07/20 being seen today for ongoing management of a high-risk pregnancy complicated by chronic hypertension currently on labetalol 200 daily.  Today she reports no complaints.  Depression screen [facility] 2/9 05/11/2020 01/24/2020 01/04/2020 07/07/2019 02/09/2019  Decreased Interest 0 0 0 0 0  Down, Depressed, Hopeless 0 0 0 0 0  PHQ - 2 Score 0 0 0 0 0  Altered sleeping 2 0 - - -  Tired, decreased energy 1 2 - - -  Change in appetite 0 0 - - -  Feeling bad or failure about yourself  0 0 - - -  Trouble concentrating 0 0 - - -  Moving slowly or fidgety/restless 0 0 - - -  Suicidal thoughts 0 0 - - -  PHQ-9 Score 3 2 - - -    Contractions: Not present. Vag. Bleeding: None.  Movement: Present. denies leaking of fluid.  Review of Systems:   Pertinent items are noted in HPI Denies abnormal vaginal discharge w/ itching/odor/irritation, headaches, visual changes, shortness of breath, chest pain, abdominal pain, severe nausea/vomiting, or problems with urination or bowel movements unless otherwise stated above. Pertinent History Reviewed:  Reviewed past medical,surgical, social, obstetrical and family history.  Reviewed problem list, medications and allergies. Physical Assessment:   Vitals:   06/22/20 1108  BP: 114/64  Pulse: 97  Weight: 244 lb (110.7 kg)  Body mass index is 46.1 kg/m.           Physical Examination:   General appearance: alert, well appearing, and in no distress  Mental status: alert, oriented to person, place, and time  Skin: warm & dry   Extremities: Edema: None    Cardiovascular: normal heart rate noted  Respiratory: normal respiratory effort, no distress  Abdomen: gravid, soft, non-tender  Pelvic:  Cervical exam deferred         Fetal Status:     Movement: Present    Fetal Surveillance Testing today: BPP 8/8, Dopplers 8.3%   Chaperone: Faith Rogue LPN    No results found for this or any previous visit (from the past 24 hour(s)).  Assessment & Plan:  High-risk pregnancy: G3P1011 at [redacted]w[redacted]d with an Estimated Date of Delivery: 08/07/20   1) CHTN, labetalo 200 daily, stable    Meds: No orders of the defined types were placed in this encounter.   Labs/procedures today: U/S  Treatment Plan:  Twice weekly testing, IOL 39 weeks or as clinically indicated  Reviewed: Preterm labor symptoms and general obstetric precautions including but not limited to vaginal bleeding, contractions, leaking of fluid and fetal movement were reviewed in detail with the patient.  All questions were answered. Does have home bp cuff. Office bp cuff given: not applicable. Check bp daily, let us know if consistently >150 and/or >95.  Follow-up: Return for keep scheduled.   Future Appointments  Date Time Provider Department Center  06/22/2020 11:50 AM Lazaro Arms, MD [facility]  06/26/2020  2:30 PM [facility] NURSE [facility]  06/29/2020 12:00 PM [facility] [name] [facility] None  06/29/2020 12:50 PM Lazaro Arms, MD [facility]  07/03/2020  2:30 PM [facility] NURSE [facility]  07/04/2020  2:00 PM [name], MD [facility]  07/05/2020  9:00 AM [facility] [name] [facility] None  07/05/2020 10:10 AM Jacklyn Shell, CNM [facility]  07/10/2020  2:30 PM [facility] NURSE [facility]  07/13/2020 10:30 AM [facility] [name] [facility] None  07/13/2020 11:30 AM Myna Hidalgo, DO [facility]  07/17/2020  2:30 PM [facility] NURSE [facility]  07/20/2020  9:15 AM [facility] [name] [facility] None  07/20/2020 10:10 AM Cheral Marker, CNM [facility]  07/24/2020  3:10 PM [facility] NURSE [facility]    No orders of the defined types were placed in this encounter.  Lazaro Arms  06/22/2020 11:40 AM

## 2020-06-24 LAB — CERVICOVAGINAL ANCILLARY ONLY
Comment: NEGATIVE
Trichomonas: NEGATIVE

## 2020-06-26 ENCOUNTER — Ambulatory Visit (INDEPENDENT_AMBULATORY_CARE_PROVIDER_SITE_OTHER): Payer: Medicaid Other | Admitting: *Deleted

## 2020-06-26 ENCOUNTER — Other Ambulatory Visit: Payer: Medicaid Other

## 2020-06-26 ENCOUNTER — Other Ambulatory Visit: Payer: Self-pay

## 2020-06-26 VITALS — BP 138/80 | HR 102 | Wt 245.0 lb

## 2020-06-26 DIAGNOSIS — O0993 Supervision of high risk pregnancy, unspecified, third trimester: Secondary | ICD-10-CM

## 2020-06-26 DIAGNOSIS — I10 Essential (primary) hypertension: Secondary | ICD-10-CM | POA: Diagnosis not present

## 2020-06-26 NOTE — Progress Notes (Addendum)
   NURSE VISIT- NST  SUBJECTIVE:  Theresa Rosario is a 35 y.o. G63P1011 female at [redacted]w[redacted]d, here for a NST for pregnancy complicated by [facility].  She reports active fetal movement, contractions: none, vaginal bleeding: none, membranes: intact.   OBJECTIVE:  BP 138/80   Pulse (!) 102   Wt 245 lb (111.1 kg)   LMP  (LMP Unknown)   BMI 46.29 kg/m   Appears well, no apparent distress  No results found for this or any previous visit (from the past 24 hour(s)).  NST: FHR baseline 150 bpm, Variability: moderate, Accelerations:present, Decelerations:  Absent= Cat 1/Reactive Toco: none   ASSESSMENT: G3P1011 at [redacted]w[redacted]d with CHTN NST reactive  PLAN: EFM strip reviewed by Dr. Despina Hidden   Recommendations: keep next appointment as scheduled    Jobe Marker  06/26/2020 3:50 PM  Attestation of Attending Supervision of Nursing Visit Encounter: Evaluation and management procedures were performed by the nursing staff under my supervision and collaboration.  I have reviewed the nurse's note and chart, and I agree with the management and plan.  Rockne Coons MD Attending Physician for the Center for [facility] 06/26/2020 5:17 PM

## 2020-06-28 ENCOUNTER — Other Ambulatory Visit: Payer: Self-pay | Admitting: Obstetrics & Gynecology

## 2020-06-28 DIAGNOSIS — Z20828 Contact with and (suspected) exposure to other viral communicable diseases: Secondary | ICD-10-CM | POA: Diagnosis not present

## 2020-06-28 DIAGNOSIS — O10919 Unspecified pre-existing hypertension complicating pregnancy, unspecified trimester: Secondary | ICD-10-CM

## 2020-06-29 ENCOUNTER — Ambulatory Visit (INDEPENDENT_AMBULATORY_CARE_PROVIDER_SITE_OTHER): Payer: Medicaid Other | Admitting: Obstetrics & Gynecology

## 2020-06-29 ENCOUNTER — Other Ambulatory Visit: Payer: Self-pay

## 2020-06-29 ENCOUNTER — Encounter: Payer: Self-pay | Admitting: Obstetrics & Gynecology

## 2020-06-29 ENCOUNTER — Ambulatory Visit (INDEPENDENT_AMBULATORY_CARE_PROVIDER_SITE_OTHER): Payer: Medicaid Other

## 2020-06-29 VITALS — BP 128/83 | HR 94 | Wt 244.0 lb

## 2020-06-29 DIAGNOSIS — O10919 Unspecified pre-existing hypertension complicating pregnancy, unspecified trimester: Secondary | ICD-10-CM

## 2020-06-29 DIAGNOSIS — A599 Trichomoniasis, unspecified: Secondary | ICD-10-CM

## 2020-06-29 DIAGNOSIS — Z1389 Encounter for screening for other disorder: Secondary | ICD-10-CM

## 2020-06-29 DIAGNOSIS — O0993 Supervision of high risk pregnancy, unspecified, third trimester: Secondary | ICD-10-CM

## 2020-06-29 DIAGNOSIS — Z3A34 34 weeks gestation of pregnancy: Secondary | ICD-10-CM

## 2020-06-29 LAB — POCT URINALYSIS DIPSTICK OB
Blood, UA: NEGATIVE
Glucose, UA: NEGATIVE
Ketones, UA: NEGATIVE
Leukocytes, UA: NEGATIVE
Nitrite, UA: NEGATIVE

## 2020-06-29 NOTE — Progress Notes (Signed)
[name] 34+3 wks,cephalic,fhr 154 bpm,RI .53,.57,.59,.52=36%,bpp 8/8 ,posterior placenta gr 2

## 2020-06-29 NOTE — Progress Notes (Signed)
HIGH-RISK PREGNANCY VISIT Patient name: Theresa Rosario MRN [id number]  Date of birth: 29-Dec-1985 Chief Complaint:   Routine Prenatal Visit, High Risk Gestation, and Pregnancy Ultrasound  History of Present Illness:   Theresa Rosario is a 35 y.o. G57P1011 female at [redacted]w[redacted]d with an Estimated Date of Delivery: 08/07/20 being seen today for ongoing management of a high-risk pregnancy complicated by chronic hypertension currently on labetalol 100 daily.  Today she reports no complaints.  Depression screen [facility] 2/9 05/11/2020 01/24/2020 01/04/2020 07/07/2019 02/09/2019  Decreased Interest 0 0 0 0 0  Down, Depressed, Hopeless 0 0 0 0 0  PHQ - 2 Score 0 0 0 0 0  Altered sleeping 2 0 - - -  Tired, decreased energy 1 2 - - -  Change in appetite 0 0 - - -  Feeling bad or failure about yourself  0 0 - - -  Trouble concentrating 0 0 - - -  Moving slowly or fidgety/restless 0 0 - - -  Suicidal thoughts 0 0 - - -  PHQ-9 Score 3 2 - - -    Contractions: Not present. Vag. Bleeding: None.  Movement: Present. denies leaking of fluid.  Review of Systems:   Pertinent items are noted in HPI Denies abnormal vaginal discharge w/ itching/odor/irritation, headaches, visual changes, shortness of breath, chest pain, abdominal pain, severe nausea/vomiting, or problems with urination or bowel movements unless otherwise stated above. Pertinent History Reviewed:  Reviewed past medical,surgical, social, obstetrical and family history.  Reviewed problem list, medications and allergies. Physical Assessment:   Vitals:   06/29/20 1236  BP: 128/83  Pulse: 94  Weight: 244 lb (110.7 kg)  Body mass index is 46.1 kg/m.           Physical Examination:   General appearance: alert, well appearing, and in no distress  Mental status: alert, oriented to person, place, and time  Skin: warm & dry   Extremities: Edema: None    Cardiovascular: normal heart rate noted  Respiratory: normal respiratory effort, no  distress  Abdomen: gravid, soft, non-tender  Pelvic: Cervical exam deferred         Fetal Status:     Movement: Present    Fetal Surveillance Testing today: BPP 8/8 w/normal Dopplers   Chaperone: N/A    Results for orders placed or performed in visit on 06/29/20 (from the past 24 hour(s))  POC Urinalysis Dipstick OB   Collection Time: 06/29/20 12:37 PM  Result Value Ref Range   Color, UA     Clarity, UA     Glucose, UA Negative Negative   Bilirubin, UA     Ketones, UA neg    Spec Grav, UA     Blood, UA neg    pH, UA     POC,PROTEIN,UA Trace Negative, Trace, Small (1+), Moderate (2+), Large (3+), 4+   Urobilinogen, UA     Nitrite, UA neg    Leukocytes, UA Negative Negative   Appearance     Odor      Assessment & Plan:  High-risk pregnancy: G3P1011 at [redacted]w[redacted]d with an Estimated Date of Delivery: 08/07/20   1) CHTN, stable, on labetalol 100 daily    Meds: No orders of the defined types were placed in this encounter.   Labs/procedures today: U/S  Treatment Plan:  Keep scheduled appts  Reviewed: Preterm labor symptoms and general obstetric precautions including but not limited to vaginal bleeding, contractions, leaking of fluid and fetal movement were reviewed in detail  with the patient.  All questions were answered. Does have home bp cuff. Office bp cuff given: not applicable. Check bp daily, let us know if consistently >140 and/or >90.  Follow-up: Return for keep scheduled.   Future Appointments  Date Time Provider Department Center  07/03/2020  2:30 PM [facility] NURSE [facility]  07/04/2020  2:00 PM [name], MD [facility]  07/05/2020  9:00 AM [facility] [name] [facility] None  07/05/2020 10:10 AM Jacklyn Shell, CNM [facility]  07/10/2020  2:30 PM [facility] NURSE [facility]  07/13/2020 10:30 AM [facility] [name] [facility] None  07/13/2020 11:30 AM Myna Hidalgo, DO [facility]  07/17/2020  2:30 PM [facility] NURSE [facility]   07/20/2020  9:15 AM [facility] [name] [facility] None  07/20/2020 10:10 AM Cheral Marker, CNM [facility]  07/24/2020  3:10 PM [facility] NURSE [facility]    Orders Placed This Encounter  Procedures  . POC Urinalysis Dipstick OB   Lazaro Arms 06/29/2020 12:51 PM

## 2020-07-02 DIAGNOSIS — Z20828 Contact with and (suspected) exposure to other viral communicable diseases: Secondary | ICD-10-CM | POA: Diagnosis not present

## 2020-07-03 ENCOUNTER — Inpatient Hospital Stay (HOSPITAL_BASED_OUTPATIENT_CLINIC_OR_DEPARTMENT_OTHER): Payer: Medicaid Other

## 2020-07-03 ENCOUNTER — Other Ambulatory Visit (INDEPENDENT_AMBULATORY_CARE_PROVIDER_SITE_OTHER): Payer: Medicaid Other

## 2020-07-03 ENCOUNTER — Ambulatory Visit (INDEPENDENT_AMBULATORY_CARE_PROVIDER_SITE_OTHER): Payer: Medicaid Other | Admitting: *Deleted

## 2020-07-03 ENCOUNTER — Other Ambulatory Visit: Payer: Self-pay

## 2020-07-03 ENCOUNTER — Inpatient Hospital Stay (HOSPITAL_COMMUNITY)
Admission: AD | Admit: 2020-07-03 | Discharge: 2020-07-03 | Disposition: A | Discharge status: Home / Self Care | Payer: Medicaid Other | Attending: Obstetrics and Gynecology | Admitting: Obstetrics and Gynecology

## 2020-07-03 ENCOUNTER — Other Ambulatory Visit: Payer: Self-pay | Admitting: Obstetrics & Gynecology

## 2020-07-03 ENCOUNTER — Encounter (HOSPITAL_COMMUNITY): Payer: Self-pay | Admitting: Obstetrics and Gynecology

## 2020-07-03 DIAGNOSIS — I10 Essential (primary) hypertension: Secondary | ICD-10-CM

## 2020-07-03 DIAGNOSIS — O288 Other abnormal findings on antenatal screening of mother: Secondary | ICD-10-CM

## 2020-07-03 DIAGNOSIS — Z3689 Encounter for other specified antenatal screening: Secondary | ICD-10-CM

## 2020-07-03 DIAGNOSIS — O10919 Unspecified pre-existing hypertension complicating pregnancy, unspecified trimester: Secondary | ICD-10-CM | POA: Diagnosis not present

## 2020-07-03 DIAGNOSIS — O289 Unspecified abnormal findings on antenatal screening of mother: Secondary | ICD-10-CM | POA: Insufficient documentation

## 2020-07-03 DIAGNOSIS — O10013 Pre-existing essential hypertension complicating pregnancy, third trimester: Secondary | ICD-10-CM | POA: Insufficient documentation

## 2020-07-03 DIAGNOSIS — Z3A35 35 weeks gestation of pregnancy: Secondary | ICD-10-CM | POA: Diagnosis not present

## 2020-07-03 DIAGNOSIS — O0993 Supervision of high risk pregnancy, unspecified, third trimester: Secondary | ICD-10-CM | POA: Diagnosis not present

## 2020-07-03 LAB — URINALYSIS, ROUTINE W REFLEX MICROSCOPIC
Bilirubin Urine: NEGATIVE
Glucose, UA: NEGATIVE mg/dL
Hgb urine dipstick: NEGATIVE
Ketones, ur: NEGATIVE mg/dL
Leukocytes,Ua: NEGATIVE
Nitrite: NEGATIVE
Protein, ur: NEGATIVE mg/dL
Specific Gravity, Urine: 1.01 (ref 1.005–1.030)
pH: 7 (ref 5.0–8.0)

## 2020-07-03 MED ORDER — LACTATED RINGERS IV BOLUS
1000.0000 mL | Freq: Once | INTRAVENOUS | Status: AC
  Administered 2020-07-03: 1000 mL via INTRAVENOUS

## 2020-07-03 NOTE — MAU Note (Signed)
Sent from [facility] for extended monitoring d/t 6/10 BPP. Pt reports no cntrx, VB or LOF. +FM

## 2020-07-03 NOTE — MAU Provider Note (Signed)
History     CSN: [id number]  Arrival date and time: 07/03/20 1759   Event Date/Time   First Provider Initiated Contact with Patient 07/03/20 1904      Chief Complaint  Patient presents with  . Non-stress Test   Ms. Theresa Rosario is a 35 y.o. G3P1011 at [redacted]w[redacted]d who presents to MAU for extended monitoring and another BPP. Patient was seen in the office today for a routine NST, which was found to be non-reactive. Patient then had a BPP performed in the office which was 6/8, with 2 points off for breathing, making her total score 6/10. Patient was then sent to MAU for extended monitoring and another BPP 4 hours after her initial. Patient denies any concerns and reports normal fetal movement.  Pt denies VB, LOF, ctx, decreased FM, vaginal discharge/odor/itching. Pt denies N/V, abdominal pain, constipation, diarrhea, or urinary problems. Pt denies fever, chills, fatigue, sweating or changes in appetite. Pt denies SOB or chest pain. Pt denies dizziness, HA, light-headedness, weakness.  Prenatal care provider? [facility], next appt 07/05/2020   OB History    Gravida  3   Para  1   Term  1   Preterm      AB  1   Living  1     SAB  1   IAB      Ectopic      Multiple      Live Births  1           Past Medical History:  Diagnosis Date  . Anemia 12/30/2018  . Blood in urine 10/18/2015  . Condyloma of female genitalia 10/21/2012   Subclitoral, TCA 7/31   . Depression   . Diabetes mellitus without complication (HCC)   . Fibromyalgia   . Hx of hematuria   . Hx of ovarian cyst   . Hypertension   . Irregular bleeding 12/02/2017  . Kidney stone   . Large breasts   . Lumbago 11/11/2011  . Migraine     Past Surgical History:  Procedure Laterality Date  . DENTAL SURGERY    . WISDOM TOOTH EXTRACTION Bilateral     Family History  Problem Relation Age of Onset  . Diabetes Mother   . Hypertension Mother   . Parkinson's disease Mother   . Colon cancer Mother  26  . Hypertension Father   . Heart disease Maternal Grandfather   . Coronary artery disease Other   . Diabetes Other   . Diabetes Paternal Grandmother   . Hypertension Paternal Grandmother   . Diabetes Maternal Grandmother     Social History   Tobacco Use  . Smoking status: Former Smoker    Types: Cigarettes    Quit date: 09/22/2019    Years since quitting: 0.7  . Smokeless tobacco: Never Used  . Tobacco comment: occasional vape  Vaping Use  . Vaping Use: Former  Substance Use Topics  . Alcohol use: Not Currently    Comment: socially  . Drug use: No    Allergies:  Allergies  Allergen Reactions  . Amoxicillin Hives  . Banana   . Other     Nuts   . Pineapple   . Motrin [Ibuprofen] Rash    MOTRIN (Brand only) causes this allergy    Medications Prior to Admission  Medication Sig Dispense Refill Last Dose  . acetaminophen (TYLENOL) 500 MG tablet Take 500-1,000 mg by mouth every 8 (eight) hours as needed for mild pain.    Past  Week at Unknown time  . aspirin 81 MG EC tablet Take 2 tablets (162 mg total) by mouth daily. Swallow whole. 60 tablet 12 07/02/2020 at Unknown time  . busPIRone (BUSPAR) 5 MG tablet Take by mouth.   07/02/2020 at Unknown time  . cyclobenzaprine (FLEXERIL) 10 MG tablet Take 1 tablet (10 mg total) by mouth at bedtime. 30 tablet 5 07/02/2020 at Unknown time  . Doxylamine-Pyridoxine (DICLEGIS) 10-10 MG TBEC 2 tabs q hs, if sx persist add 1 tab q am on day 3, if sx persist add 1 tab q afternoon on day 4 100 tablet 6 07/03/2020 at Unknown time  . labetalol (NORMODYNE) 100 MG tablet Take 2 tablets (200 mg total) by mouth daily. 60 tablet 5 07/02/2020 at Unknown time  . pantoprazole (PROTONIX) 20 MG tablet Take 1 tablet (20 mg total) by mouth daily. 90 tablet 3 07/02/2020 at Unknown time  . Prenatal Vit-Fe Fumarate-FA (PRENATAL VITAMIN PO) Take by mouth. 2 gummies daily   07/02/2020 at Unknown time  . Accu-Chek Softclix Lancets lancets Use as instructed 100 each 12    . Blood Glucose Monitoring Suppl DEVI Use to check glucose daily 1 Device 0   . Blood Pressure Monitor MISC For regular home bp monitoring during pregnancy 1 each 0   . diphenhydrAMINE (BENADRYL) 25 MG tablet Take 25 mg by mouth every 6 (six) hours as needed.    Unknown at Unknown time  . glucose blood test strip Use as instructed 200 each 12   . MELATONIN PO Take 1 tablet by mouth at bedtime as needed (for sleep).    Unknown at Unknown time    Review of Systems  Constitutional: Negative for chills, diaphoresis, fatigue and fever.  Eyes: Negative for visual disturbance.  Respiratory: Negative for shortness of breath.   Cardiovascular: Negative for chest pain.  Gastrointestinal: Negative for abdominal pain, constipation, diarrhea, nausea and vomiting.  Genitourinary: Negative for dysuria, flank pain, frequency, pelvic pain, urgency, vaginal bleeding and vaginal discharge.  Neurological: Negative for dizziness, weakness, light-headedness and headaches.   Physical Exam   Blood pressure 129/82, pulse (!) 104, temperature 98.2 F (36.8 C), temperature source Oral, resp. rate 18, SpO2 98 %.  Patient Vitals for the past 24 hrs:  BP Temp Temp src Pulse Resp SpO2  07/03/20 1825 129/82 98.2 F (36.8 C) Oral (!) 104 18 98 %   Physical Exam Vitals and nursing note reviewed.  Constitutional:      General: She is not in acute distress.    Appearance: Normal appearance. She is not ill-appearing, toxic-appearing or diaphoretic.  HENT:     Head: Normocephalic and atraumatic.  Pulmonary:     Effort: Pulmonary effort is normal.  Neurological:     Mental Status: She is alert and oriented to person, place, and time.  Psychiatric:        Mood and Affect: Mood normal.        Behavior: Behavior normal.        Thought Content: Thought content normal.        Judgment: Judgment normal.    Results for orders placed or performed during the hospital encounter of 07/03/20 (from the past 24 hour(s))   Urinalysis, Routine w reflex microscopic Urine, Clean Catch     Status: Abnormal   Collection Time: 07/03/20  6:44 PM  Result Value Ref Range   Color, Urine YELLOW YELLOW   APPearance HAZY (A) CLEAR   Specific Gravity, Urine 1.010 1.005 - 1.030  pH 7.0 5.0 - 8.0   Glucose, UA NEGATIVE NEGATIVE mg/dL   Hgb urine dipstick NEGATIVE NEGATIVE   Bilirubin Urine NEGATIVE NEGATIVE   Ketones, ur NEGATIVE NEGATIVE mg/dL   Protein, ur NEGATIVE NEGATIVE mg/dL   Nitrite NEGATIVE NEGATIVE   Leukocytes,Ua NEGATIVE NEGATIVE   US OB Follow Up  Result Date: 06/15/2020  [facility] for [facility] [address] [place] [address] FOLLOW UP SONOGRAM Theresa Rosario is in the office for a follow up sonogram for EFW,BPP and cord dopplers. She is a 35 y.o. year old G19P1011 with Estimated Date of Delivery: 08/07/20 by early ultrasound now at  [redacted]w[redacted]d weeks gestation. Thus far the pregnancy has been complicated by CHTN,abnormal gentic screening. GESTATION: SINGLETON PRESENTATION: cephalic FETAL ACTIVITY:          Heart rate         148          The fetus is active. AMNIOTIC FLUID: The amniotic fluid volume is  normal, 12 cm. PLACENTA LOCALIZATION:  posterior GRADE 2 CERVIX: Limited view ADNEXA: The ovaries are normal. GESTATIONAL AGE AND  BIOMETRICS: Gestational criteria: Estimated Date of Delivery: 08/07/20 by early ultrasound now at [redacted]w[redacted]d Previous Scans:5          BIPARIETAL DIAMETER           7.79 cm         31+1 weeks HEAD CIRCUMFERENCE           29.66 cm         32+5 weeks ABDOMINAL CIRCUMFERENCE           28.90 cm         32+6 weeks FEMUR LENGTH           6.52 cm         33+4 weeks                                                       AVERAGE EGA(BY THIS SCAN):  32+4 weeks                                                 ESTIMATED FETAL WEIGHT:       2087  grams, 56 % BIOPHYSICAL PROFILE:                                                                                                       COMMENTS GROSS BODY MOVEMENT                 2  TONE                2  RESPIRATIONS  2  AMNIOTIC FLUID                2                                                          SCORE:  8/8 (Note: NST was not performed as part of this antepartum testing) DOPPLER FLOW STUDIES: UMBILICAL ARTERY RI RATIOS:  .56,.59,.62,.58=43% ANATOMICAL SURVEY                                                                            COMMENTS CEREBRAL VENTRICLES yes normal  CHOROID PLEXUS yes normal  CEREBELLUM yes normal  CISTERNA MAGNA  Yes  normal   CAVUM SEPTI PELLUCIDI YES NORMAL          NASAL BONE yes normal  NOSE/LIP yes normal  FACIAL PROFILE yes normal  4 CHAMBERED HEART yes normal  OUTFLOW TRACTS YES normaL  3VV YES NORMAL  3VTV YES NORMAL  SITUS YES NORMAL      DIAPHRAGM yes normal  STOMACH yes normal  RENAL REGION yes normal  BLADDER yes normal          3 VESSEL CORD yes normal              GENITALIA yes normal female     SUSPECTED ABNORMALITIES:  no QUALITY OF SCAN: satisfactory TECHNICIAN COMMENTS: [name] 32+3 wks,cephalic,BPP 8/8,RI .56,.59,.62,.58=43%,AFI 12 cm,EFW 2087 g 56% A copy of this report including all images has been saved and backed up to a second source for retrieval if needed. All measures and details of the anatomical scan, placentation, fluid volume and pelvic anatomy are contained in that report. Amber Flora Lipps 06/15/2020 11:19 AM Clinical Impression and recommendations: I have reviewed the sonogram results above, combined with the patient's current clinical course, below are my impressions and any appropriate recommendations for management based on the sonographic findings. 1.  [id number] Estimated Date of Delivery: 08/07/20 by serial sonographic evaluations 2.  Fetal sonographic surveillance findings: a). Normal fluid volume b). Normal antepartum fetal assessment with BPP 8/8 c). Normal fetal Doppler ratios with consistent diastolic flow:  43% d). Normal growth percentile with appropriate interval  growth:  56% 3.  Normal general sonographic findings Recommend continued prenatal evaluations and care based on this sonogram and as clinically indicated from the patient's clinical course. Amaryllis Dyke Eure 06/15/2020 11:45 AM   US Fetal BPP W/O Non Stress  Result Date: 06/29/2020  [facility] for [facility] [address] [place] [address] FOLLOW UP SONOGRAM Theresa Rosario is in the office for a follow up sonogram for BPP and cord dopplers. She is a 35 y.o. year old G81P1011 with Estimated Date of Delivery: 08/07/20 by early ultrasound now at  [redacted]w[redacted]d weeks gestation. Thus far the pregnancy has been complicated by [facility]. GESTATION: SINGLETON PRESENTATION: cephalic FETAL ACTIVITY:          Heart rate         154  The fetus is active. AMNIOTIC FLUID: The amniotic fluid volume is  normal, 12.5 cm. PLACENTA LOCALIZATION:  posterior GRADE 2 CERVIX: Limited view ADNEXA: The ovaries are normal. GESTATIONAL AGE AND  BIOMETRICS: Gestational criteria: Estimated Date of Delivery: 08/07/20 by early ultrasound now at [redacted]w[redacted]d Previous Scans:7 BIOPHYSICAL PROFILE:                                                                                                      COMMENTS GROSS BODY MOVEMENT                 2  TONE                2  RESPIRATIONS                2  AMNIOTIC FLUID                2                                                          SCORE:  8/8 (Note: NST was not performed as part of this antepartum testing) DOPPLER FLOW STUDIES: UMBILICAL ARTERY RI RATIOS:   .53,.57,.59,.52=36% ANATOMICAL SURVEY                                                                            COMMENTS CEREBRAL VENTRICLES yes normal  CHOROID PLEXUS yes normal  CEREBELLUM yes normal  CISTERNA MAGNA  Yes  normal       NUCHAL REGION yes normal  ORBITS yes normal          FACIAL PROFILE yes normal  4 CHAMBERED HEART yes normal Limited view OUTFLOW TRACTS YES normaL  3VV YES NORMAL  3VTV YES NORMAL  SITUS YES  NORMAL      DIAPHRAGM yes normal  STOMACH yes normal  RENAL REGION yes normal  BLADDER yes normal          3 VESSEL CORD yes normal              GENITALIA yes normal female     SUSPECTED ABNORMALITIES:  no QUALITY OF SCAN: Limited view TECHNICIAN COMMENTS: [name] 34+3 wks,cephalic,fhr 154 bpm,RI .53,.57,.59,.52=36%,bpp 8/8 ,posterior placenta gr 2 A copy of this report including all images has been saved and backed up to a second source for retrieval if needed. All measures and details of the anatomical scan, placentation, fluid volume and pelvic anatomy are contained in that report. Amber Flora Lipps 06/29/2020 12:42 PM Clinical Impression and recommendations: I have reviewed the sonogram results above, combined with the patient's current  clinical course, below are my impressions and any appropriate recommendations for management based on the sonographic findings. 1.  [id number] Estimated Date of Delivery: 08/07/20 by serial sonographic evaluations 2.  Fetal sonographic surveillance findings: a). Normal fluid volume b). Normal antepartum fetal assessment with BPP 8/8 c). Normal fetal Doppler ratios with consistent diastolic flow:  36% 3.  Normal general sonographic findings Recommend continued prenatal evaluations and care based on this sonogram and as clinically indicated from the patient's clinical course. Amaryllis DykeLuther H Eure 06/29/2020 12:46 PM   US Fetal BPP W/O Non Stress  Result Date: 06/22/2020  [facility] [address] [place] [id number] FOLLOW UP SONOGRAM Theresa Rosario is in the office for a follow up sonogram for BPP and cord dopplers. She is a 35 y.o. year old 463P1011 with Estimated Date of Delivery: 08/07/20 by early ultrasound now at  7340w3d weeks gestation. Thus far the pregnancy has been complicated by CHTN,abnormal genetic screening. GESTATION: SINGLETON PRESENTATION: cephalic FETAL ACTIVITY:          Heart rate         144          The fetus is active. AMNIOTIC FLUID: The  amniotic fluid volume is  normal, 11 cm. PLACENTA LOCALIZATION:  posterior GRADE 2 CERVIX: Limited view ADNEXA: The ovaries are normal. GESTATIONAL AGE AND  BIOMETRICS: Gestational criteria: Estimated Date of Delivery: 08/07/20 by early ultrasound now at 3840w3d Previous Scans:6 BIOPHYSICAL PROFILE:                                                                                                      COMMENTS GROSS BODY MOVEMENT                 2  TONE                2  RESPIRATIONS                2  AMNIOTIC FLUID                2                                                          SCORE:  8/8 (Note: NST was not performed as part of this antepartum testing) DOPPLER FLOW STUDIES: UMBILICAL ARTERY RI RATIOS:  .56,.55,.52,.45=8.3% ANATOMICAL SURVEY                                                                            COMMENTS CEREBRAL VENTRICLES yes normal  CHOROID PLEXUS yes  normal  CEREBELLUM yes normal  CISTERNA MAGNA  Yes  normal   CAVUM SEPTI PELLUCIDI YES NORMAL              NOSE/LIP yes normal  FACIAL PROFILE yes normal  4 CHAMBERED HEART yes normal  OUTFLOW TRACTS YES normaL  3VV YES NORMAL  3VTV YES NORMAL  SITUS YES NORMAL      DIAPHRAGM yes normal  STOMACH yes normal  RENAL REGION yes normal  BLADDER yes normal          3 VESSEL CORD yes normal              GENITALIA yes normal female     SUSPECTED ABNORMALITIES:  no QUALITY OF SCAN: satisfactory TECHNICIAN COMMENTS: [name] 33+3 wks,cephalic,fhr 144 bpm,posterior placenta gr 2,afi 11 cm,BPP 8/8,RI .56,.55,.52,.45=8.3% A copy of this report including all images has been saved and backed up to a second source for retrieval if needed. All measures and details of the anatomical scan, placentation, fluid volume and pelvic anatomy are contained in that report. Amber Flora Lipps 06/22/2020 11:13 AM Clinical Impression and recommendations: I have reviewed the sonogram results above, combined with the patient's current clinical course, below are my impressions and any  appropriate recommendations for management based on the sonographic findings. 1.  [id number] Estimated Date of Delivery: 08/07/20 by serial sonographic evaluations 2.  Fetal sonographic surveillance findings: a). Normal fluid volume b). Normal antepartum fetal assessment with BPP 8/8 c). Normal fetal Doppler ratios with consistent diastolic flow:  8.3% 3.  Normal general sonographic findings Recommend continued prenatal evaluations and care based on this sonogram and as clinically indicated from the patient's clinical course. Amaryllis Dyke Eure 06/22/2020 11:26 AM   US Fetal BPP W/O Non Stress  Result Date: 06/15/2020  [facility] for [facility] [address] [place] [address] FOLLOW UP SONOGRAM Theresa Rosario is in the office for a follow up sonogram for EFW,BPP and cord dopplers. She is a 35 y.o. year old G27P1011 with Estimated Date of Delivery: 08/07/20 by early ultrasound now at  [redacted]w[redacted]d weeks gestation. Thus far the pregnancy has been complicated by CHTN,abnormal gentic screening. GESTATION: SINGLETON PRESENTATION: cephalic FETAL ACTIVITY:          Heart rate         148          The fetus is active. AMNIOTIC FLUID: The amniotic fluid volume is  normal, 12 cm. PLACENTA LOCALIZATION:  posterior GRADE 2 CERVIX: Limited view ADNEXA: The ovaries are normal. GESTATIONAL AGE AND  BIOMETRICS: Gestational criteria: Estimated Date of Delivery: 08/07/20 by early ultrasound now at [redacted]w[redacted]d Previous Scans:5          BIPARIETAL DIAMETER           7.79 cm         31+1 weeks HEAD CIRCUMFERENCE           29.66 cm         32+5 weeks ABDOMINAL CIRCUMFERENCE           28.90 cm         32+6 weeks FEMUR LENGTH           6.52 cm         33+4 weeks  AVERAGE EGA(BY THIS SCAN):  32+4 weeks                                                 ESTIMATED FETAL WEIGHT:       2087  grams, 56 % BIOPHYSICAL PROFILE:                                                                                                       COMMENTS GROSS BODY MOVEMENT                 2  TONE                2  RESPIRATIONS                2  AMNIOTIC FLUID                2                                                          SCORE:  8/8 (Note: NST was not performed as part of this antepartum testing) DOPPLER FLOW STUDIES: UMBILICAL ARTERY RI RATIOS:  .56,.59,.62,.58=43% ANATOMICAL SURVEY                                                                            COMMENTS CEREBRAL VENTRICLES yes normal  CHOROID PLEXUS yes normal  CEREBELLUM yes normal  CISTERNA MAGNA  Yes  normal   CAVUM SEPTI PELLUCIDI YES NORMAL          NASAL BONE yes normal  NOSE/LIP yes normal  FACIAL PROFILE yes normal  4 CHAMBERED HEART yes normal  OUTFLOW TRACTS YES normaL  3VV YES NORMAL  3VTV YES NORMAL  SITUS YES NORMAL      DIAPHRAGM yes normal  STOMACH yes normal  RENAL REGION yes normal  BLADDER yes normal          3 VESSEL CORD yes normal              GENITALIA yes normal female     SUSPECTED ABNORMALITIES:  no QUALITY OF SCAN: satisfactory TECHNICIAN COMMENTS: [name] 32+3 wks,cephalic,BPP 8/8,RI .56,.59,.62,.58=43%,AFI 12 cm,EFW 2087 g 56% A copy of this report including all images has been saved and backed up to a second source for retrieval if needed. All measures and details of the anatomical scan, placentation, fluid volume and pelvic anatomy are contained in that report. Amber Flora Lipps 06/15/2020  11:19 AM Clinical Impression and recommendations: I have reviewed the sonogram results above, combined with the patient's current clinical course, below are my impressions and any appropriate recommendations for management based on the sonographic findings. 1.  [id number] Estimated Date of Delivery: 08/07/20 by serial sonographic evaluations 2.  Fetal sonographic surveillance findings: a). Normal fluid volume b). Normal antepartum fetal assessment with BPP 8/8 c). Normal fetal Doppler ratios with consistent diastolic flow:  43% d). Normal growth  percentile with appropriate interval growth:  56% 3.  Normal general sonographic findings Recommend continued prenatal evaluations and care based on this sonogram and as clinically indicated from the patient's clinical course. Amaryllis Dyke Eure 06/15/2020 11:45 AM   US Fetal BPP W/O Non Stress  Result Date: 06/14/2020  [facility] for [facility] [address] [place] [address] FOLLOW UP SONOGRAM Theresa Rosario is in the office for a follow up sonogram for BBP and cord dopplers. She is a 35 y.o. year old G76P1011 with Estimated Date of Delivery: 08/07/20 by early ultrasound now at  [redacted]w[redacted]d weeks gestation. Thus far the pregnancy has been complicated by CHTN,NON reactive NST,predabetes,abnormal genetic finding . GESTATION: SINGLETON PRESENTATION: cephalic FETAL ACTIVITY:          Heart rate         164          The fetus is active. AMNIOTIC FLUID: The amniotic fluid volume is  normal, 11.5 cm. PLACENTA LOCALIZATION:  posterior GRADE 2 CERVIX: Limited view GESTATIONAL AGE AND  BIOMETRICS: Gestational criteria: Estimated Date of Delivery: 08/07/20 by early ultrasound now at [redacted]w[redacted]d Previous Scans:4 BIOPHYSICAL PROFILE:                                                                                                      COMMENTS GROSS BODY MOVEMENT                 2  TONE                2  RESPIRATIONS                2  AMNIOTIC FLUID                2                                                          SCORE:  8/8 (Note: NST was not performed as part of this antepartum testing) DOPPLER FLOW STUDIES: UMBILICAL ARTERY RI RATIOS:   .51,.55,.54,.61=24% ANATOMICAL SURVEY  COMMENTS CEREBRAL VENTRICLES yes normal  CHOROID PLEXUS yes normal  CEREBELLUM yes normal  CISTERNA MAGNA  Yes  normal   CAVUM SEPTI PELLUCIDI YES NORMAL                      4 CHAMBERED HEART yes normal  OUTFLOW TRACTS YES normaL  3VV YES NORMAL  3VTV YES  NORMAL  SITUS YES NORMAL      DIAPHRAGM yes normal  STOMACH yes normal  RENAL REGION yes normal  BLADDER yes normal          3 VESSEL CORD yes normal              GENITALIA yes normal female     SUSPECTED ABNORMALITIES:  no QUALITY OF SCAN: Limited view TECHNICIAN COMMENTS: [name] 32 wks,cephalic,BPP 8/8,fhr 164 bpm,posterior placenta gr 2,RI .51,.55,.54,.61=24%,AFI 11.5 cm A copy of this report including all images has been saved and backed up to a second source for retrieval if needed. All measures and details of the anatomical scan, placentation, fluid volume and pelvic anatomy are contained in that report. Amber Flora Lipps 06/12/2020 5:41 PM Clinical Impression and recommendations: I have reviewed the sonogram results above, combined with the patient's current clinical course, below are my impressions and any appropriate recommendations for management based on the sonographic findings. 1.  [id number] Estimated Date of Delivery: 08/07/20 by serial sonographic evaluations 2.  Fetal sonographic surveillance findings: a). Normal fluid volume b). Normal antepartum fetal assessment with BPP 8/8 c). Normal fetal Doppler ratios with consistent diastolic flow:  24% 3.  Normal general sonographic findings Recommend continued prenatal evaluations and care based on this sonogram and as clinically indicated from the patient's clinical course. Amaryllis Dyke Eure 06/14/2020 2:30 PM   [name] UA Cord Doppler  Result Date: 06/29/2020  [facility] for [facility] [address] [place] [address] FOLLOW UP SONOGRAM Theresa Rosario is in the office for a follow up sonogram for BPP and cord dopplers. She is a 35 y.o. year old G6P1011 with Estimated Date of Delivery: 08/07/20 by early ultrasound now at  [redacted]w[redacted]d weeks gestation. Thus far the pregnancy has been complicated by [facility]. GESTATION: SINGLETON PRESENTATION: cephalic FETAL ACTIVITY:          Heart rate         154          The fetus is active. AMNIOTIC FLUID: The amniotic  fluid volume is  normal, 12.5 cm. PLACENTA LOCALIZATION:  posterior GRADE 2 CERVIX: Limited view ADNEXA: The ovaries are normal. GESTATIONAL AGE AND  BIOMETRICS: Gestational criteria: Estimated Date of Delivery: 08/07/20 by early ultrasound now at [redacted]w[redacted]d Previous Scans:7 BIOPHYSICAL PROFILE:                                                                                                      COMMENTS GROSS BODY MOVEMENT                 2  TONE  2  RESPIRATIONS                2  AMNIOTIC FLUID                2                                                          SCORE:  8/8 (Note: NST was not performed as part of this antepartum testing) DOPPLER FLOW STUDIES: UMBILICAL ARTERY RI RATIOS:   .53,.57,.59,.52=36% ANATOMICAL SURVEY                                                                            COMMENTS CEREBRAL VENTRICLES yes normal  CHOROID PLEXUS yes normal  CEREBELLUM yes normal  CISTERNA MAGNA  Yes  normal       NUCHAL REGION yes normal  ORBITS yes normal          FACIAL PROFILE yes normal  4 CHAMBERED HEART yes normal Limited view OUTFLOW TRACTS YES normaL  3VV YES NORMAL  3VTV YES NORMAL  SITUS YES NORMAL      DIAPHRAGM yes normal  STOMACH yes normal  RENAL REGION yes normal  BLADDER yes normal          3 VESSEL CORD yes normal              GENITALIA yes normal female     SUSPECTED ABNORMALITIES:  no QUALITY OF SCAN: Limited view TECHNICIAN COMMENTS: [name] 34+3 wks,cephalic,fhr 154 bpm,RI .53,.57,.59,.52=36%,bpp 8/8 ,posterior placenta gr 2 A copy of this report including all images has been saved and backed up to a second source for retrieval if needed. All measures and details of the anatomical scan, placentation, fluid volume and pelvic anatomy are contained in that report. Amber Flora Lipps 06/29/2020 12:42 PM Clinical Impression and recommendations: I have reviewed the sonogram results above, combined with the patient's current clinical course, below are my impressions and any appropriate  recommendations for management based on the sonographic findings. 1.  [id number] Estimated Date of Delivery: 08/07/20 by serial sonographic evaluations 2.  Fetal sonographic surveillance findings: a). Normal fluid volume b). Normal antepartum fetal assessment with BPP 8/8 c). Normal fetal Doppler ratios with consistent diastolic flow:  36% 3.  Normal general sonographic findings Recommend continued prenatal evaluations and care based on this sonogram and as clinically indicated from the patient's clinical course. Amaryllis Dyke Eure 06/29/2020 12:46 PM   [name] UA Cord Doppler  Result Date: 06/22/2020  [facility] [address] [place] [address] FOLLOW UP SONOGRAM Theresa Rosario is in the office for a follow up sonogram for BPP and cord dopplers. She is a 35 y.o. year old G78P1011 with Estimated Date of Delivery: 08/07/20 by early ultrasound now at  [redacted]w[redacted]d weeks gestation. Thus far the pregnancy has been complicated by CHTN,abnormal genetic screening. GESTATION: SINGLETON PRESENTATION: cephalic FETAL ACTIVITY:          Heart rate  144          The fetus is active. AMNIOTIC FLUID: The amniotic fluid volume is  normal, 11 cm. PLACENTA LOCALIZATION:  posterior GRADE 2 CERVIX: Limited view ADNEXA: The ovaries are normal. GESTATIONAL AGE AND  BIOMETRICS: Gestational criteria: Estimated Date of Delivery: 08/07/20 by early ultrasound now at [redacted]w[redacted]d Previous Scans:6 BIOPHYSICAL PROFILE:                                                                                                      COMMENTS GROSS BODY MOVEMENT                 2  TONE                2  RESPIRATIONS                2  AMNIOTIC FLUID                2                                                          SCORE:  8/8 (Note: NST was not performed as part of this antepartum testing) DOPPLER FLOW STUDIES: UMBILICAL ARTERY RI RATIOS:  .56,.55,.52,.45=8.3% ANATOMICAL SURVEY                                                                             COMMENTS CEREBRAL VENTRICLES yes normal  CHOROID PLEXUS yes normal  CEREBELLUM yes normal  CISTERNA MAGNA  Yes  normal   CAVUM SEPTI PELLUCIDI YES NORMAL              NOSE/LIP yes normal  FACIAL PROFILE yes normal  4 CHAMBERED HEART yes normal  OUTFLOW TRACTS YES normaL  3VV YES NORMAL  3VTV YES NORMAL  SITUS YES NORMAL      DIAPHRAGM yes normal  STOMACH yes normal  RENAL REGION yes normal  BLADDER yes normal          3 VESSEL CORD yes normal              GENITALIA yes normal female     SUSPECTED ABNORMALITIES:  no QUALITY OF SCAN: satisfactory TECHNICIAN COMMENTS: [name] 33+3 wks,cephalic,fhr 144 bpm,posterior placenta gr 2,afi 11 cm,BPP 8/8,RI .56,.55,.52,.45=8.3% A copy of this report including all images has been saved and backed up to a second source for retrieval if needed. All measures and details of the anatomical scan, placentation, fluid volume and pelvic anatomy are contained in that report. Amber Flora Lipps 06/22/2020 11:13 AM Clinical Impression and recommendations: I have reviewed  the sonogram results above, combined with the patient's current clinical course, below are my impressions and any appropriate recommendations for management based on the sonographic findings. 1.  [id number] Estimated Date of Delivery: 08/07/20 by serial sonographic evaluations 2.  Fetal sonographic surveillance findings: a). Normal fluid volume b). Normal antepartum fetal assessment with BPP 8/8 c). Normal fetal Doppler ratios with consistent diastolic flow:  8.3% 3.  Normal general sonographic findings Recommend continued prenatal evaluations and care based on this sonogram and as clinically indicated from the patient's clinical course. Amaryllis Dyke Eure 06/22/2020 11:26 AM   [name] UA Cord Doppler  Result Date: 06/15/2020  [facility] [address] [place] [address] FOLLOW UP SONOGRAM Theresa Rosario is in the office for a follow up sonogram for EFW,BPP and cord dopplers. She  is a 35 y.o. year old [id number] with Estimated Date of Delivery: 08/07/20 by early ultrasound now at  [redacted]w[redacted]d weeks gestation. Thus far the pregnancy has been complicated by CHTN,abnormal gentic screening. GESTATION: SINGLETON PRESENTATION: cephalic FETAL ACTIVITY:          Heart rate         148          The fetus is active. AMNIOTIC FLUID: The amniotic fluid volume is  normal, 12 cm. PLACENTA LOCALIZATION:  posterior GRADE 2 CERVIX: Limited view ADNEXA: The ovaries are normal. GESTATIONAL AGE AND  BIOMETRICS: Gestational criteria: Estimated Date of Delivery: 08/07/20 by early ultrasound now at [redacted]w[redacted]d Previous Scans:5          BIPARIETAL DIAMETER           7.79 cm         31+1 weeks HEAD CIRCUMFERENCE           29.66 cm         32+5 weeks ABDOMINAL CIRCUMFERENCE           28.90 cm         32+6 weeks FEMUR LENGTH           6.52 cm         33+4 weeks                                                       AVERAGE EGA(BY THIS SCAN):  32+4 weeks                                                 ESTIMATED FETAL WEIGHT:       2087  grams, 56 % BIOPHYSICAL PROFILE:                                                                                                      COMMENTS GROSS BODY MOVEMENT  2  TONE                2  RESPIRATIONS                2  AMNIOTIC FLUID                2                                                          SCORE:  8/8 (Note: NST was not performed as part of this antepartum testing) DOPPLER FLOW STUDIES: UMBILICAL ARTERY RI RATIOS:  .56,.59,.62,.58=43% ANATOMICAL SURVEY                                                                            COMMENTS CEREBRAL VENTRICLES yes normal  CHOROID PLEXUS yes normal  CEREBELLUM yes normal  CISTERNA MAGNA  Yes  normal   CAVUM SEPTI PELLUCIDI YES NORMAL          NASAL BONE yes normal  NOSE/LIP yes normal  FACIAL PROFILE yes normal  4 CHAMBERED HEART yes normal  OUTFLOW TRACTS YES normaL  3VV YES NORMAL  3VTV YES NORMAL  SITUS YES NORMAL       DIAPHRAGM yes normal  STOMACH yes normal  RENAL REGION yes normal  BLADDER yes normal          3 VESSEL CORD yes normal              GENITALIA yes normal female     SUSPECTED ABNORMALITIES:  no QUALITY OF SCAN: satisfactory TECHNICIAN COMMENTS: [name] 32+3 wks,cephalic,BPP 8/8,RI .56,.59,.62,.58=43%,AFI 12 cm,EFW 2087 g 56% A copy of this report including all images has been saved and backed up to a second source for retrieval if needed. All measures and details of the anatomical scan, placentation, fluid volume and pelvic anatomy are contained in that report. Amber Flora Lipps 06/15/2020 11:19 AM Clinical Impression and recommendations: I have reviewed the sonogram results above, combined with the patient's current clinical course, below are my impressions and any appropriate recommendations for management based on the sonographic findings. 1.  [id number] Estimated Date of Delivery: 08/07/20 by serial sonographic evaluations 2.  Fetal sonographic surveillance findings: a). Normal fluid volume b). Normal antepartum fetal assessment with BPP 8/8 c). Normal fetal Doppler ratios with consistent diastolic flow:  43% d). Normal growth percentile with appropriate interval growth:  56% 3.  Normal general sonographic findings Recommend continued prenatal evaluations and care based on this sonogram and as clinically indicated from the patient's clinical course. Amaryllis Dyke Eure 06/15/2020 11:45 AM   [name] UA Cord Doppler  Result Date: 06/14/2020  [facility] [address] [place] [address] FOLLOW UP SONOGRAM Theresa Rosario is in the office for a follow up sonogram for BBP and cord dopplers. She is a 35 y.o. year old G65P1011 with Estimated Date of Delivery: 08/07/20 by early ultrasound now at  [redacted]w[redacted]d weeks gestation. Thus far the pregnancy has been complicated  by CHTN,NON reactive NST,predabetes,abnormal genetic finding . GESTATION: SINGLETON PRESENTATION: cephalic FETAL ACTIVITY:          Heart  rate         164          The fetus is active. AMNIOTIC FLUID: The amniotic fluid volume is  normal, 11.5 cm. PLACENTA LOCALIZATION:  posterior GRADE 2 CERVIX: Limited view GESTATIONAL AGE AND  BIOMETRICS: Gestational criteria: Estimated Date of Delivery: 08/07/20 by early ultrasound now at [redacted]w[redacted]d Previous Scans:4 BIOPHYSICAL PROFILE:                                                                                                      COMMENTS GROSS BODY MOVEMENT                 2  TONE                2  RESPIRATIONS                2  AMNIOTIC FLUID                2                                                          SCORE:  8/8 (Note: NST was not performed as part of this antepartum testing) DOPPLER FLOW STUDIES: UMBILICAL ARTERY RI RATIOS:   .51,.55,.54,.61=24% ANATOMICAL SURVEY                                                                            COMMENTS CEREBRAL VENTRICLES yes normal  CHOROID PLEXUS yes normal  CEREBELLUM yes normal  CISTERNA MAGNA  Yes  normal   CAVUM SEPTI PELLUCIDI YES NORMAL                      4 CHAMBERED HEART yes normal  OUTFLOW TRACTS YES normaL  3VV YES NORMAL  3VTV YES NORMAL  SITUS YES NORMAL      DIAPHRAGM yes normal  STOMACH yes normal  RENAL REGION yes normal  BLADDER yes normal          3 VESSEL CORD yes normal              GENITALIA yes normal female     SUSPECTED ABNORMALITIES:  no QUALITY OF SCAN: Limited view TECHNICIAN COMMENTS: [name] 32 wks,cephalic,BPP 8/8,fhr 164 bpm,posterior placenta gr 2,RI .51,.55,.54,.61=24%,AFI 11.5 cm A copy of this report including all images has been saved and backed up to a second source for retrieval if needed. All measures and details  of the anatomical scan, placentation, fluid volume and pelvic anatomy are contained in that report. Amber Flora Lipps 06/12/2020 5:41 PM Clinical Impression and recommendations: I have reviewed the sonogram results above, combined with the patient's current clinical course, below are my impressions and any  appropriate recommendations for management based on the sonographic findings. 1.  [id number] Estimated Date of Delivery: 08/07/20 by serial sonographic evaluations 2.  Fetal sonographic surveillance findings: a). Normal fluid volume b). Normal antepartum fetal assessment with BPP 8/8 c). Normal fetal Doppler ratios with consistent diastolic flow:  24% 3.  Normal general sonographic findings Recommend continued prenatal evaluations and care based on this sonogram and as clinically indicated from the patient's clinical course. Lazaro Arms 06/14/2020 2:30 PM   MAU Course  Procedures  MDM -BPP/NST 6/10 in office -EFM: non-reactive       -baseline: 145       -variability: minimal/moderate       -accels: present, 15x15 (one approximately every after fluids)       -decels: absent       -TOCO: quiet -BPP: 6/8 (-2 for breathing, no effort per [name] tech) -results reviewed with Dr. Jolayne Panther who reviewed tracing and determines that patient is OK to be discharged home and can f/u as planned at [facility] on 07/05/2020 -pt discharged to home in stable condition  Orders Placed This Encounter  Procedures  . [name] MFM FETAL BPP WO NON STRESS    Standing Status:   Standing    Number of Occurrences:   1    Order Specific Question:   Symptom/Reason for Exam    Answer:   NST (non-stress test) nonreactive [[id number]]  . Urinalysis, Routine w reflex microscopic Urine, Clean Catch    Standing Status:   Standing    Number of Occurrences:   1  . Insert peripheral IV    Standing Status:   Standing    Number of Occurrences:   1  . Discharge patient    Order Specific Question:   Discharge disposition    Answer:   01-Home or Self Care [1]    Order Specific Question:   Discharge patient date    Answer:   07/03/2020   Meds ordered this encounter  Medications  . lactated ringers bolus 1,000 mL   Assessment and Plan   1. Non-reactive NST (non-stress test)   2. Supervision of high risk pregnancy in third  trimester   3. NST (non-stress test) nonreactive   4. [redacted] weeks gestation of pregnancy    Allergies as of 07/03/2020      Reactions   Amoxicillin Hives   Banana    Other    Nuts   Pineapple    Motrin [ibuprofen] Rash   MOTRIN (Brand only) causes this allergy      Medication List    TAKE these medications   Accu-Chek Softclix Lancets lancets Use as instructed   acetaminophen 500 MG tablet Commonly known as: TYLENOL Take 500-1,000 mg by mouth every 8 (eight) hours as needed for mild pain.   aspirin 81 MG EC tablet Take 2 tablets (162 mg total) by mouth daily. Swallow whole.   Blood Glucose Monitoring Suppl Devi Use to check glucose daily   Blood Pressure Monitor Misc For regular home bp monitoring during pregnancy   busPIRone 5 MG tablet Commonly known as: BUSPAR Take by mouth.   cyclobenzaprine 10 MG tablet Commonly known as: FLEXERIL Take 1 tablet (10 mg total) by mouth at  bedtime.   diphenhydrAMINE 25 MG tablet Commonly known as: BENADRYL Take 25 mg by mouth every 6 (six) hours as needed.   Doxylamine-Pyridoxine 10-10 MG Tbec Commonly known as: Diclegis 2 tabs q hs, if sx persist add 1 tab q am on day 3, if sx persist add 1 tab q afternoon on day 4   glucose blood test strip Use as instructed   labetalol 100 MG tablet Commonly known as: NORMODYNE Take 2 tablets (200 mg total) by mouth daily.   MELATONIN PO Take 1 tablet by mouth at bedtime as needed (for sleep).   pantoprazole 20 MG tablet Commonly known as: PROTONIX Take 1 tablet (20 mg total) by mouth daily.   PRENATAL VITAMIN PO Take by mouth. 2 gummies daily      -keep OB appt with FT on 07/05/2020 -fetal movement precautions discussed -return MAU precautions given -pt discharged to home in stable condition  Odie Sera Heavenly Christine 07/03/2020, 9:34 PM

## 2020-07-03 NOTE — Progress Notes (Signed)
Donia Ast NP in earlier to discuss d/c plan. Written and verbal d/c instructions given and understanding voiced. Reviewed fetal kick counts with pt and and voiced understanding. To keep upcoming  scheduled appts

## 2020-07-03 NOTE — Discharge Instructions (Signed)
Fetal Movement Counts Patient Name: ________________________________________________ Patient Due Date: ____________________  What is a fetal movement count? A fetal movement count is the number of times that you feel your baby move during a certain amount of time. This may also be called a fetal kick count. A fetal movement count is recommended for every pregnant woman. You may be asked to start counting fetal movements as early as week 28 of your pregnancy. Pay attention to when your baby is most active. You may notice your baby's sleep and wake cycles. You may also notice things that make your baby move more. You should do a fetal movement count:  When your baby is normally most active.  At the same time each day. A good time to count movements is while you are resting, after having something to eat and drink. How do I count fetal movements? 1. Find a quiet, comfortable area. Sit, or lie down on your side. 2. Write down the date, the start time and stop time, and the number of movements that you felt between those two times. Take this information with you to your health care visits. 3. Write down your start time when you feel the first movement. 4. Count kicks, flutters, swishes, rolls, and jabs. You should feel at least 10 movements. 5. You may stop counting after you have felt 10 movements, or if you have been counting for 2 hours. Write down the stop time. 6. If you do not feel 10 movements in 2 hours, contact your health care provider for further instructions. Your health care provider may want to do additional tests to assess your baby's well-being. Contact a health care provider if:  You feel fewer than 10 movements in 2 hours.  Your baby is not moving like he or she usually does. Date: ____________ Start time: ____________ Stop time: ____________ Movements: ____________ Date: ____________ Start time: ____________ Stop time: ____________ Movements: ____________ Date: ____________  Start time: ____________ Stop time: ____________ Movements: ____________ Date: ____________ Start time: ____________ Stop time: ____________ Movements: ____________ Date: ____________ Start time: ____________ Stop time: ____________ Movements: ____________ Date: ____________ Start time: ____________ Stop time: ____________ Movements: ____________ Date: ____________ Start time: ____________ Stop time: ____________ Movements: ____________ Date: ____________ Start time: ____________ Stop time: ____________ Movements: ____________ Date: ____________ Start time: ____________ Stop time: ____________ Movements: ____________ This information is not intended to replace advice given to you by your health care provider. Make sure you discuss any questions you have with your health care provider. Document Revised: 10/28/2018 Document Reviewed: 10/28/2018 Elsevier Patient Education  2021 Elsevier Inc.        Preterm Labor The normal length of a pregnancy is 39-41 weeks. Preterm labor is when labor starts before 37 completed weeks of pregnancy. Babies who are born prematurely and survive may not be fully developed and may be at an increased risk for long-term problems such as cerebral palsy, developmental delays, and vision and hearing problems. Babies who are born too early may have problems soon after birth. Problems may include regulating blood sugar, body temperature, heart rate, and breathing rate. These babies often have trouble with feeding. The risk of having problems is highest for babies who are born before 34 weeks of pregnancy. What are the causes? The exact cause of this condition is not known. What increases the risk? You are more likely to have preterm labor if you have certain risk factors that relate to your medical history, problems with present and past pregnancies, and lifestyle factors.   Medical history  You have abnormalities of the uterus, including a short cervix.  You have STIs  (sexually transmitted infections), or other infections of the urinary tract and the vagina.  You have chronic illnesses, such as blood clotting problems, diabetes, or high blood pressure.  You are overweight or underweight. Present and past pregnancies  You have had preterm labor before.  You are pregnant with twins or other multiples.  You have been diagnosed with a condition in which the placenta covers your cervix (placenta previa).  You waited less than 6 months between giving birth and becoming pregnant again.  Your unborn baby has some abnormalities.  You have vaginal bleeding during pregnancy.  You became pregnant through in vitro fertilization (IVF). Lifestyle and environmental factors  You use tobacco products.  You drink alcohol.  You use street drugs.  You have stress and no social support.  You experience domestic violence.  You are exposed to certain chemicals or environmental pollutants. Other factors  You are younger than age 17 or older than age 35. What are the signs or symptoms? Symptoms of this condition include:  Cramps similar to those that can happen during a menstrual period. The cramps may happen with diarrhea.  Pain in the abdomen or lower back.  Regular contractions that may feel like tightening of the abdomen.  A feeling of increased pressure in the pelvis.  Increased watery or bloody mucus discharge from the vagina.  Water breaking (ruptured amniotic sac). How is this diagnosed? This condition is diagnosed based on:  Your medical history and a physical exam.  A pelvic exam.  An ultrasound.  Monitoring your uterus for contractions.  Other tests, including: ? A swab of the cervix to check for a chemical called fetal fibronectin. ? Urine tests. How is this treated? Treatment for this condition depends on the length of your pregnancy, your condition, and the health of your baby. Treatment may include:  Taking medicines, such  as: ? Hormone medicines. These may be given early in pregnancy to help support the pregnancy. ? Medicines to stop contractions. ? Medicines to help mature the baby's lungs. These may be prescribed if the risk of delivery is high. ? Medicines to prevent your baby from developing cerebral palsy.  Bed rest. If the labor happens before 34 weeks of pregnancy, you may need to stay in the hospital.  Delivery of the baby. Follow these instructions at home:  Do not use any products that contain nicotine or tobacco, such as cigarettes, e-cigarettes, and chewing tobacco. If you need help quitting, ask your health care provider.  Do not drink alcohol.  Take over-the-counter and prescription medicines only as told by your health care provider.  Rest as told by your health care provider.  Return to your normal activities as told by your health care provider. Ask your health care provider what activities are safe for you.  Keep all follow-up visits as told by your health care provider. This is important.   How is this prevented? To increase your chance of having a full-term pregnancy:  Do not use street drugs or medicines that have not been prescribed to you during your pregnancy.  Talk with your health care provider before taking any herbal supplements, even if you have been taking them regularly.  Make sure you gain a healthy amount of weight during your pregnancy.  Watch for infection. If you think that you might have an infection, get it checked right away. Symptoms of infection may   include: ? Fever. ? Abnormal vaginal discharge or discharge that smells bad. ? Pain or burning with urination. ? Needing to urinate urgently. ? Frequently urinating or passing small amounts of urine frequently. ? Blood in your urine. ? Urine that smells bad or unusual.  Tell your health care provider if you have had preterm labor before. Contact a health care provider if:  You think you are going into  preterm labor.  You have signs or symptoms of preterm labor.  You have symptoms of infection. Get help right away if:  You are having regular, painful contractions every 5 minutes or less.  Your water breaks. Summary  Preterm labor is labor that starts before you reach 37 weeks of pregnancy.  Delivering your baby early increases your baby's risk of developing lifelong problems.  The exact cause of preterm labor is unknown. However, having an abnormal uterus, an STI (sexually transmitted infection), or vaginal bleeding during pregnancy increases your risk for preterm labor.  Keep all follow-up visits as told by your health care provider. This is important.  Contact a health care provider if you have signs or symptoms of preterm labor. This information is not intended to replace advice given to you by your health care provider. Make sure you discuss any questions you have with your health care provider. Document Revised: 04/12/2019 Document Reviewed: 04/12/2019 Elsevier Patient Education  2021 Elsevier Inc.  

## 2020-07-03 NOTE — Progress Notes (Signed)
[name] 35 wks,cephalic,BPP 6/8 no breathing,fhr 161 bpm,RI .56,.54,.54,.51=24%,posterior placenta gr 3,discussed results w/Dr Charlotta Newton

## 2020-07-03 NOTE — Progress Notes (Addendum)
   NURSE VISIT- NST  SUBJECTIVE:  Theresa Rosario is a 35 y.o. G32P1011 female at [redacted]w[redacted]d, here for a NST for pregnancy complicated by [facility].  She reports active fetal movement, contractions: none, vaginal bleeding: none, membranes: intact.   OBJECTIVE:  BP 139/78   Pulse 100   Wt 246 lb (111.6 kg)   LMP  (LMP Unknown)   BMI 46.48 kg/m   Appears well, no apparent distress  No results found for this or any previous visit (from the past 24 hour(s)).  NST: FHR baseline 150 bpm, Variability: minimal , Accelerations:10x10, Decelerations:  Absent= Cat 2/nonreactive Toco: none   ASSESSMENT: G3P1011 at [redacted]w[redacted]d with CHTN NST nonreactive  PLAN: EFM strip reviewed by [name], CNM, [facility]   Recommendations: work-in for BPP    Jobe Marker  07/03/2020 4:42 PM   Chart reviewed for nurse visit. Agree with plan of care.  Cheral Marker, [place] 07/04/2020 12:50 PM

## 2020-07-04 ENCOUNTER — Ambulatory Visit: Payer: Medicaid Other | Admitting: Internal Medicine

## 2020-07-04 ENCOUNTER — Encounter: Payer: Self-pay | Admitting: Internal Medicine

## 2020-07-04 ENCOUNTER — Other Ambulatory Visit: Payer: Self-pay | Admitting: Obstetrics & Gynecology

## 2020-07-04 VITALS — BP 109/73 | HR 76 | Temp 97.6°F | Ht 61.0 in | Wt 247.0 lb

## 2020-07-04 DIAGNOSIS — O10013 Pre-existing essential hypertension complicating pregnancy, third trimester: Secondary | ICD-10-CM | POA: Diagnosis not present

## 2020-07-04 DIAGNOSIS — I10 Essential (primary) hypertension: Secondary | ICD-10-CM

## 2020-07-04 DIAGNOSIS — Z3A35 35 weeks gestation of pregnancy: Secondary | ICD-10-CM

## 2020-07-04 DIAGNOSIS — Z3493 Encounter for supervision of normal pregnancy, unspecified, third trimester: Secondary | ICD-10-CM

## 2020-07-04 DIAGNOSIS — K219 Gastro-esophageal reflux disease without esophagitis: Secondary | ICD-10-CM | POA: Diagnosis not present

## 2020-07-04 DIAGNOSIS — O289 Unspecified abnormal findings on antenatal screening of mother: Secondary | ICD-10-CM

## 2020-07-04 DIAGNOSIS — O10919 Unspecified pre-existing hypertension complicating pregnancy, unspecified trimester: Secondary | ICD-10-CM

## 2020-07-04 NOTE — Assessment & Plan Note (Signed)
BP Readings from Last 1 Encounters:  07/04/20 109/73   Well-controlled with Labetalol 200 mg QD Counseled for compliance with the medications Continue to f/u with OB/GYN for HTN during pregnancy, no signs of overt eclampsia currently

## 2020-07-04 NOTE — Patient Instructions (Signed)
Please continue to take medications as prescribed.  Please continue to follow low salt diet.  Please follow up with OB/GYN as scheduled and discuss about Labetalol dose in the next appointment.

## 2020-07-04 NOTE — Assessment & Plan Note (Signed)
On Pantoprazole 

## 2020-07-04 NOTE — Progress Notes (Signed)
Established Patient Office Visit  Subjective:  Patient ID: Theresa Rosario, female    DOB: 1986-02-08  Age: 35 y.o. MRN: [id number]  CC:  Chief Complaint  Patient presents with  . Follow-up    HPI Theresa Rosario is a 35 year old old female with past medical history of hypertension, prediabetes, fibromyalgia, insomnia, OSA and morbid obesity presents for follow up of her HTN.  She is 35-weeks pregnant. She is currently taking Labetalol for hypertension. She denies any headache, dizziness, chest pain, dyspnea or palpitations. Denies any LE edema or tremors.  Past Medical History:  Diagnosis Date  . Anemia 12/30/2018  . Blood in urine 10/18/2015  . Condyloma of female genitalia 10/21/2012   Subclitoral, TCA 7/31   . Depression   . Diabetes mellitus without complication (HCC)   . Fibromyalgia   . Hx of hematuria   . Hx of ovarian cyst   . Hypertension   . Irregular bleeding 12/02/2017  . Kidney stone   . Large breasts   . Lumbago 11/11/2011  . Migraine     Past Surgical History:  Procedure Laterality Date  . DENTAL SURGERY    . WISDOM TOOTH EXTRACTION Bilateral     Family History  Problem Relation Age of Onset  . Diabetes Mother   . Hypertension Mother   . Parkinson's disease Mother   . Colon cancer Mother 28  . Hypertension Father   . Heart disease Maternal Grandfather   . Coronary artery disease Other   . Diabetes Other   . Diabetes Paternal Grandmother   . Hypertension Paternal Grandmother   . Diabetes Maternal Grandmother     Social History   Socioeconomic History  . Marital status: Significant Other    Spouse name: Not on file  . Number of children: 1  . Years of education: college  . Highest education level: Master's degree (e.g., MA, MS, MEng, MEd, MSW, MBA)  Occupational History  . 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 Smoking status: Former Smoker    Types: Cigarettes    Quit date: 09/22/2019    Years since  quitting: 0.7  . Smokeless tobacco: Never Used  . Tobacco comment: occasional vape  Vaping Use  . Vaping Use: Former  Substance and Sexual Activity  . Alcohol use: Not Currently    Comment: socially  . Drug use: No  . Sexual activity: Yes    Birth control/protection: None  Other Topics Concern  . Not on file  Social History Narrative   Lives at home with boyfriend and daughter.   Right-handed.   2-3 cups caffeine per day.   Social Determinants of Health   Financial Resource Strain: Medium Risk  . Difficulty of Paying Living Expenses: Somewhat hard  Food Insecurity: Food Insecurity Present  . Worried About Programme researcher, broadcasting/film/video in the Last Year: Sometimes true  . Ran Out of Food in the Last Year: Never true  Transportation Needs: No Transportation Needs  . Lack of Transportation (Medical): No  . Lack of Transportation (Non-Medical): No  Physical Activity: Insufficiently Active  . Days of Exercise per Week: 2 days  . Minutes of Exercise per Session: 20 min  Stress: Stress Concern Present  . Feeling of Stress : To some extent  Social Connections: Moderately Integrated  . Frequency of Communication with Friends and Family: Three times a week  . Frequency of Social Gatherings with Friends and Family: Once a week  . Attends Religious  Services: 1 to 4 times per year  . Active Member of Clubs or Organizations: No  . Attends Banker Meetings: Never  . Marital Status: Living with partner  Intimate Partner Violence: Not At Risk  . Fear of Current or Ex-Partner: No  . Emotionally Abused: No  . Physically Abused: No  . Sexually Abused: No    Outpatient Medications Prior to Visit  Medication Sig Dispense Refill  . Accu-Chek Softclix Lancets lancets Use as instructed 100 each 12  . acetaminophen (TYLENOL) 500 MG tablet Take 500-1,000 mg by mouth every 8 (eight) hours as needed for mild pain.     Marland Kitchen aspirin 81 MG EC tablet Take 2 tablets (162 mg total) by mouth daily.  Swallow whole. 60 tablet 12  . Blood Glucose Monitoring Suppl DEVI Use to check glucose daily 1 Device 0  . Blood Pressure Monitor MISC For regular home bp monitoring during pregnancy 1 each 0  . busPIRone (BUSPAR) 5 MG tablet Take by mouth.    . cyclobenzaprine (FLEXERIL) 10 MG tablet Take 1 tablet (10 mg total) by mouth at bedtime. 30 tablet 5  . diphenhydrAMINE (BENADRYL) 25 MG tablet Take 25 mg by mouth every 6 (six) hours as needed.     . Doxylamine-Pyridoxine (DICLEGIS) 10-10 MG TBEC 2 tabs q hs, if sx persist add 1 tab q am on day 3, if sx persist add 1 tab q afternoon on day 4 100 tablet 6  . glucose blood test strip Use as instructed 200 each 12  . labetalol (NORMODYNE) 100 MG tablet Take 2 tablets (200 mg total) by mouth daily. 60 tablet 5  . MELATONIN PO Take 1 tablet by mouth at bedtime as needed (for sleep).     . pantoprazole (PROTONIX) 20 MG tablet Take 1 tablet (20 mg total) by mouth daily. 90 tablet 3  . Prenatal Vit-Fe Fumarate-FA (PRENATAL VITAMIN PO) Take by mouth. 2 gummies daily     No facility-administered medications prior to visit.    Allergies  Allergen Reactions  . Amoxicillin Hives  . Banana   . Other     Nuts   . Pineapple   . Motrin [Ibuprofen] Rash    MOTRIN (Brand only) causes this allergy    ROS Review of Systems  Constitutional: Negative for chills and fever.  HENT: Negative for congestion, sinus pressure, sinus pain and sore throat.   Eyes: Negative for pain and discharge.  Respiratory: Negative for cough and shortness of breath.   Cardiovascular: Negative for chest pain and palpitations.  Gastrointestinal: Positive for nausea. Negative for abdominal pain, constipation, diarrhea and vomiting.  Endocrine: Negative for polydipsia and polyuria.  Genitourinary: Negative for dysuria and hematuria.  Musculoskeletal: Negative for neck pain and neck stiffness.  Skin: Negative for rash.  Neurological: Negative for dizziness, syncope, weakness and  numbness.  Psychiatric/Behavioral: Negative for agitation and behavioral problems.      Objective:    Physical Exam Vitals reviewed.  Constitutional:      General: She is not in acute distress.    Appearance: She is obese. She is not diaphoretic.  HENT:     Head: Normocephalic and atraumatic.     Nose: Nose normal.     Mouth/Throat:     Mouth: Mucous membranes are moist.  Eyes:     General: No scleral icterus.    Extraocular Movements: Extraocular movements intact.  Cardiovascular:     Rate and Rhythm: Normal rate and regular rhythm.  Pulses: Normal pulses.     Heart sounds: Normal heart sounds. No murmur heard.   Pulmonary:     Breath sounds: Normal breath sounds. No wheezing or rales.  Musculoskeletal:     Cervical back: Neck supple. No tenderness.     Right lower leg: No edema.     Left lower leg: No edema.  Skin:    General: Skin is warm.     Findings: No rash.  Neurological:     General: No focal deficit present.     Mental Status: She is alert and oriented to person, place, and time.  Psychiatric:        Mood and Affect: Mood normal.        Behavior: Behavior normal.     BP 109/73 (BP Location: Left Arm, Patient Position: Sitting, Cuff Size: Large)   Pulse 76   Temp 97.6 F (36.4 C) (Temporal)   Ht 5\' 1"  (1.549 m)   Wt 247 lb (112 kg)   LMP  (LMP Unknown)   SpO2 99%   BMI 46.67 kg/m  Wt Readings from Last 3 Encounters:  07/04/20 247 lb (112 kg)  07/03/20 246 lb (111.6 kg)  06/29/20 244 lb (110.7 kg)     Health Maintenance Due  Topic Date Due  . URINE MICROALBUMIN  Never done    There are no preventive care reminders to display for this patient.  Lab Results  Component Value Date   TSH 1.950 06/30/2019   Lab Results  Component Value Date   WBC 8.8 05/11/2020   HGB 11.2 05/11/2020   HCT 36.9 05/11/2020   MCV 69 (L) 05/11/2020   PLT 392 05/11/2020   Lab Results  Component Value Date   NA 134 01/24/2020   K 4.3 01/24/2020   CO2  22 01/24/2020   GLUCOSE 77 01/24/2020   BUN 6 01/24/2020   CREATININE 0.50 (L) 01/24/2020   BILITOT 0.2 01/24/2020   ALKPHOS 93 01/24/2020   AST 10 01/24/2020   ALT 11 01/24/2020   PROT 7.0 01/24/2020   ALBUMIN 4.0 01/24/2020   CALCIUM 9.3 01/24/2020   ANIONGAP 10 02/26/2019   No results found for: CHOL No results found for: HDL No results found for: LDLCALC No results found for: TRIG No results found for: CHOLHDL Lab Results  Component Value Date   HGBA1C 6.1 (H) 01/24/2020      Assessment & Plan:   Problem List Items Addressed This Visit      Cardiovascular and Mediastinum   Chronic hypertension - Primary    BP Readings from Last 1 Encounters:  07/04/20 109/73   Well-controlled with Labetalol 200 mg QD Counseled for compliance with the medications Continue to f/u with OB/GYN for HTN during pregnancy, no signs of overt eclampsia currently        Other Visit Diagnoses    Third trimester pregnancy   Follows up with OB/GYN    GERD On Pantoprazole      No orders of the defined types were placed in this encounter.   Follow-up: Return in about 6 months (around 01/03/2021) for Annual physical.    01/05/2021, MD

## 2020-07-05 ENCOUNTER — Ambulatory Visit (INDEPENDENT_AMBULATORY_CARE_PROVIDER_SITE_OTHER): Payer: Medicaid Other

## 2020-07-05 ENCOUNTER — Other Ambulatory Visit: Payer: Self-pay

## 2020-07-05 ENCOUNTER — Ambulatory Visit (INDEPENDENT_AMBULATORY_CARE_PROVIDER_SITE_OTHER): Payer: Medicaid Other | Admitting: Advanced Practice Midwife

## 2020-07-05 VITALS — BP 123/79 | HR 110 | Wt 248.0 lb

## 2020-07-05 DIAGNOSIS — O0993 Supervision of high risk pregnancy, unspecified, third trimester: Secondary | ICD-10-CM

## 2020-07-05 DIAGNOSIS — Z3A35 35 weeks gestation of pregnancy: Secondary | ICD-10-CM

## 2020-07-05 DIAGNOSIS — O10919 Unspecified pre-existing hypertension complicating pregnancy, unspecified trimester: Secondary | ICD-10-CM | POA: Diagnosis not present

## 2020-07-05 LAB — POCT URINALYSIS DIPSTICK OB
Blood, UA: NEGATIVE
Glucose, UA: NEGATIVE
Ketones, UA: NEGATIVE
Leukocytes, UA: NEGATIVE
Nitrite, UA: NEGATIVE
POC,PROTEIN,UA: NEGATIVE

## 2020-07-05 NOTE — Progress Notes (Signed)
[name] 35+2 wks,cephalic,BPP 8/8,FHR 159 bpm,posterior placenta gr 3,AFI 15.8 cm,RI .58,.53,.57=40%

## 2020-07-05 NOTE — Progress Notes (Signed)
HIGH-RISK PREGNANCY VISIT Patient name: Theresa Rosario MRN [id number]  Date of birth: 12-15-1985 Chief Complaint:   Routine Prenatal Visit  History of Present Illness:   Theresa Rosario is a 35 y.o. G51P1011 female at [redacted]w[redacted]d with an Estimated Date of Delivery: 08/07/20 being seen today for ongoing management of a high-risk pregnancy complicated by [facility] currently on Labetalol Today she reports no complaints. Contractions: Irritability. Vag. Bleeding: None.  Movement: Present. denies leaking of fluid.  Review of Systems:   Pertinent items are noted in HPI Denies abnormal vaginal discharge w/ itching/odor/irritation, headaches, visual changes, shortness of breath, chest pain, abdominal pain, severe nausea/vomiting, or problems with urination or bowel movements unless otherwise stated above. Pertinent History Reviewed:  Reviewed past medical,surgical, social, obstetrical and family history.  Reviewed problem list, medications and allergies. Physical Assessment:   Vitals:   07/05/20 0928  BP: 123/79  Pulse: (!) 110  Weight: 248 lb (112.5 kg)  Body mass index is 46.86 kg/m.           Physical Examination:   General appearance: alert, well appearing, and in no distress  Mental status: alert, oriented to person, place, and time  Skin: warm & dry   Extremities: Edema: None    Cardiovascular: normal heart rate noted  Respiratory: normal respiratory effort, no distress  Abdomen: gravid, soft, non-tender  Pelvic: Cervical exam deferred         Fetal Status:     Movement: Present    Fetal Surveillance Testing today: [name] 35+2 wks,cephalic,BPP 8/8,FHR 159 bpm,posterior placenta gr 3,AFI 15.8 cm,RI .58,.53,.57=40%   Results for orders placed or performed in visit on 07/05/20 (from the past 24 hour(s))  POC Urinalysis Dipstick OB   Collection Time: 07/05/20  9:33 AM  Result Value Ref Range   Color, UA     Clarity, UA     Glucose, UA Negative Negative   Bilirubin, UA     Ketones,  UA neg    Spec Grav, UA     Blood, UA neg    pH, UA     POC,PROTEIN,UA Negative Negative, Trace, Small (1+), Moderate (2+), Large (3+), 4+   Urobilinogen, UA     Nitrite, UA neg    Leukocytes, UA Negative Negative   Appearance     Odor      Assessment & Plan:  1) High-risk pregnancy G3P1011 at [redacted]w[redacted]d with an Estimated Date of Delivery: 08/07/20   2) CHTN, stable Treatment plan  Meds: ASA and Labetalol       Growth u/s @, 36wks    2x/wk testing nst/sono @ 32wks     Deliver 38-39wks (37wks or prn if poor control)____    Meds: No orders of the defined types were placed in this encounter.   Labs/procedures today: [name]   Reviewed: Preterm labor symptoms and general obstetric precautions including but not limited to vaginal bleeding, contractions, leaking of fluid and fetal movement were reviewed in detail with the patient.  All questions were answered. Has home bp cuff.  Follow-up: No follow-ups on file.  Future Appointments  Date Time Provider Department Center  07/05/2020 10:10 AM Jacklyn Shell, CNM [facility]  07/10/2020  2:30 PM [facility] NURSE [facility]  07/13/2020 10:30 AM [facility] [name] [facility] None  07/13/2020 11:30 AM Myna Hidalgo, DO [facility]  07/17/2020  2:30 PM [facility] N[facility]SE [facility]  07/20/2020  9:15 AM [facility] [name] [facility] None  07/20/2020 10:10 AM Cheral Marker,  CNM [facility]  07/24/2020  3:10 PM [facility] NURSE [facility]  01/03/2021  3:00 PM [name], MD [facility]    Orders Placed This Encounter  Procedures  . POC Urinalysis Dipstick OB   Jacklyn Shell DNP, CNM 07/05/2020 9:49 AM

## 2020-07-05 NOTE — Patient Instructions (Signed)
Theresa Rosario, I greatly value your feedback.  If you receive a survey following your visit with [name] today, we appreciate you taking the time to fill it out.  Thanks, Theresa Beams, DNP, CNM  [facility] HAS MOVED!!! It is now [facility] at [facility] ([address] [place], [place] [address]) Entrance located off of [address] Free 24/7 valet parking   Go to [email or web address] to register for FREE online childbirth classes    Call the office [phone]) or go to [facility] if:  You begin to have strong, frequent contractions  Your water breaks.  Sometimes it is a big gush of fluid, sometimes it is just a trickle that keeps getting your panties wet or running down your legs  You have vaginal bleeding.  It is normal to have a small amount of spotting if your cervix was checked.   You don't feel your baby moving like normal.  If you don't, get you something to eat and drink and lay down and focus on feeling your baby move.  You should feel at least 10 movements in 2 hours.  If you don't, you should call the office or go to [facility].   Home Blood Pressure Monitoring for Patients   Your provider has recommended that you check your blood pressure (BP) at least once a week at home. If you do not have a blood pressure cuff at home, one will be provided for you. Contact your provider if you have not received your monitor within 1 week.   Helpful Tips for Accurate Home Blood Pressure Checks  . Don't smoke, exercise, or drink caffeine 30 minutes before checking your BP . Use the restroom before checking your BP (a full bladder can raise your pressure) . Relax in a comfortable upright chair . Feet on the ground . Left arm resting comfortably on a flat surface at the level of your heart . Legs uncrossed . Back supported . Sit quietly and don't talk . Place the cuff on your bare arm . Adjust snuggly, so that only two fingertips can  fit between your skin and the top of the cuff . Check 2 readings separated by at least one minute . Keep a log of your BP readings . For a visual, please reference this diagram: http://ccnc.care/bpdiagram  Provider Name: [facility]     Phone: [phone]  Zone 1: ALL CLEAR  Continue to monitor your symptoms:  . BP reading is less than 140 (top number) or less than 90 (bottom number)  . No right upper stomach pain . No headaches or seeing spots . No feeling nauseated or throwing up . No swelling in face and hands  Zone 2: CAUTION Call your doctor's office for any of the following:  . BP reading is greater than 140 (top number) or greater than 90 (bottom number)  . Stomach pain under your ribs in the middle or right side . Headaches or seeing spots . Feeling nauseated or throwing up . Swelling in face and hands  Zone 3: EMERGENCY  Seek immediate medical care if you have any of the following:  . BP reading is greater than160 (top number) or greater than 110 (bottom number) . Severe headaches not improving with Tylenol . Serious difficulty catching your breath . Any worsening symptoms from Zone 2

## 2020-07-09 DIAGNOSIS — Z20828 Contact with and (suspected) exposure to other viral communicable diseases: Secondary | ICD-10-CM | POA: Diagnosis not present

## 2020-07-10 ENCOUNTER — Ambulatory Visit (INDEPENDENT_AMBULATORY_CARE_PROVIDER_SITE_OTHER): Payer: Medicaid Other | Admitting: *Deleted

## 2020-07-10 ENCOUNTER — Other Ambulatory Visit: Payer: Self-pay

## 2020-07-10 DIAGNOSIS — I10 Essential (primary) hypertension: Secondary | ICD-10-CM

## 2020-07-10 DIAGNOSIS — O0993 Supervision of high risk pregnancy, unspecified, third trimester: Secondary | ICD-10-CM | POA: Diagnosis not present

## 2020-07-10 NOTE — Progress Notes (Addendum)
   NURSE VISIT- NST  SUBJECTIVE:  Theresa Rosario is a 35 y.o. G41P1011 female at [redacted]w[redacted]d, here for a NST for pregnancy complicated by [facility].  She reports active fetal movement, contractions: none, vaginal bleeding: none, membranes: intact.   OBJECTIVE:  BP 135/84   Pulse 97   Wt 245 lb (111.1 kg)   LMP  (LMP Unknown)   BMI 46.29 kg/m   Appears well, no apparent distress  No results found for this or any previous visit (from the past 24 hour(s)).  NST: FHR baseline 145 bpm, Variability: moderate, Accelerations:present, Decelerations:  Absent= Cat 1/reactive Toco: none   ASSESSMENT: G3P1011 at [redacted]w[redacted]d with CHTN NST reactive  PLAN: EFM strip reviewed by Dr. Despina Hidden   Recommendations: keep next appointment as scheduled    Jobe Marker  07/10/2020 4:26 PM  Attestation of Attending Supervision of Nursing Visit Encounter: Evaluation and management procedures were performed by the nursing staff under my supervision and collaboration.  I have reviewed the nurse's note and chart, and I agree with the management and plan.  Rockne Coons MD Attending Physician for the Center for [facility] 07/10/2020 5:03 PM

## 2020-07-12 ENCOUNTER — Other Ambulatory Visit: Payer: Self-pay | Admitting: Advanced Practice Midwife

## 2020-07-12 DIAGNOSIS — I10 Essential (primary) hypertension: Secondary | ICD-10-CM

## 2020-07-12 DIAGNOSIS — O0992 Supervision of high risk pregnancy, unspecified, second trimester: Secondary | ICD-10-CM

## 2020-07-13 ENCOUNTER — Ambulatory Visit (INDEPENDENT_AMBULATORY_CARE_PROVIDER_SITE_OTHER): Payer: Medicaid Other

## 2020-07-13 ENCOUNTER — Encounter: Payer: Self-pay | Admitting: *Deleted

## 2020-07-13 ENCOUNTER — Ambulatory Visit (INDEPENDENT_AMBULATORY_CARE_PROVIDER_SITE_OTHER): Payer: Medicaid Other | Admitting: Obstetrics & Gynecology

## 2020-07-13 ENCOUNTER — Encounter: Payer: Self-pay | Admitting: Obstetrics & Gynecology

## 2020-07-13 ENCOUNTER — Other Ambulatory Visit: Payer: Self-pay

## 2020-07-13 VITALS — BP 137/86 | HR 102

## 2020-07-13 DIAGNOSIS — O0992 Supervision of high risk pregnancy, unspecified, second trimester: Secondary | ICD-10-CM

## 2020-07-13 DIAGNOSIS — I10 Essential (primary) hypertension: Secondary | ICD-10-CM | POA: Diagnosis not present

## 2020-07-13 DIAGNOSIS — Z3A36 36 weeks gestation of pregnancy: Secondary | ICD-10-CM

## 2020-07-13 DIAGNOSIS — O0993 Supervision of high risk pregnancy, unspecified, third trimester: Secondary | ICD-10-CM

## 2020-07-13 DIAGNOSIS — O10913 Unspecified pre-existing hypertension complicating pregnancy, third trimester: Secondary | ICD-10-CM | POA: Diagnosis not present

## 2020-07-13 LAB — POCT URINALYSIS DIPSTICK OB
Blood, UA: NEGATIVE
Glucose, UA: NEGATIVE
Ketones, UA: NEGATIVE
Leukocytes, UA: NEGATIVE
Nitrite, UA: NEGATIVE
POC,PROTEIN,UA: NEGATIVE

## 2020-07-13 NOTE — Progress Notes (Signed)
HIGH-RISK PREGNANCY VISIT Patient name: Theresa Rosario MRN [id number]  Date of birth: 24-Mar-1986 Chief Complaint:   Routine Prenatal Visit (BPP)  History of Present Illness:   Theresa Rosario is a 35 y.o. G63P1011 female at [redacted]w[redacted]d with an Estimated Date of Delivery: 08/07/20 being seen today for ongoing management of a high-risk pregnancy complicated by -chronic hypertension currently on Labetalol -Anxiety on Buspar  Today she reports constipation- she has not had a BM in 10 days.  Taking Colace 2x daily for the past 2 days  Contractions: Irregular. Vag. Bleeding: None.  Movement: Present. denies leaking of fluid.   Depression screen [facility] 2/9 07/04/2020 05/11/2020 01/24/2020 01/04/2020 07/07/2019  Decreased Interest 0 0 0 0 0  Down, Depressed, Hopeless 0 0 0 0 0  PHQ - 2 Score 0 0 0 0 0  Altered sleeping - 2 0 - -  Tired, decreased energy - 1 2 - -  Change in appetite - 0 0 - -  Feeling bad or failure about yourself  - 0 0 - -  Trouble concentrating - 0 0 - -  Moving slowly or fidgety/restless - 0 0 - -  Suicidal thoughts - 0 0 - -  PHQ-9 Score - 3 2 - -     Current Outpatient Medications  Medication Instructions  . Accu-Chek Softclix Lancets lancets Use as instructed  . acetaminophen (TYLENOL) 500-1,000 mg, Oral, Every 8 hours PRN  . aspirin 162 mg, Oral, Daily, Swallow whole.  . Blood Glucose Monitoring Suppl DEVI Use to check glucose daily  . Blood Pressure Monitor MISC For regular home bp monitoring during pregnancy  . busPIRone (BUSPAR) 5 MG tablet Oral  . cyclobenzaprine (FLEXERIL) 10 mg, Oral, Daily at bedtime  . diphenhydrAMINE (BENADRYL) 25 mg, Every 6 hours PRN  . docusate sodium (COLACE) 100 mg, Oral, 2 times daily  . Doxylamine-Pyridoxine (DICLEGIS) 10-10 MG TBEC 2 tabs q hs, if sx persist add 1 tab q am on day 3, if sx persist add 1 tab q afternoon on day 4  . glucose blood test strip Use as instructed  . labetalol (NORMODYNE) 200 mg, Oral, Daily  .  MELATONIN PO 1 tablet, Oral, At bedtime PRN  . pantoprazole (PROTONIX) 20 mg, Oral, Daily  . Prenatal Vit-Fe Fumarate-FA (PRENATAL VITAMIN PO) Oral, 2 gummies daily      Review of Systems:   Pertinent items are noted in HPI Denies abnormal vaginal discharge w/ itching/odor/irritation, headaches, visual changes, shortness of breath, chest pain, abdominal pain, severe nausea/vomiting, or problems with urination. Pertinent History Reviewed:  Reviewed past medical,surgical, social, obstetrical and family history.  Reviewed problem list, medications and allergies. Physical Assessment:   Vitals:   07/13/20 1115  BP: 137/86  Pulse: (!) 102  There is no height or weight on file to calculate BMI.           Physical Examination:   General appearance: alert, well appearing, and in no distress  Mental status: alert, oriented to person, place, and time  Skin: warm & dry   Extremities: Edema: None    Cardiovascular: normal heart rate noted  Respiratory: normal respiratory effort, no distress  Abdomen: gravid, soft, non-tender  Pelvic: on vaginal exam- compact large amount of stool appreciated        Fetal Status:     Movement: Present  [name] today- FHR 148  Fetal Surveillance Testing today: BPP with dopplers cephalic,BPP 8/8,FHR 148 bpm,posterior placenta gr 3,AFI 18.9 cm,RI .53,.56,.51,.45=25%,EFW 3031 g  63%   Chaperone: Jobe Marker    Results for orders placed or performed in visit on 07/13/20 (from the past 24 hour(s))  POC Urinalysis Dipstick OB   Collection Time: 07/13/20 11:21 AM  Result Value Ref Range   Color, UA     Clarity, UA     Glucose, UA Negative Negative   Bilirubin, UA     Ketones, UA neg    Spec Grav, UA     Blood, UA neg    pH, UA     POC,PROTEIN,UA Negative Negative, Trace, Small (1+), Moderate (2+), Large (3+), 4+   Urobilinogen, UA     Nitrite, UA neg    Leukocytes, UA Negative Negative   Appearance     Odor       Assessment & Plan:  High-risk  pregnancy: G3P1011 at [redacted]w[redacted]d with an Estimated Date of Delivery: 08/07/20   1) Chronic HTN- on Labetalol -BP stable -antepartum testing twice weekly -BPP today reactive, EFW:  3031 g 63%  2) Constipation -encouraged enema and/or suppository -lots of hydration -continue stool softener  -Anxiety- continue medication -h/o Trich, TOC negative   Meds: No orders of the defined types were placed in this encounter.   Labs/procedures today: GBS, BPP with growth  Treatment Plan:  As above, plan for IOL @ 38wks  Reviewed: Term labor symptoms and general obstetric precautions including but not limited to vaginal bleeding, contractions, leaking of fluid and fetal movement were reviewed in detail with the patient.  All questions were answered. Pt has home bp cuff. Check bp weekly, let us know if >140/90.   Follow-up: Return in about 1 week (around 07/20/2020) for HROB visit- as scheduled with antepartum testing.   Future Appointments  Date Time Provider Department Center  07/17/2020  2:30 PM [facility] NURSE [facility]  07/20/2020  9:15 AM [facility] [name] [facility] None  07/20/2020 10:10 AM Cheral Marker, CNM [facility]  07/24/2020  3:10 PM [facility] NURSE [facility]  01/03/2021  3:00 PM [name], MD [facility] RPC    Orders Placed This Encounter  Procedures  . Strep Gp B NAA+Rflx  . POC Urinalysis Dipstick OB    Myna Hidalgo, DO Attending Obstetrician & Gynecologist, [facility] for [employer], [facility]

## 2020-07-13 NOTE — Progress Notes (Signed)
[name] 36+3 wks,cephalic,BPP 8/8,FHR 148 bpm,posterior placenta gr 3,AFI 18.9 cm,RI .53,.56,.51,.45=25%,EFW 3031 g 63%

## 2020-07-15 ENCOUNTER — Other Ambulatory Visit: Payer: Self-pay | Admitting: Advanced Practice Midwife

## 2020-07-16 DIAGNOSIS — Z20828 Contact with and (suspected) exposure to other viral communicable diseases: Secondary | ICD-10-CM | POA: Diagnosis not present

## 2020-07-17 ENCOUNTER — Other Ambulatory Visit: Payer: Self-pay

## 2020-07-17 ENCOUNTER — Telehealth (HOSPITAL_COMMUNITY): Payer: Self-pay | Admitting: *Deleted

## 2020-07-17 ENCOUNTER — Ambulatory Visit (INDEPENDENT_AMBULATORY_CARE_PROVIDER_SITE_OTHER): Payer: Medicaid Other | Admitting: *Deleted

## 2020-07-17 ENCOUNTER — Encounter (HOSPITAL_COMMUNITY): Payer: Self-pay | Admitting: *Deleted

## 2020-07-17 VITALS — BP 118/83 | HR 97 | Wt 244.0 lb

## 2020-07-17 DIAGNOSIS — Z3A37 37 weeks gestation of pregnancy: Secondary | ICD-10-CM

## 2020-07-17 DIAGNOSIS — O0993 Supervision of high risk pregnancy, unspecified, third trimester: Secondary | ICD-10-CM | POA: Diagnosis not present

## 2020-07-17 DIAGNOSIS — I10 Essential (primary) hypertension: Secondary | ICD-10-CM | POA: Diagnosis not present

## 2020-07-17 LAB — POCT URINALYSIS DIPSTICK OB
Blood, UA: NEGATIVE
Glucose, UA: NEGATIVE
Ketones, UA: NEGATIVE
Leukocytes, UA: NEGATIVE
Nitrite, UA: NEGATIVE
POC,PROTEIN,UA: NEGATIVE

## 2020-07-17 NOTE — Telephone Encounter (Signed)
Preadmission screen  

## 2020-07-17 NOTE — Progress Notes (Addendum)
   NURSE VISIT- NST  SUBJECTIVE:  Theresa Rosario is a 35 y.o. G75P1011 female at [redacted]w[redacted]d, here for a NST for pregnancy complicated by [facility].  She reports active fetal movement, contractions: none, vaginal bleeding: none, membranes: intact.   OBJECTIVE:  BP 118/83   Pulse 97   Wt 244 lb (110.7 kg)   LMP  (LMP Unknown)   BMI 46.10 kg/m   Appears well, no apparent distress  Results for orders placed or performed in visit on 07/17/20 (from the past 24 hour(s))  POC Urinalysis Dipstick OB   Collection Time: 07/17/20  2:45 PM  Result Value Ref Range   Color, UA     Clarity, UA     Glucose, UA Negative Negative   Bilirubin, UA     Ketones, UA neg    Spec Grav, UA     Blood, UA neg    pH, UA     POC,PROTEIN,UA Negative Negative, Trace, Small (1+), Moderate (2+), Large (3+), 4+   Urobilinogen, UA     Nitrite, UA neg    Leukocytes, UA Negative Negative   Appearance     Odor      NST: FHR baseline 145 bpm, Variability: moderate, Accelerations:present, Decelerations:  Absent= Cat 1/reactive Toco: none   ASSESSMENT: G3P1011 at [redacted]w[redacted]d with CHTN NST reactive  PLAN: EFM strip reviewed by Dr. Despina Hidden   Recommendations: keep next appointment as scheduled    Jobe Marker  07/17/2020 4:17 PM  Attestation of Attending Supervision of Nursing Visit Encounter: Evaluation and management procedures were performed by the nursing staff under my supervision and collaboration.  I have reviewed the nurse's note and chart, and I agree with the management and plan.  Rockne Coons MD Attending Physician for the Center for [facility] 07/18/2020 8:19 AM

## 2020-07-18 LAB — STREP GP B SUSCEPTIBILITY

## 2020-07-18 LAB — STREP GP B NAA+RFLX: Strep Gp B NAA+Rflx: POSITIVE — AB

## 2020-07-19 ENCOUNTER — Other Ambulatory Visit: Payer: Self-pay | Admitting: Obstetrics & Gynecology

## 2020-07-19 ENCOUNTER — Encounter: Payer: Medicaid Other | Admitting: Women's Health

## 2020-07-19 DIAGNOSIS — O10919 Unspecified pre-existing hypertension complicating pregnancy, unspecified trimester: Secondary | ICD-10-CM

## 2020-07-20 ENCOUNTER — Ambulatory Visit (INDEPENDENT_AMBULATORY_CARE_PROVIDER_SITE_OTHER): Payer: Medicaid Other | Admitting: Women's Health

## 2020-07-20 ENCOUNTER — Other Ambulatory Visit (HOSPITAL_COMMUNITY)
Admission: RE | Admit: 2020-07-20 | Discharge: 2020-07-20 | Disposition: A | Discharge status: Home / Self Care | Payer: Medicaid Other | Source: Ambulatory Visit | Attending: Obstetrics & Gynecology | Admitting: Obstetrics & Gynecology

## 2020-07-20 ENCOUNTER — Encounter: Payer: Self-pay | Admitting: Women's Health

## 2020-07-20 ENCOUNTER — Ambulatory Visit (INDEPENDENT_AMBULATORY_CARE_PROVIDER_SITE_OTHER): Payer: Medicaid Other

## 2020-07-20 ENCOUNTER — Other Ambulatory Visit: Payer: Self-pay

## 2020-07-20 VITALS — BP 136/86 | HR 96 | Wt 246.0 lb

## 2020-07-20 DIAGNOSIS — Z3A37 37 weeks gestation of pregnancy: Secondary | ICD-10-CM

## 2020-07-20 DIAGNOSIS — O10919 Unspecified pre-existing hypertension complicating pregnancy, unspecified trimester: Secondary | ICD-10-CM | POA: Diagnosis not present

## 2020-07-20 DIAGNOSIS — O0993 Supervision of high risk pregnancy, unspecified, third trimester: Secondary | ICD-10-CM | POA: Diagnosis not present

## 2020-07-20 DIAGNOSIS — I10 Essential (primary) hypertension: Secondary | ICD-10-CM

## 2020-07-20 LAB — POCT URINALYSIS DIPSTICK OB
Glucose, UA: NEGATIVE
Ketones, UA: NEGATIVE
Nitrite, UA: NEGATIVE
POC,PROTEIN,UA: NEGATIVE

## 2020-07-20 NOTE — Patient Instructions (Signed)
Theresa Rosario, I greatly value your feedback.  If you receive a survey following your visit with [name] today, we appreciate you taking the time to fill it out.  Thanks, Theresa Rosario, CNM, WHNP-BC  [facility] at [facility] ([address] [place], [place] [address]) Entrance C, located off of [address] valet parking   Go to [email or web address] to register for FREE online childbirth classes    Call the office [phone]) or go to [facility] if:  You begin to have strong, frequent contractions  Your water breaks.  Sometimes it is a big gush of fluid, sometimes it is just a trickle that keeps getting your panties wet or running down your legs  You have vaginal bleeding.  It is normal to have a small amount of spotting if your cervix was checked.   You don't feel your baby moving like normal.  If you don't, get you something to eat and drink and lay down and focus on feeling your baby move.  You should feel at least 10 movements in 2 hours.  If you don't, you should call the office or go to [facility].   Call the office [phone]) or go to [facility] hospital for these signs of pre-eclampsia:  Severe headache that does not go away with Tylenol  Visual changes- seeing spots, double, blurred vision  Pain under your right breast or upper abdomen that does not go away with Tums or heartburn medicine  Nausea and/or vomiting  Severe swelling in your hands, feet, and face    Home Blood Pressure Monitoring for Patients   Your provider has recommended that you check your blood pressure (BP) at least once a week at home. If you do not have a blood pressure cuff at home, one will be provided for you. Contact your provider if you have not received your monitor within 1 week.   Helpful Tips for Accurate Home Blood Pressure Checks  . Don't smoke, exercise, or drink caffeine 30 minutes before checking your BP . Use the restroom before checking your BP (a full  bladder can raise your pressure) . Relax in a comfortable upright chair . Feet on the ground . Left arm resting comfortably on a flat surface at the level of your heart . Legs uncrossed . Back supported . Sit quietly and don't talk . Place the cuff on your bare arm . Adjust snuggly, so that only two fingertips can fit between your skin and the top of the cuff . Check 2 readings separated by at least one minute . Keep a log of your BP readings . For a visual, please reference this diagram: http://ccnc.care/bpdiagram  Provider Name: [facility]     Phone: [phone]  Zone 1: ALL CLEAR  Continue to monitor your symptoms:  . BP reading is less than 140 (top number) or less than 90 (bottom number)  . No right upper stomach pain . No headaches or seeing spots . No feeling nauseated or throwing up . No swelling in face and hands  Zone 2: CAUTION Call your doctor's office for any of the following:  . BP reading is greater than 140 (top number) or greater than 90 (bottom number)  . Stomach pain under your ribs in the middle or right side . Headaches or seeing spots . Feeling nauseated or throwing up . Swelling in face and hands  Zone 3: EMERGENCY  Seek immediate medical care if you have any of  the following:  . BP reading is greater than160 (top number) or greater than 110 (bottom number) . Severe headaches not improving with Tylenol . Serious difficulty catching your breath . Any worsening symptoms from Zone 2   Braxton Hicks Contractions Contractions of the uterus can occur throughout pregnancy, but they are not always a sign that you are in labor. You may have practice contractions called Braxton Hicks contractions. These false labor contractions are sometimes confused with true labor. What are Braxton Hicks contractions? Braxton Hicks contractions are tightening movements that occur in the muscles of the uterus before labor. Unlike true labor contractions, these  contractions do not result in opening (dilation) and thinning of the cervix. Toward the end of pregnancy (32-34 weeks), Braxton Hicks contractions can happen more often and may become stronger. These contractions are sometimes difficult to tell apart from true labor because they can be very uncomfortable. You should not feel embarrassed if you go to the hospital with false labor. Sometimes, the only way to tell if you are in true labor is for your health care provider to look for changes in the cervix. The health care provider will do a physical exam and may monitor your contractions. If you are not in true labor, the exam should show that your cervix is not dilating and your water has not broken. If there are no other health problems associated with your pregnancy, it is completely safe for you to be sent home with false labor. You may continue to have Braxton Hicks contractions until you go into true labor. How to tell the difference between true labor and false labor True labor  Contractions last 30-70 seconds.  Contractions become very regular.  Discomfort is usually felt in the top of the uterus, and it spreads to the lower abdomen and low back.  Contractions do not go away with walking.  Contractions usually become more intense and increase in frequency.  The cervix dilates and gets thinner. False labor  Contractions are usually shorter and not as strong as true labor contractions.  Contractions are usually irregular.  Contractions are often felt in the front of the lower abdomen and in the groin.  Contractions may go away when you walk around or change positions while lying down.  Contractions get weaker and are shorter-lasting as time goes on.  The cervix usually does not dilate or become thin. Follow these instructions at home:  1. Take over-the-counter and prescription medicines only as told by your health care provider. 2. Keep up with your usual exercises and follow other  instructions from your health care provider. 3. Eat and drink lightly if you think you are going into labor. 4. If Braxton Hicks contractions are making you uncomfortable: ? Change your position from lying down or resting to walking, or change from walking to resting. ? Sit and rest in a tub of warm water. ? Drink enough fluid to keep your urine pale yellow. Dehydration may cause these contractions. ? Do slow and deep breathing several times an hour. 5. Keep all follow-up prenatal visits as told by your health care provider. This is important. Contact a health care provider if:  You have a fever.  You have continuous pain in your abdomen. Get help right away if:  Your contractions become stronger, more regular, and closer together.  You have fluid leaking or gushing from your vagina.  You pass blood-tinged mucus (bloody show).  You have bleeding from your vagina.  You have low back   pain that you never had before.  You feel your baby's head pushing down and causing pelvic pressure.  Your baby is not moving inside you as much as it used to. Summary  Contractions that occur before labor are called Braxton Hicks contractions, false labor, or practice contractions.  Braxton Hicks contractions are usually shorter, weaker, farther apart, and less regular than true labor contractions. True labor contractions usually become progressively stronger and regular, and they become more frequent.  Manage discomfort from [facility] contractions by changing position, resting in a warm bath, drinking plenty of water, or practicing deep breathing. This information is not intended to replace advice given to you by your health care provider. Make sure you discuss any questions you have with your health care provider. Document Revised: 02/20/2017 Document Reviewed: 07/24/2016 Elsevier Patient Education  [place].

## 2020-07-20 NOTE — Progress Notes (Signed)
HIGH-RISK PREGNANCY VISIT Patient name: Theresa Rosario MRN [id number]  Date of birth: 1985/10/11 Chief Complaint:   High Risk Gestation ([name] today; GC/CHL)  History of Present Illness:   Theresa Rosario is a 35 y.o. G29P1011 female at [redacted]w[redacted]d with an Estimated Date of Delivery: 08/07/20 being seen today for ongoing management of a high-risk pregnancy complicated by chronic hypertension currently on Labetalol 200mg  qhs.  Today she reports no complaints.  Depression screen [facility] 2/9 07/04/2020 05/11/2020 01/24/2020 01/04/2020 07/07/2019  Decreased Interest 0 0 0 0 0  Down, Depressed, Hopeless 0 0 0 0 0  PHQ - 2 Score 0 0 0 0 0  Altered sleeping - 2 0 - -  Tired, decreased energy - 1 2 - -  Change in appetite - 0 0 - -  Feeling bad or failure about yourself  - 0 0 - -  Trouble concentrating - 0 0 - -  Moving slowly or fidgety/restless - 0 0 - -  Suicidal thoughts - 0 0 - -  PHQ-9 Score - 3 2 - -    Contractions: Irregular. Vag. Bleeding: None.  Movement: Present. denies leaking of fluid.  Review of Systems:   Pertinent items are noted in HPI Denies abnormal vaginal discharge w/ itching/odor/irritation, headaches, visual changes, shortness of breath, chest pain, abdominal pain, severe nausea/vomiting, or problems with urination or bowel movements unless otherwise stated above. Pertinent History Reviewed:  Reviewed past medical,surgical, social, obstetrical and family history.  Reviewed problem list, medications and allergies. Physical Assessment:   Vitals:   07/20/20 1003  BP: 136/86  Pulse: 96  Weight: 246 lb (111.6 kg)  Body mass index is 46.48 kg/m.           Physical Examination:   General appearance: alert, well appearing, and in no distress  Mental status: alert, oriented to person, place, and time  Skin: warm & dry   Extremities: Edema: None    Cardiovascular: normal heart rate noted  Respiratory: normal respiratory effort, no distress  Abdomen: gravid, soft,  non-tender  Pelvic: Cervical exam performed  Dilation: 1 Effacement (%): Thick Station: Ballotable  Fetal Status: Fetal Heart Rate (bpm): 138 u/s   Movement: Present Presentation: Vertex  Fetal Surveillance Testing today:  07/22/20 37+3 wks,cephalic,BPP 8/8,AFI 13.90 cm,FHR 138 BPM,RI .51,.58,.53,.50=39%  Chaperone: [name]    Results for orders placed or performed in visit on 07/20/20 (from the past 24 hour(s))  POC Urinalysis Dipstick OB   Collection Time: 07/20/20 10:06 AM  Result Value Ref Range   Color, UA     Clarity, UA     Glucose, UA Negative Negative   Bilirubin, UA     Ketones, UA neg    Spec Grav, UA     Blood, UA trace    pH, UA     POC,PROTEIN,UA Negative Negative, Trace, Small (1+), Moderate (2+), Large (3+), 4+   Urobilinogen, UA     Nitrite, UA neg    Leukocytes, UA Trace (A) Negative   Appearance     Odor      Assessment & Plan:  High-risk pregnancy: G3P1011 at [redacted]w[redacted]d with an Estimated Date of Delivery: 08/07/20   1) CHTN, stable on Labetalol 200mg  qhs, ASA  2) Anxiety, on buspar  Meds: No orders of the defined types were placed in this encounter.   Labs/procedures today: GC/CT and SVE  Treatment Plan:  IOL 5/3 as scheduled   Reviewed: Term labor symptoms and general obstetric precautions including but  not limited to vaginal bleeding, contractions, leaking of fluid and fetal movement were reviewed in detail with the patient.  All questions were answered. Does have home bp cuff. Office bp cuff given: not applicable. Check bp weekly, let us know if consistently >150 and/or >95.  Follow-up: Return for cancel 5/3 appt here.   Future Appointments  Date Time Provider Department Center  07/24/2020  6:35 AM [facility] SCHED ROOM [facility] None  01/03/2021  3:00 PM [name], MD [facility] R[facility]    Orders Placed This Encounter  Procedures  . POC Urinalysis Dipstick OB   Cheral Marker CNM, [facility] 07/20/2020 10:34 AM

## 2020-07-20 NOTE — Addendum Note (Signed)
Addended by: Colen Darling on: 07/20/2020 10:50 AM   Modules accepted: Orders

## 2020-07-20 NOTE — Progress Notes (Signed)
[name] 37+3 wks,cephalic,BPP 8/8,AFI 3.90 cm,FHR 138 BPM,RI .51,.58,.53,.50=39%

## 2020-07-22 ENCOUNTER — Inpatient Hospital Stay (HOSPITAL_COMMUNITY): Payer: Medicaid Other | Admitting: Anesthesiology

## 2020-07-22 ENCOUNTER — Inpatient Hospital Stay (HOSPITAL_COMMUNITY)
Admission: AD | Admit: 2020-07-22 | Discharge: 2020-07-25 | DRG: 807 | Disposition: A | Discharge status: Home / Self Care | Payer: Medicaid Other | Attending: Obstetrics and Gynecology | Admitting: Obstetrics and Gynecology

## 2020-07-22 ENCOUNTER — Encounter (HOSPITAL_COMMUNITY): Payer: Self-pay | Admitting: Obstetrics and Gynecology

## 2020-07-22 ENCOUNTER — Other Ambulatory Visit: Payer: Self-pay

## 2020-07-22 DIAGNOSIS — F418 Other specified anxiety disorders: Secondary | ICD-10-CM | POA: Diagnosis present

## 2020-07-22 DIAGNOSIS — Z880 Allergy status to penicillin: Secondary | ICD-10-CM

## 2020-07-22 DIAGNOSIS — F32A Depression, unspecified: Secondary | ICD-10-CM | POA: Diagnosis not present

## 2020-07-22 DIAGNOSIS — O99344 Other mental disorders complicating childbirth: Secondary | ICD-10-CM | POA: Diagnosis not present

## 2020-07-22 DIAGNOSIS — O2492 Unspecified diabetes mellitus in childbirth: Secondary | ICD-10-CM | POA: Diagnosis not present

## 2020-07-22 DIAGNOSIS — F419 Anxiety disorder, unspecified: Secondary | ICD-10-CM | POA: Diagnosis present

## 2020-07-22 DIAGNOSIS — O99824 Streptococcus B carrier state complicating childbirth: Secondary | ICD-10-CM | POA: Diagnosis present

## 2020-07-22 DIAGNOSIS — Z3A37 37 weeks gestation of pregnancy: Secondary | ICD-10-CM

## 2020-07-22 DIAGNOSIS — Z20822 Contact with and (suspected) exposure to covid-19: Secondary | ICD-10-CM | POA: Diagnosis present

## 2020-07-22 DIAGNOSIS — R7303 Prediabetes: Secondary | ICD-10-CM | POA: Diagnosis present

## 2020-07-22 DIAGNOSIS — Z3490 Encounter for supervision of normal pregnancy, unspecified, unspecified trimester: Secondary | ICD-10-CM | POA: Diagnosis present

## 2020-07-22 DIAGNOSIS — Z87891 Personal history of nicotine dependence: Secondary | ICD-10-CM

## 2020-07-22 DIAGNOSIS — O1002 Pre-existing essential hypertension complicating childbirth: Principal | ICD-10-CM | POA: Diagnosis present

## 2020-07-22 DIAGNOSIS — O1092 Unspecified pre-existing hypertension complicating childbirth: Secondary | ICD-10-CM | POA: Diagnosis not present

## 2020-07-22 DIAGNOSIS — I10 Essential (primary) hypertension: Secondary | ICD-10-CM | POA: Diagnosis present

## 2020-07-22 LAB — COMPREHENSIVE METABOLIC PANEL
ALT: 22 U/L (ref 0–44)
AST: 20 U/L (ref 15–41)
Albumin: 2.6 g/dL — ABNORMAL LOW (ref 3.5–5.0)
Alkaline Phosphatase: 161 U/L — ABNORMAL HIGH (ref 38–126)
Anion gap: 9 (ref 5–15)
BUN: 5 mg/dL — ABNORMAL LOW (ref 6–20)
CO2: 22 mmol/L (ref 22–32)
Calcium: 8.9 mg/dL (ref 8.9–10.3)
Chloride: 104 mmol/L (ref 98–111)
Creatinine, Ser: 0.56 mg/dL (ref 0.44–1.00)
GFR, Estimated: >60 mL/min (ref >60)
Glucose, Bld: 85 mg/dL (ref 70–99)
Potassium: 3.8 mmol/L (ref 3.5–5.1)
Sodium: 135 mmol/L (ref 135–145)
Total Bilirubin: 0.2 mg/dL — ABNORMAL LOW (ref 0.3–1.2)
Total Protein: 6.6 g/dL (ref 6.5–8.1)

## 2020-07-22 LAB — RESP PANEL BY RT-PCR (FLU A&B, COVID) ARPGX2
Influenza A by PCR: NEGATIVE
Influenza B by PCR: NEGATIVE
SARS Coronavirus 2 by RT PCR: NEGATIVE

## 2020-07-22 LAB — PROTEIN / CREATININE RATIO, URINE
Creatinine, Urine: 56.45 mg/dL
Protein Creatinine Ratio: 0.14 mg/mg{Cre} (ref 0.00–0.15)
Total Protein, Urine: 8 mg/dL

## 2020-07-22 LAB — CBC
HCT: 38 % (ref 36.0–46.0)
Hemoglobin: 11.6 g/dL — ABNORMAL LOW (ref 12.0–15.0)
MCH: 21.7 pg — ABNORMAL LOW (ref 26.0–34.0)
MCHC: 30.5 g/dL (ref 30.0–36.0)
MCV: 71 fL — ABNORMAL LOW (ref 80.0–100.0)
Platelets: 352 10*3/uL (ref 150–400)
RBC: 5.35 MIL/uL — ABNORMAL HIGH (ref 3.87–5.11)
RDW: 17.1 % — ABNORMAL HIGH (ref 11.5–15.5)
WBC: 9.1 10*3/uL (ref 4.0–10.5)
nRBC: 0 % (ref 0.0–0.2)

## 2020-07-22 LAB — TYPE AND SCREEN
ABO/RH(D): B POS
Antibody Screen: NEGATIVE

## 2020-07-22 MED ORDER — FENTANYL-BUPIVACAINE-NACL 0.5-0.125-0.9 MG/250ML-% EP SOLN
12.0000 mL/h | EPIDURAL | Status: DC | PRN
  Filled 2020-07-22: qty 250

## 2020-07-22 MED ORDER — VANCOMYCIN HCL IN DEXTROSE 1-5 GM/200ML-% IV SOLN
1000.0000 mg | Freq: Two times a day (BID) | INTRAVENOUS | Status: DC
  Administered 2020-07-22: 1000 mg via INTRAVENOUS
  Filled 2020-07-22: qty 200

## 2020-07-22 MED ORDER — SOD CITRATE-CITRIC ACID 500-334 MG/5ML PO SOLN: 30.0000 mL | ORAL | Status: DC | PRN

## 2020-07-22 MED ORDER — OXYCODONE-ACETAMINOPHEN 5-325 MG PO TABS: 2.0000 | ORAL_TABLET | ORAL | Status: DC | PRN

## 2020-07-22 MED ORDER — LABETALOL HCL 200 MG PO TABS: 200.0000 mg | ORAL_TABLET | Freq: Every day | ORAL | Status: DC

## 2020-07-22 MED ORDER — PHENYLEPHRINE 40 MCG/ML (10ML) SYRINGE FOR IV PUSH (FOR BLOOD PRESSURE SUPPORT): 80.0000 ug | PREFILLED_SYRINGE | INTRAVENOUS | Status: DC | PRN

## 2020-07-22 MED ORDER — FENTANYL CITRATE (PF) 100 MCG/2ML IJ SOLN
INTRAMUSCULAR | Status: AC
  Administered 2020-07-22: 100 ug via INTRAVENOUS
  Filled 2020-07-22: qty 2

## 2020-07-22 MED ORDER — FENTANYL-BUPIVACAINE-NACL 0.5-0.125-0.9 MG/250ML-% EP SOLN
EPIDURAL | Status: DC | PRN
  Administered 2020-07-22: 12 mL/h via EPIDURAL

## 2020-07-22 MED ORDER — DIPHENHYDRAMINE HCL 50 MG/ML IJ SOLN: 12.5000 mg | INTRAMUSCULAR | Status: DC | PRN

## 2020-07-22 MED ORDER — FLEET ENEMA 7-19 GM/118ML RE ENEM: 1.0000 | ENEMA | RECTAL | Status: DC | PRN

## 2020-07-22 MED ORDER — ONDANSETRON HCL 4 MG/2ML IJ SOLN: 4.0000 mg | Freq: Four times a day (QID) | INTRAMUSCULAR | Status: DC | PRN

## 2020-07-22 MED ORDER — LACTATED RINGERS IV SOLN: 500.0000 mL | Freq: Once | INTRAVENOUS | Status: DC

## 2020-07-22 MED ORDER — FENTANYL CITRATE (PF) 100 MCG/2ML IJ SOLN
100.0000 ug | INTRAMUSCULAR | Status: DC | PRN
  Administered 2020-07-22: 100 ug via INTRAVENOUS
  Filled 2020-07-22: qty 2

## 2020-07-22 MED ORDER — LIDOCAINE HCL (PF) 1 % IJ SOLN
INTRAMUSCULAR | Status: DC | PRN
  Administered 2020-07-22: 5 mL via EPIDURAL

## 2020-07-22 MED ORDER — LACTATED RINGERS IV SOLN
500.0000 mL | INTRAVENOUS | Status: DC | PRN
  Administered 2020-07-23: 500 mL via INTRAVENOUS

## 2020-07-22 MED ORDER — LIDOCAINE HCL (PF) 1 % IJ SOLN: 30.0000 mL | INTRAMUSCULAR | Status: DC | PRN

## 2020-07-22 MED ORDER — EPHEDRINE 5 MG/ML INJ: 10.0000 mg | INTRAVENOUS | Status: DC | PRN

## 2020-07-22 MED ORDER — OXYTOCIN BOLUS FROM INFUSION
333.0000 mL | Freq: Once | INTRAVENOUS | Status: AC
  Administered 2020-07-23: 333 mL via INTRAVENOUS

## 2020-07-22 MED ORDER — ACETAMINOPHEN 325 MG PO TABS: 650.0000 mg | ORAL_TABLET | ORAL | Status: DC | PRN

## 2020-07-22 MED ORDER — LACTATED RINGERS IV SOLN: INTRAVENOUS | Status: DC

## 2020-07-22 MED ORDER — OXYTOCIN-SODIUM CHLORIDE 30-0.9 UT/500ML-% IV SOLN
2.5000 [IU]/h | INTRAVENOUS | Status: DC
  Administered 2020-07-23: 2.5 [IU]/h via INTRAVENOUS
  Filled 2020-07-22: qty 500

## 2020-07-22 MED ORDER — OXYCODONE-ACETAMINOPHEN 5-325 MG PO TABS: 1.0000 | ORAL_TABLET | ORAL | Status: DC | PRN

## 2020-07-22 NOTE — H&P (Signed)
Theresa Rosario is a 35 y.o. female G3P1011 at [redacted]w[redacted]d pt of [facility] FT with hx significant for [facility] on labetalol presenting for labor evaluation. She reports contractions every 6 minutes that are becoming more painful.    Anatomy US at [redacted]w[redacted]d with normal anatomy, posterior placenta, EFW 55%tile. Most recent follow up US at [redacted]w[redacted]d with EFW 3031 g, 63 %tile.    OB History    Gravida  3   Para  1   Term  1   Preterm      AB  1   Living  1     SAB  1   IAB      Ectopic      Multiple      Live Births  1          Past Medical History:  Diagnosis Date  . Anemia 12/30/2018  . Blood in urine 10/18/2015  . Condyloma of female genitalia 10/21/2012   Subclitoral, TCA 7/31   . Depression   . Diabetes mellitus without complication (HCC)   . Fibromyalgia   . Hx of hematuria   . Hx of ovarian cyst   . Hypertension   . Irregular bleeding 12/02/2017  . Kidney stone    History of kidney stones  . Large breasts   . Lumbago 11/11/2011  . Migraine    Past Surgical History:  Procedure Laterality Date  . DENTAL SURGERY    . WISDOM TOOTH EXTRACTION Bilateral    Family History: family history includes Colon cancer (age of onset: 4) in her mother; Coronary artery disease in an other family member; Diabetes in her maternal grandmother, mother, paternal grandmother, and another family member; Heart disease in her maternal grandfather; Hypertension in her father, mother, and paternal grandmother; Parkinson's disease in her mother. Social History:  reports that she quit smoking about 10 months ago. Her smoking use included cigarettes. She has never used smokeless tobacco. She reports previous alcohol use. She reports that she does not use drugs.     Maternal Diabetes: No Genetic Screening: Normal Maternal Ultrasounds/Referrals: Normal Fetal Ultrasounds or other Referrals:  None Maternal Substance Abuse:  No Significant Maternal Medications:  Meds include: Other:  Significant Maternal  Lab Results:  Group B Strep positive Other Comments:  On labetalol, PCN allergic, GBS not sensitive to [place]  Review of Systems  Constitutional: Negative for chills, fatigue and fever.  Eyes: Negative for visual disturbance.  Respiratory: Negative for shortness of breath.   Cardiovascular: Negative for chest pain.  Gastrointestinal: Positive for abdominal pain. Negative for vomiting.  Genitourinary: Negative for difficulty urinating, dysuria, flank pain, pelvic pain, vaginal bleeding, vaginal discharge and vaginal pain.  Musculoskeletal: Positive for back pain.  Neurological: Negative for dizziness and headaches.  Psychiatric/Behavioral: Negative.    Maternal Medical History:  Reason for admission: Contractions.   Contractions: Onset was 3-5 hours ago.    Fetal activity: Perceived fetal activity is normal.    Prenatal complications: PIH.   Prenatal Complications - Diabetes: none.    Dilation: 3 (external os) Exam by:: Ginnie Smart RN Blood pressure 111/65, pulse 96, temperature 98 F (36.7 C), temperature source Oral, resp. rate 19, weight 111.6 kg, SpO2 99 %. Maternal Exam:  Uterine Assessment: Contraction strength is mild.  Contraction frequency is regular.   Abdomen: Estimated fetal weight is 63%tile at 36 weeks.   Fetal presentation: vertex  Cervix: Cervix evaluated by digital exam.     Fetal Exam Fetal Monitor Review: Mode: ultrasound.  Baseline rate: 145.  Variability: moderate (6-25 bpm).   Pattern: accelerations present and no decelerations.    Fetal State Assessment: Category I - tracings are normal.     Physical Exam Vitals and nursing note reviewed.  Constitutional:      Appearance: She is well-developed.  Cardiovascular:     Rate and Rhythm: Normal rate and regular rhythm.     Heart sounds: Normal heart sounds.  Pulmonary:     Effort: Pulmonary effort is normal.  Abdominal:     Palpations: Abdomen is soft.  Musculoskeletal:        General:  Normal range of motion.     Cervical back: Normal range of motion.  Skin:    General: Skin is warm and dry.  Neurological:     Mental Status: She is alert and oriented to person, place, and time.  Psychiatric:        Behavior: Behavior normal.        Thought Content: Thought content normal.        Judgment: Judgment normal.     Prenatal labs: ABO, Rh: B/Positive/-- (11/02 1113) Antibody: Negative (02/18 0823) Rubella: 1.51 (11/02 1113) RPR: Non Reactive (02/18 0823)  HBsAg: Negative (11/02 1113)  HIV: Non Reactive (02/18 6168)  GBS: --Lottie Dawson (04/22 1340)   Assessment/Plan: [id number] at [redacted]w[redacted]d admitted for active labor at term [facility] on labetalol GBS positive, PCN allergic  Admit to L&D May have epidural when desired Vancomycin per CDC guidelines for GBS prophylaxis Expectant management   Sharen Counter 07/22/2020, 5:53 PM

## 2020-07-22 NOTE — MAU Note (Signed)
.   Theresa Rosario is a 35 y.o. at [redacted]w[redacted]d here in MAU reporting: ctx that began last night. She states that now they are 7 minutes apart and more intense. Is  Having some bloody show. Reports good fetal movement. Denies LOF. Induction date is set for Tuesday for [facility].   Pain score: 8 Vitals:   07/22/20 1649 07/22/20 1651  BP:  (!) 129/96  Pulse: 95   Resp: 20   Temp: 98 F (36.7 C)   SpO2: 98%      FHT:159

## 2020-07-22 NOTE — Progress Notes (Addendum)
LABOR PROGRESS NOTE  Theresa Rosario is a 35 y.o. G3P1011 at [redacted]w[redacted]d  admitted for IOL-chronic HTN.   Subjective: Doing well. Epidural placed and more comfortable.   Objective: BP (!) 113/57   Pulse 73   Temp 97.7 F (36.5 C) (Oral)   Resp 20   Ht 5\' 1"  (1.549 m)   Wt 111.6 kg   LMP  (LMP Unknown)   SpO2 100%   BMI 46.48 kg/m  or  Vitals:   07/22/20 2131 07/22/20 2136 07/22/20 2140 07/22/20 2205  BP: (!) 117/58 116/64 122/62 (!) 113/57  Pulse: 76 80 78 73  Resp:      Temp:      TempSrc:      SpO2:   100%   Weight:      Height:        Cervical Exam:  Dilation: 7 Effacement (%): 90 Cervical Position: Posterior Station: -2 Presentation: Vertex Exam by:: [email or web address], RN FHT: baseline rate 150, moderate varibility, 10x10 acel, no decels Toco: contractions q3-4 minutes   Labs: Lab Results  Component Value Date   WBC 9.1 07/22/2020   HGB 11.6 (L) 07/22/2020   HCT 38.0 07/22/2020   MCV 71.0 (L) 07/22/2020   PLT 352 07/22/2020    Patient Active Problem List   Diagnosis Date Noted   Encounter for induction of labor 07/22/2020   Trichomonas infection 05/28/2020   Abnormal chromosomal and genetic finding on antenatal screening mother 02/06/2020   Supervision of high-risk pregnancy 01/24/2020   Chronic hypertension 01/24/2020   Prediabetes 01/04/2020   Insomnia 01/04/2020   Hyperpigmented skin lesion 07/07/2019   Obstructive sleep apnea of adult 06/08/2019   Chronic low back pain 06/08/2019   GERD (gastroesophageal reflux disease) 03/31/2019   Family history of colon cancer in mother 02/09/2019   Chronic migraine 08/17/2018   Fibromyalgia 07/13/2012   Depression with anxiety 07/13/2012    Assessment / Plan: 35 y.o. G3P1011 at [redacted]w[redacted]d here for IOL-CHTN on labetalol  Labor: IOL. Making appropriate change. Now s/p AROM for clear fluid at 2320. Fetal Wellbeing:  Cat 1 strip Pain Control:  Epidural  Anticipated MOD: SVD  [redacted]w[redacted]d, MS3 07/22/2020,  10:24 PM  Attestation of Supervision of Student:  I confirm that I have verified the information documented in the medical student's note and that I have also personally reperformed the history, physical exam and all medical decision making activities.  I have verified that all services and findings are accurately documented in this student's note; and I agree with management and plan as outlined in the documentation. I have also made any necessary editorial changes.  09/21/2020, MD [facility] 07/22/2020 11:26 PM

## 2020-07-22 NOTE — Anesthesia Procedure Notes (Signed)
Epidural Patient location during procedure: OB Start time: 07/22/2020 8:57 PM End time: 07/22/2020 9:11 PM  Staffing Anesthesiologist: Trevor Iha, MD Performed: anesthesiologist   Preanesthetic Checklist Completed: patient identified, IV checked, site marked, risks and benefits discussed, surgical consent, monitors and equipment checked, pre-op evaluation and timeout performed  Epidural Patient position: sitting Prep: DuraPrep and site prepped and draped Patient monitoring: continuous pulse ox and blood pressure Approach: midline Location: L3-L4 Injection technique: LOR air  Needle:  Needle type: Tuohy  Needle gauge: 17 G Needle length: 9 cm and 9 Needle insertion depth: 8 cm Catheter type: closed end flexible Catheter size: 19 Gauge Catheter at skin depth: 14 cm Test dose: negative  Assessment Events: blood not aspirated, injection not painful, no injection resistance, no paresthesia and negative IV test  Additional Notes Patient identified. Risks/Benefits/Options discussed with patient including but not limited to bleeding, infection, nerve damage, paralysis, failed block, incomplete pain control, headache, blood pressure changes, nausea, vomiting, reactions to medication both or allergic, itching and postpartum back pain. Confirmed with bedside nurse the patient's most recent platelet count. Confirmed with patient that they are not currently taking any anticoagulation, have any bleeding history or any family history of bleeding disorders. Patient expressed understanding and wished to proceed. All questions were answered. Sterile technique was used throughout the entire procedure. Please see nursing notes for vital signs. Test dose was given through epidural needle and negative prior to continuing to dose epidural or start infusion. Warning signs of high block given to the patient including shortness of breath, tingling/numbness in hands, complete motor block, or any concerning  symptoms with instructions to call for help. Patient was given instructions on fall risk and not to get out of bed. All questions and concerns addressed with instructions to call with any issues.1  Attempt (S) . Patient tolerated procedure well.

## 2020-07-22 NOTE — Anesthesia Preprocedure Evaluation (Signed)
Anesthesia Evaluation  Patient identified by MRN, date of birth, ID band Patient awake    Reviewed: Allergy & Precautions, NPO status , Patient's Chart, lab work & pertinent test results  Airway Mallampati: III  TM Distance: >3 FB Neck ROM: Full    Dental no notable dental hx. (+) Teeth Intact, Dental Advisory Given   Pulmonary sleep apnea , former smoker,    Pulmonary exam normal breath sounds clear to auscultation       Cardiovascular hypertension (cHtn), Normal cardiovascular exam Rhythm:Regular Rate:Normal     Neuro/Psych  Headaches, Depression    GI/Hepatic negative GI ROS, Neg liver ROS,   Endo/Other  diabetes  Renal/GU      Musculoskeletal  (+) Fibromyalgia -  Abdominal (+) + obese,   Peds  Hematology Lab Results      Component                Value               Date                      WBC                      9.1                 07/22/2020                HGB                      11.6 (L)            07/22/2020                HCT                      38.0                07/22/2020                MCV                      71.0 (L)            07/22/2020                PLT                      352                 07/22/2020              Anesthesia Other Findings   Reproductive/Obstetrics (+) Pregnancy                             Anesthesia Physical Anesthesia Plan  ASA: III  Anesthesia Plan: Epidural   Post-op Pain Management:    Induction:   PONV Risk Score and Plan:   Airway Management Planned:   Additional Equipment:   Intra-op Plan:   Post-operative Plan:   Informed Consent: I have reviewed the patients History and Physical, chart, labs and discussed the procedure including the risks, benefits and alternatives for the proposed anesthesia with the patient or authorized representative who has indicated his/her understanding and acceptance.       Plan Discussed  with: CRNA  Anesthesia Plan Comments: (37.5 wk G3P1 for  LEA)       Anesthesia Quick Evaluation

## 2020-07-23 ENCOUNTER — Other Ambulatory Visit (HOSPITAL_COMMUNITY): Payer: Medicaid Other

## 2020-07-23 DIAGNOSIS — O1092 Unspecified pre-existing hypertension complicating childbirth: Secondary | ICD-10-CM | POA: Diagnosis not present

## 2020-07-23 DIAGNOSIS — O99824 Streptococcus B carrier state complicating childbirth: Secondary | ICD-10-CM | POA: Diagnosis not present

## 2020-07-23 DIAGNOSIS — Z3A37 37 weeks gestation of pregnancy: Secondary | ICD-10-CM | POA: Diagnosis not present

## 2020-07-23 LAB — CERVICOVAGINAL ANCILLARY ONLY
Chlamydia: NEGATIVE
Comment: NEGATIVE
Comment: NORMAL
Neisseria Gonorrhea: NEGATIVE

## 2020-07-23 LAB — RPR: RPR Ser Ql: NONREACTIVE

## 2020-07-23 MED ORDER — IBUPROFEN 600 MG PO TABS
600.0000 mg | ORAL_TABLET | Freq: Four times a day (QID) | ORAL | Status: DC
  Filled 2020-07-23: qty 1

## 2020-07-23 MED ORDER — TETANUS-DIPHTH-ACELL PERTUSSIS 5-2.5-18.5 LF-MCG/0.5 IM SUSY: 0.5000 mL | PREFILLED_SYRINGE | Freq: Once | INTRAMUSCULAR | Status: DC

## 2020-07-23 MED ORDER — COCONUT OIL OIL: 1.0000 "application " | TOPICAL_OIL | Status: DC | PRN

## 2020-07-23 MED ORDER — SENNOSIDES-DOCUSATE SODIUM 8.6-50 MG PO TABS
2.0000 | ORAL_TABLET | Freq: Every day | ORAL | Status: DC
  Administered 2020-07-24 – 2020-07-25 (×2): 2 via ORAL
  Filled 2020-07-23 (×2): qty 2

## 2020-07-23 MED ORDER — ACETAMINOPHEN 325 MG PO TABS
650.0000 mg | ORAL_TABLET | Freq: Four times a day (QID) | ORAL | Status: DC
  Administered 2020-07-23 (×3): 650 mg via ORAL
  Filled 2020-07-23 (×3): qty 2

## 2020-07-23 MED ORDER — SIMETHICONE 80 MG PO CHEW: 80.0000 mg | CHEWABLE_TABLET | ORAL | Status: DC | PRN

## 2020-07-23 MED ORDER — WITCH HAZEL-GLYCERIN EX PADS: 1.0000 "application " | MEDICATED_PAD | CUTANEOUS | Status: DC | PRN

## 2020-07-23 MED ORDER — BENZOCAINE-MENTHOL 20-0.5 % EX AERO
1.0000 "application " | INHALATION_SPRAY | CUTANEOUS | Status: DC | PRN
  Administered 2020-07-23: 1 via TOPICAL
  Filled 2020-07-23: qty 56

## 2020-07-23 MED ORDER — DIPHENHYDRAMINE HCL 25 MG PO CAPS: 25.0000 mg | ORAL_CAPSULE | Freq: Four times a day (QID) | ORAL | Status: DC | PRN

## 2020-07-23 MED ORDER — DIBUCAINE (PERIANAL) 1 % EX OINT: 1.0000 "application " | TOPICAL_OINTMENT | CUTANEOUS | Status: DC | PRN

## 2020-07-23 MED ORDER — ONDANSETRON HCL 4 MG/2ML IJ SOLN: 4.0000 mg | INTRAMUSCULAR | Status: DC | PRN

## 2020-07-23 MED ORDER — PRENATAL MULTIVITAMIN CH
1.0000 | ORAL_TABLET | Freq: Every day | ORAL | Status: DC
  Administered 2020-07-23 – 2020-07-25 (×3): 1 via ORAL
  Filled 2020-07-23 (×3): qty 1

## 2020-07-23 MED ORDER — ONDANSETRON HCL 4 MG PO TABS: 4.0000 mg | ORAL_TABLET | ORAL | Status: DC | PRN

## 2020-07-23 MED ORDER — ACETAMINOPHEN 500 MG PO TABS
1000.0000 mg | ORAL_TABLET | Freq: Four times a day (QID) | ORAL | Status: DC
  Administered 2020-07-23 – 2020-07-25 (×6): 1000 mg via ORAL
  Filled 2020-07-23 (×6): qty 2

## 2020-07-23 NOTE — Anesthesia Postprocedure Evaluation (Signed)
Anesthesia Post Note  Patient: Theresa Rosario  Procedure(s) Performed: AN AD HOC LABOR EPIDURAL     Patient location during evaluation: Mother Baby Anesthesia Type: Epidural Level of consciousness: awake Pain management: satisfactory to patient Vital Signs Assessment: post-procedure vital signs reviewed and stable Respiratory status: spontaneous breathing Cardiovascular status: stable Anesthetic complications: no   No complications documented.  Last Vitals:  Vitals:   07/23/20 0519 07/23/20 0930  BP: 121/67 (!) 112/59  Pulse: 89 95  Resp: 18 17  Temp: 36.9 C 36.7 C  SpO2: 100%     Last Pain:  Vitals:   07/23/20 0930  TempSrc: Oral  PainSc: 0-No pain   Pain Goal:                Epidural/Spinal Function Cutaneous sensation: Normal sensation (07/23/20 0930), Patient able to flex knees: Yes (07/23/20 0930), Patient able to lift hips off bed: Yes (07/23/20 0930), Back pain beyond tenderness at insertion site: No (07/23/20 0930), Progressively worsening motor and/or sensory loss: No (07/23/20 0930), Bowel and/or bladder incontinence post epidural: No (07/23/20 0930)  Cephus Shelling

## 2020-07-23 NOTE — Discharge Summary (Signed)
Postpartum Discharge Summary   Patient Name: Theresa Rosario DOB: 03/19/1986 MRN: [id number]  Date of admission: 07/22/2020 Delivery date:07/23/2020  Delivering provider: Randa Ngo  Date of discharge: 07/23/2020  Admitting diagnosis: Encounter for induction of labor [Z34.90] Intrauterine pregnancy: [redacted]w[redacted]d    Secondary diagnosis:  Active Problems:   Depression with anxiety   Prediabetes   Chronic hypertension   Encounter for induction of labor   Vaginal delivery  Additional problems: as noted above   Discharge diagnosis: Term Pregnancy Delivered                                              Post partum procedures:none Augmentation: none Complications: None  Hospital course: Onset of Labor With Vaginal Delivery      35y.o. yo G3P1011 at 35w6das admitted in Latent Labor on 07/22/2020, several days prior to her scheduled IOL for cHTN. She progressed to complete cervical dilation s/p AROM for clear fluid. She received adequate vancomycin prior to delivery for GBS+ status. Patient had an uncomplicated labor course as follows:  Membrane Rupture Time/Date: 11:18 PM ,07/22/2020   Delivery Method:Vaginal, Spontaneous  Episiotomy: None  Lacerations:  2nd degree  Patient had an uncomplicated postpartum course.  She is ambulating, tolerating a regular diet, passing flatus, and urinating well. Patient is discharged home in stable condition on 07/25/20.  Newborn Data: Birth date:07/23/2020  Birth time:3:06 AM  Gender:Female  Living status:Living  Apgars:8 ,  Weight:3116 g   Magnesium Sulfate received: No BMZ received: No Rhophylac:N/A MMR:N/A T-DaP:Given prenatally Flu: Yes Transfusion:No  Physical exam  Vitals:   07/23/20 0519 07/23/20 0930 07/23/20 1444 07/23/20 2035  BP: 121/67 (!) 112/59 125/68 117/77  Pulse: 89 95 77 81  Resp: 18 17 18 18   Temp: 98.5 F (36.9 C) 98 F (36.7 C) 98 F (36.7 C) 97.6 F (36.4 C)  TempSrc: Oral Oral Oral Oral  SpO2: 100%   100%   Weight:      Height:       General: alert, cooperative and no distress Lochia: appropriate Uterine Fundus: firm Incision: N/A DVT Evaluation: No evidence of DVT seen on physical exam. Pre-e assessment: asymptomatic  Labs: Lab Results  Component Value Date   WBC 9.1 07/22/2020   HGB 11.6 (L) 07/22/2020   HCT 38.0 07/22/2020   MCV 71.0 (L) 07/22/2020   PLT 352 07/22/2020   CMP Latest Ref Rng & Units 07/22/2020  Glucose 70 - 99 mg/dL 85  BUN 6 - 20 mg/dL 5(L)  Creatinine 0.44 - 1.00 mg/dL 0.56  Sodium 135 - 145 mmol/L 135  Potassium 3.5 - 5.1 mmol/L 3.8  Chloride 98 - 111 mmol/L 104  CO2 22 - 32 mmol/L 22  Calcium 8.9 - 10.3 mg/dL 8.9  Total Protein 6.5 - 8.1 g/dL 6.6  Total Bilirubin 0.3 - 1.2 mg/dL 0.2(L)  Alkaline Phos 38 - 126 U/L 161(H)  AST 15 - 41 U/L 20  ALT 0 - 44 U/L 22   Edinburgh Score: Edinburgh Postnatal Depression Scale Screening Tool 07/23/2020  I have been able to laugh and see the funny side of things. 0  I have looked forward with enjoyment to things. 0  I have blamed myself unnecessarily when things went wrong. 0  I have been anxious or worried for no good reason. 0  I have felt scared  or panicky for no good reason. 0  Things have been getting on top of me. 0  I have been so unhappy that I have had difficulty sleeping. 0  I have felt sad or miserable. 0  I have been so unhappy that I have been crying. 0  The thought of harming myself has occurred to me. 0  Edinburgh Postnatal Depression Scale Total 0    After visit meds:  Allergies as of 07/25/2020      Reactions   Amoxicillin Hives   Banana Itching   Tongue itching   Motrin [ibuprofen] Rash   MOTRIN (Brand only) causes this allergy (tolerates ADVIL brand)   Other Itching   Tree nuts cause tongue itching   Pineapple Itching   Tongue itching      Medication List    STOP taking these medications   Accu-Chek Softclix Lancets lancets   aspirin 81 MG EC tablet   Blood Glucose Monitoring  Suppl Devi   Blood Pressure Monitor Misc   busPIRone 5 MG tablet Commonly known as: BUSPAR   cyclobenzaprine 10 MG tablet Commonly known as: FLEXERIL   diphenhydrAMINE 25 MG tablet Commonly known as: BENADRYL   docusate sodium 100 MG capsule Commonly known as: COLACE   Doxylamine-Pyridoxine 10-10 MG Tbec Commonly known as: Diclegis   glucose blood test strip   labetalol 100 MG tablet Commonly known as: NORMODYNE   MELATONIN PO   MILK OF MAGNESIA CONCENTRATE PO   pantoprazole 20 MG tablet Commonly known as: PROTONIX   PRENATAL VITAMIN PO   PRESCRIPTION MEDICATION   PROBIOTIC PO     TAKE these medications   acetaminophen 500 MG tablet Commonly known as: TYLENOL Take 2 tablets (1,000 mg total) by mouth every 6 (six) hours. What changed:   how much to take  when to take this  reasons to take this   amLODipine 5 MG tablet Commonly known as: NORVASC Take 1 tablet (5 mg total) by mouth daily. Start taking on: Jul 26, 2020   benzocaine-Menthol 20-0.5 % Aero Commonly known as: DERMOPLAST Apply 1 application topically as needed for irritation (perineal discomfort).   witch hazel-glycerin pad Commonly known as: TUCKS Apply 1 application topically as needed for hemorrhoids.       Discharge home in stable condition Infant Feeding: Breast  & bottle Infant Disposition:home with mother Discharge instruction: per After Visit Summary and Postpartum booklet. Activity: Advance as tolerated. Pelvic rest for 6 weeks.  Diet: routine diet Future Appointments: Future Appointments  Date Time Provider [place]  01/03/2021  3:00 PM Lindell Spar, MD [facility]   Follow up Visit: Message sent to [facility] by Dr. Astrid Drafts  Please schedule this patient for a In person postpartum visit in 6 weeks with the following provider: Any provider. Additional Postpartum F/U:Postpartum Depression checkup and BP check 1 week, 2hr gtt at postpartum appt Low risk  pregnancy complicated by: cHTN (labetalol 200 BID in pregnancy), Depression/Anxiety, Prediabetes Delivery mode:  Vaginal, Spontaneous  Anticipated Birth Control:  POPs   Renee Harder, MSN, CNM 07/25/20 11:22 AM

## 2020-07-23 NOTE — Lactation Note (Signed)
This note was copied from a baby's chart. Lactation Consultation Note Baby less than 1 hr old. Came into room to assist in latching.  Mom stated good. FOB kinda got a little upset saying "she doesn't need to BF, she needs to formula feed because her breast are to big". FOB pacing in rm. He was also upset because he wanted to hold baby STS, stating he shouldn't have to wait 1 hr to hold his baby. Asked mom if she wants LC to assist in latching, mom stated yes. LC assisted in football d/t position of breast and nipple. Baby wouldn't suckle on breast, tried to suck a couple of times but didn't appear to be too interested in BF. Mom stated she was going to mainly formula feed but she would pump some and give it to the baby. LC explained to mom that we supported what ever mom chooses to do. Encouraged mom to call for assistance or questions when arrives on MBU. Mom thanks [facility] for coming. Baby placed on mom's chest STS.   Patient Name: Theresa Rosario Date: 07/23/2020 Reason for consult: L&D Initial assessment;Early term 37-38.6wks Age:76 hours  Maternal Data    Feeding    LATCH Score                    Lactation Tools Discussed/Used    Interventions    Discharge    Consult Status Consult Status: Follow-up Date: 07/23/20 Follow-up type: In-patient    Charyl Dancer 07/23/2020, 3:59 AM

## 2020-07-23 NOTE — Social Work (Signed)
MOB was referred for history of depression and anxiety.   * Referral screened out by Clinical Social Worker because none of the following criteria appear to apply:  ~ History of anxiety/depression during this pregnancy, or of post-partum depression following prior delivery. ~ Diagnosis of anxiety and/or depression within last 3 years. CSW reviewed chart and notes a diagnosis date of 2014. OR * MOB's symptoms currently being treated with medication and/or therapy. MOB prescribed Buspar 5mg .   Please contact the Clinical Social Worker if needs arise, by [facility] request, or if MOB scores greater than 9/yes to question 10 on Edinburgh Postpartum Depression Screen.  05-30-1977, LCSWA Clinical Social Work [facility] [employer]  [phone]

## 2020-07-23 NOTE — Discharge Instructions (Signed)

## 2020-07-23 NOTE — Progress Notes (Deleted)
OB/GYN Faculty Practice Delivery Note  Theresa Rosario is a 35 y.o. G3P1011 s/p vaginal delivery at [redacted]w[redacted]d. She was admitted for start of labor but had been scheduled for induction on 07/24/20 for chronic HTN.   ROM: 3h 38m with clear fluid GBS Status: positive, adequate antibiotics prior to delivery   Maximum Maternal Temperature: 98.2 F  Labor Progress: . Pt presented to MAU on 07/22/20 for contractions and bloody show. She was admitted to the L&D floor for start of labor. She underwent AROM at 2320 with clear fluid. She progressed to complete cervical dilation at 0240 and then had an uncomplicated delivery as noted below.   Delivery Date/Time: 07/23/20 at 0306 Delivery: Called to room and patient was complete and pushing. Head delivered right OA. Loose nuchal cord x1 present. Shoulder and body delivered in usual fashion. Infant with spontaneous cry, placed on mother's abdomen, dried and stimulated. Cord clamped x 2 after 1-minute delay, and cut by father under my direct supervision. Cord blood drawn. Placenta delivered spontaneously with gentle cord traction. Fundus firm with massage and Pitocin. Labia, perineum, vagina, and cervix were inspected, 2nd degree right perineal tear s/p repair with 4-0 vicryl.   Placenta: intact, 3 vessel cord, sent to L&D Complications: none Lacerations: none EBL: 300 cc Analgesia: epidual  Postpartum Planning [ ]  message to sent to schedule follow-up  [ ]  vaccines UTD  Infant: viable female  APGARs 8 and 9  3116g  , Medical Student

## 2020-07-24 ENCOUNTER — Inpatient Hospital Stay (HOSPITAL_COMMUNITY): Payer: Medicaid Other

## 2020-07-24 ENCOUNTER — Inpatient Hospital Stay (HOSPITAL_COMMUNITY)
Admission: AD | Admit: 2020-07-24 | Payer: Medicaid Other | Source: Home / Self Care | Admitting: Obstetrics & Gynecology

## 2020-07-24 ENCOUNTER — Other Ambulatory Visit: Payer: Medicaid Other

## 2020-07-24 DIAGNOSIS — I10 Essential (primary) hypertension: Secondary | ICD-10-CM | POA: Diagnosis present

## 2020-07-24 DIAGNOSIS — O0990 Supervision of high risk pregnancy, unspecified, unspecified trimester: Secondary | ICD-10-CM

## 2020-07-24 MED ORDER — LIDOCAINE 5 % EX PTCH
1.0000 | MEDICATED_PATCH | CUTANEOUS | Status: DC
  Administered 2020-07-24: 1 via TRANSDERMAL
  Filled 2020-07-24 (×2): qty 1

## 2020-07-24 MED ORDER — AMLODIPINE BESYLATE 5 MG PO TABS
5.0000 mg | ORAL_TABLET | Freq: Every day | ORAL | Status: DC
  Administered 2020-07-24: 5 mg via ORAL
  Filled 2020-07-24 (×2): qty 1

## 2020-07-24 MED ORDER — OXYCODONE HCL 5 MG PO TABS
5.0000 mg | ORAL_TABLET | Freq: Once | ORAL | Status: AC
  Administered 2020-07-24: 5 mg via ORAL
  Filled 2020-07-24: qty 1

## 2020-07-24 NOTE — Progress Notes (Signed)
Post Partum Day 1 Subjective: no complaints, up ad lib, voiding and tolerating PO, small lochia, plans to breastfeed, plans to bottle feed, oral progesterone-only contraceptive  Objective: Blood pressure 129/68, pulse 74, temperature 98.2 F (36.8 C), temperature source Oral, resp. rate 18, height 5\' 1"  (1.549 m), weight 111.6 kg, SpO2 100 %.  Physical Exam:  General: alert, cooperative and no distress Lochia:normal flow Chest: CTAB Heart: RRR no m/r/g Abdomen: +BS, soft, nontender,  Uterine Fundus: firm DVT Evaluation: No evidence of DVT seen on physical exam. Extremities: trace edema  Recent Labs    07/22/20 1817  HGB 11.6*  HCT 38.0    Assessment/Plan: Plan for discharge tomorrow   LOS: 2 days   09/21/20 07/24/2020, 8:03 AM

## 2020-07-24 NOTE — Progress Notes (Signed)
Circumcision Consent  Discussed with mom at bedside about circumcision.   Circumcision is a surgery that removes the skin that covers the tip of the penis, called the "foreskin." Circumcision is usually done when a boy is between 1 and 10 days old, sometimes up to 3-4 weeks old.  The most common reasons boys are circumcised include for cultural/religious beliefs or for parental preference (potentially easier to clean, so baby looks like daddy, etc).  There may be some medical benefits for circumcision:   Circumcised boys seem to have slightly lower rates of: ? Urinary tract infections (per the American Academy of Pediatrics an uncircumcised boy has a 1/100 chance of developing a UTI in the first year of life, a circumcised boy at a 03/998 chance of developing a UTI in the first year of life- a 10% reduction) ? Penis cancer (typically rare- an uncircumcised female has a 1 in 100,000 chance of developing cancer of the penis) ? Sexually transmitted infection (in endemic areas, including HIV, HPV and Herpes- circumcision does NOT protect against gonorrhea, chlamydia, trachomatis, or syphilis) ? Phimosis: a condition where that makes retraction of the foreskin over the glans impossible (0.4 per 1000 boys per year or 0.6% of boys are affected by their 15th birthday)  Boys and men who are not circumcised can reduce these extra risks by: ? Cleaning their penis well ? Using condoms during sex  What are the risks of circumcision?  As with any surgical procedure, there are risks and complications. In circumcision, complications are rare and usually minor, the most common being: ? Bleeding- risk is reduced by holding each clamp for 30 seconds prior to a cut being made, and by holding pressure after the procedure is done ? Infection- the penis is cleaned prior to the procedure, and the procedure is done under sterile technique ? Damage to the urethra or amputation of the penis  How is circumcision done  in baby boys?  The baby will be placed on a special table and the legs restrained for their safety. Numbing medication is injected into the penis, and the skin is cleansed with betadine to decrease the risk of infection.   What to expect:  The penis will look red and raw for 5-7 days as it heals. We expect scabbing around where the cut was made, as well as clear-pink fluid and some swelling of the penis right after the procedure. If your baby's circumcision starts to bleed or develops pus, please contact your pediatrician immediately.  All questions were answered and mother consented.  Lindey Renzulli Cresenzo-Dish[facility] Obstetrics Fellow  

## 2020-07-24 NOTE — Lactation Note (Signed)
This note was copied from a baby's chart. Lactation Consultation Note  Patient Name: Boy Karianna Gusman SELTR'V Date: 07/24/2020 Reason for consult: Follow-up assessment;Early term 37-38.6wks Age:35 hours  1st follow-up lactation visit on the postpartum floor (lactation order not put in until 0755 today). Mom is primarily formula feeding, but has also pumped 3 times with the DEBP. She has been using a size 27 flange. On breast exam, it is apparent that she needs size 21 flanges, which were provided.  Hand expression was taught to Mom & parents were pleased at how quickly they saw drops. Some time was spent with helping Mom to do hand expression herself. She may need review.   I encouraged Mom to pump whenever infant receives a bottle of formula. Dad is not a proponent of feeding at the breast, but he is OK with Mom feeding pumped breast milk.  Parents were shown how to separate breast pump parts for proper cleaning and how to assemble the manual pump included in the DEBP kit. Mom will be receiving a [employer] pump. She is aware that [employer] does make size 21 flanges in case that size is not included when it arrives.   Mom says she is on Facebook. I showed her the FB page for [employer]. Parents were thankful for my time & they know they can call for me to return if they have anymore questions.  Breast milk storage guidelines were also reviewed. Mom made aware of O/P services, community resources, and our phone # for post-discharge questions.    Matthias Hughs [facility] 07/24/2020, 9:29 AM

## 2020-07-25 ENCOUNTER — Other Ambulatory Visit (HOSPITAL_COMMUNITY): Payer: Self-pay

## 2020-07-25 MED ORDER — ACETAMINOPHEN 500 MG PO TABS
1000.0000 mg | ORAL_TABLET | Freq: Four times a day (QID) | ORAL | 0 refills | Status: AC
  Filled 2020-07-25: qty 30, 4d supply, fill #0

## 2020-07-25 MED ORDER — AMLODIPINE BESYLATE 5 MG PO TABS
5.0000 mg | ORAL_TABLET | Freq: Every day | ORAL | 3 refills | Status: DC
  Filled 2020-07-25: qty 30, 30d supply, fill #0

## 2020-07-25 MED ORDER — WITCH HAZEL-GLYCERIN EX PADS
1.0000 "application " | MEDICATED_PAD | CUTANEOUS | 12 refills | Status: DC | PRN
  Filled 2020-07-25: qty 40, fill #0

## 2020-07-25 MED ORDER — BENZOCAINE-MENTHOL 20-0.5 % EX AERO
1.0000 "application " | INHALATION_SPRAY | CUTANEOUS | 0 refills | Status: DC | PRN
  Filled 2020-07-25: qty 78, fill #0

## 2020-07-26 ENCOUNTER — Telehealth: Payer: Self-pay | Admitting: *Deleted

## 2020-07-26 NOTE — Telephone Encounter (Signed)
Transition Care Management Follow-up Telephone Call  Date of discharge and from where: 07/25/2020 - [facility]  How have you been since you were released from the hospital? "I am fine"  Any questions or concerns? No  Items Reviewed:  Did the pt receive and understand the discharge instructions provided? Yes   Medications obtained and verified? Yes   Other? No   Any new allergies since your discharge? No   Dietary orders reviewed? No  Do you have support at home? Yes    Functional Questionnaire: (I = Independent and D = Dependent) ADLs: I  Bathing/Dressing- I  Meal Prep- I  Eating- I  Maintaining continence- I  Transferring/Ambulation- I  Managing Meds- I  Follow up appointments reviewed:   PCP Hospital f/u appt confirmed? No  [facility] f/u appt confirmed? Yes  Scheduled to see OBGYN on 07/30/2020 @ 1050, 08/14/2020 @ 1150 and 08/29/2020 @ 1330.  Are transportation arrangements needed? No   If their condition worsens, is the pt aware to call PCP or go to the Emergency Dept.? Yes  Was the patient provided with contact information for the PCP's office or ED? Yes  Was to pt encouraged to call back with questions or concerns? Yes

## 2020-07-30 ENCOUNTER — Telehealth (INDEPENDENT_AMBULATORY_CARE_PROVIDER_SITE_OTHER): Payer: Medicaid Other | Admitting: *Deleted

## 2020-07-30 DIAGNOSIS — I10 Essential (primary) hypertension: Secondary | ICD-10-CM

## 2020-07-30 DIAGNOSIS — O1093 Unspecified pre-existing hypertension complicating the puerperium: Secondary | ICD-10-CM

## 2020-07-30 NOTE — Progress Notes (Addendum)
   NURSE VISIT- BLOOD PRESSURE CHECK  I connected with@ on 07/30/2020 by telephone  and verified that I am speaking with the correct person using two identifiers.   I discussed the limitations of evaluation and management by telemedicine. The patient expressed understanding and agreed to proceed.  Nurse is at the office, and patient is at home.  SUBJECTIVE:  Theresa Rosario is a 35 y.o. G42P1011 female here for BP check. She is postpartum, delivery date 07/23/20    HYPERTENSION ROS:  Postpartum:  . Severe headaches that don't go away with tylenol/other medicines: No  . Visual changes (seeing spots/double/blurred vision) No  . Severe pain under right breast breast or in center of upper chest No  . Severe nausea/vomiting No  . Taking medicines as instructed yes   OBJECTIVE:  BP 135/80 (BP Location: Right Arm, Patient Position: Sitting, Cuff Size: Normal)   Breastfeeding Yes   Appearance alert, well appearing, and in no distress.  ASSESSMENT: Postpartum  blood pressure check  PLAN: Discussed with [name], CNM, [facility]   Recommendations: no changes needed   Follow-up: as scheduled   Jobe Marker  07/30/2020 11:26 AM   Chart reviewed for nurse visit. Agree with plan of care.  Cheral Marker, [place] 07/31/2020 2:14 PM

## 2020-08-14 ENCOUNTER — Other Ambulatory Visit: Payer: Self-pay

## 2020-08-14 ENCOUNTER — Encounter: Payer: Self-pay | Admitting: Women's Health

## 2020-08-14 ENCOUNTER — Ambulatory Visit (INDEPENDENT_AMBULATORY_CARE_PROVIDER_SITE_OTHER): Payer: Medicaid Other | Admitting: Women's Health

## 2020-08-14 VITALS — BP 121/85 | HR 96 | Wt 231.0 lb

## 2020-08-14 DIAGNOSIS — F418 Other specified anxiety disorders: Secondary | ICD-10-CM

## 2020-08-14 DIAGNOSIS — I10 Essential (primary) hypertension: Secondary | ICD-10-CM

## 2020-08-14 NOTE — Progress Notes (Signed)
   GYN VISIT Patient name: Theresa Rosario MRN [id number]  Date of birth: 02-12-1986 Chief Complaint:   PPD check  History of Present Illness:   Theresa Rosario is a 35 y.o. G52P1011 African American female being seen today for postpartum mood check.  S/P SVB on 07/23/20. Mostly bottlefeeding, pumps once/day, doesn't have time to pump anymore. On norvasc for CHTN. Feels she's doing well w/ dep/anx, takes buspar previously rx'd occ (maybe 2-3 times since having baby). EPDS today 4.     Depression screen [facility] 2/9 07/04/2020 05/11/2020 01/24/2020 01/04/2020 07/07/2019  Decreased Interest 0 0 0 0 0  Down, Depressed, Hopeless 0 0 0 0 0  PHQ - 2 Score 0 0 0 0 0  Altered sleeping - 2 0 - -  Tired, decreased energy - 1 2 - -  Change in appetite - 0 0 - -  Feeling bad or failure about yourself  - 0 0 - -  Trouble concentrating - 0 0 - -  Moving slowly or fidgety/restless - 0 0 - -  Suicidal thoughts - 0 0 - -  PHQ-9 Score - 3 2 - -    No LMP recorded. Review of Systems:   Pertinent items are noted in HPI Denies fever/chills, dizziness, headaches, visual disturbances, fatigue, shortness of breath, chest pain, abdominal pain, vomiting, abnormal vaginal discharge/itching/odor/irritation, problems with periods, bowel movements, urination, or intercourse unless otherwise stated above.  Pertinent History Reviewed:  Reviewed past medical,surgical, social, obstetrical and family history.  Reviewed problem list, medications and allergies. Physical Assessment:   Vitals:   08/14/20 1207  BP: 121/85  Pulse: 96  Weight: 231 lb (104.8 kg)  Body mass index is 43.65 kg/m.       Physical Examination:   General appearance: alert, well appearing, and in no distress  Mental status: alert, oriented to person, place, and time  Skin: warm & dry   Cardiovascular: normal heart rate noted  Respiratory: normal respiratory effort, no distress  Abdomen: soft, non-tender   Pelvic: examination not  indicated  Extremities: no edema   Chaperone: N/A    No results found for this or any previous visit (from the past 24 hour(s)).  Assessment & Plan:  1) 3wks s/p SVB> breast & bottlefeeding  2) Dep/anx> doing well, EPDS 4, taking buspar prn  3) CHTN> on norvasc 5mg   Meds: No orders of the defined types were placed in this encounter.   No orders of the defined types were placed in this encounter.   Return for As scheduled for pp visit.  CNM, [facility] 08/14/2020 12:23 PM

## 2020-08-22 ENCOUNTER — Other Ambulatory Visit (HOSPITAL_COMMUNITY): Payer: Self-pay

## 2020-08-29 ENCOUNTER — Encounter: Payer: Self-pay | Admitting: Advanced Practice Midwife

## 2020-08-29 ENCOUNTER — Ambulatory Visit (INDEPENDENT_AMBULATORY_CARE_PROVIDER_SITE_OTHER): Payer: Medicaid Other | Admitting: Advanced Practice Midwife

## 2020-08-29 ENCOUNTER — Other Ambulatory Visit: Payer: Self-pay

## 2020-08-29 MED ORDER — DULOXETINE HCL 30 MG PO CPEP: 30.0000 mg | ORAL_CAPSULE | Freq: Every day | ORAL | 3 refills | Status: DC

## 2020-08-29 MED ORDER — NORETHINDRONE 0.35 MG PO TABS: 1.0000 | ORAL_TABLET | Freq: Every day | ORAL | 4 refills | Status: DC

## 2020-08-29 MED ORDER — AMLODIPINE BESYLATE 5 MG PO TABS: 5.0000 mg | ORAL_TABLET | Freq: Every day | ORAL | 6 refills | Status: DC

## 2020-08-29 NOTE — Progress Notes (Signed)
POSTPARTUM VISIT Patient name: Theresa Rosario MRN [id number]  Date of birth: 09-07-1985 Chief Complaint:   Postpartum Care  History of Present Illness:   Theresa Rosario is a 35 y.o. G60P2012 African American female being seen today for a postpartum visit. She is 5 weeks postpartum following a spontaneous vaginal delivery at 37.6 gestational weeks. IOL: no  Anesthesia: epidural.  Laceration: none.  Complications: none. Inpatient contraception: no.   Pregnancy complicated by cHTN (Lab 229m bid in preg); anxiety (on Buspar); prediabetes with neg 2hr GTT x 2 in preg. Tobacco use: former . Substance use disorder: no. Last pap smear: Nov 2020 and results were NILM w/ HRHPV negative. Next pap smear due: Nov 2023 No LMP recorded.  Postpartum course has been complicated by cHTN (well-managed with Norvasc 557m. Bleeding none. Bowel function is normal. Bladder function is normal. Urinary incontinence? no, fecal incontinence? no Patient is not sexually active. Last sexual activity: prior to birth of baby. Desired contraception: POPs. Patient does not know about a pregnancy in the future.  Desired family size is unsure # of children.   Upstream - 08/29/20 1349      Pregnancy Intention Screening   Does the patient want to become pregnant in the next year? No    Does the patient's partner want to become pregnant in the next year? No    Would the patient like to discuss contraceptive options today? Yes      Contraception Wrap Up   Current Method Abstinence    End Method Oral Contraceptive    Contraception Counseling Provided Yes          The pregnancy intention screening data noted above was reviewed. Potential methods of contraception were discussed. The patient elected to proceed with Oral Contraceptive.   Edinburgh Postpartum Depression Screening: negative  Edinburgh Postnatal Depression Scale - 08/29/20 1347      Edinburgh Postnatal Depression Scale:  In the Past 7 Days   I have  been able to laugh and see the funny side of things. 0    I have looked forward with enjoyment to things. 0    I have blamed myself unnecessarily when things went wrong. 1    I have been anxious or worried for no good reason. 2    I have felt scared or panicky for no good reason. 1    Things have been getting on top of me. 0    I have been so unhappy that I have had difficulty sleeping. 0    I have felt sad or miserable. 0    I have been so unhappy that I have been crying. 1    The thought of harming myself has occurred to me. 0    Edinburgh Postnatal Depression Scale Total 5           GAD 7 : Generalized Anxiety Score 05/11/2020 01/24/2020  Nervous, Anxious, on Edge 1 1  Control/stop worrying 0 0  Worry too much - different things 0 0  Trouble relaxing 0 0  Restless 0 0  Easily annoyed or irritable 0 1  Afraid - awful might happen 0 0  Total GAD 7 Score 1 2     Baby's course has been uncomplicated. Baby is feeding by bottle. Infant has a pediatrician/family doctor? Yes.  Childcare strategy if returning to work/school: family.  Pt has material needs met for her and baby: Yes.   Review of Systems:   Pertinent items are noted in HPI  Denies Abnormal vaginal discharge w/ itching/odor/irritation, headaches, visual changes, shortness of breath, chest pain, abdominal pain, severe nausea/vomiting, or problems with urination or bowel movements. Pertinent History Reviewed:  Reviewed past medical,surgical, obstetrical and family history.  Reviewed problem list, medications and allergies. OB History  Gravida Para Term Preterm AB Living  3 2 2   1 2   SAB IAB Ectopic Multiple Live Births  1       2    # Outcome Date GA Lbr Len/2nd Weight Sex Delivery Anes PTL Lv  3 SAB 08/2018          2 Term 10/29/12 [id number]:45 / 00:42 6 lb 2.4 oz (2.79 kg) F Vag-Spont EPI N LIV  1 Term            Physical Assessment:   Vitals:   08/29/20 1345  BP: (!) 133/91  Pulse: 96  Weight: 231 lb (104.8 kg)   Height: 5' 1"  (1.549 m)  Body mass index is 43.65 kg/m.       Physical Examination:   General appearance: alert, well appearing, and in no distress  Mental status: alert, oriented to person, place, and time  Skin: warm & dry   Cardiovascular: normal heart rate noted   Respiratory: normal respiratory effort, no distress   Breasts: deferred, no complaints   Abdomen: soft, non-tender   Pelvic: examination not indicated. Thin prep pap obtained: No  Rectal: not examined  Extremities: Edema: Trace and none         No results found for this or any previous visit (from the past 24 hour(s)).  Assessment & Plan:  1) Postpartum exam 2) Five wks s/p spontaneous vaginal delivery 3) bottle feeding 4) Depression screening- negative, however requests to restart on Cymbalta which she was taking prepregnancy; her dose was 636m will start on 3016m) Contraception management: would like pills, and POPs recommended due to well-managed cHTN on meds  6) Prediabetic prior to preg- neg 2hr GTT x2 in preg; rec f/u with PCP for yearly screenings 7) cHTN- on Norvasc 5mg71mssential components of care per ACOG recommendations:  1.  Mood and well being:  . If positive depression screen, discussed and plan developed.  . If using tobacco we discussed reduction/cessation and risk of relapse . If current substance abuse, we discussed and referral to local resources was offered.   2. Infant care and feeding:  . If breastfeeding, discussed returning to work, pumping, breastfeeding-associated pain, guidance regarding return to fertility while lactating if not using another method. If needed, patient was provided with a letter to be allowed to pump q 2-3hrs to support lactation in a private location with access to a refrigerator to store breastmilk.   . Recommended that all caregivers be immunized for flu, pertussis and other preventable communicable diseases . If pt does not have material needs met for her/baby,  referred to local resources for help obtaining these.  3. Sexuality, contraception and birth spacing . Provided guidance regarding sexuality, management of dyspareunia, and resumption of intercourse . Discussed avoiding interpregnancy interval <6mth49mnd recommended birth spacing of 18 months  4. Sleep and fatigue . Discussed coping options for fatigue and sleep disruption . Encouraged family/partner/community support of 4 hrs of uninterrupted sleep to help with mood and fatigue  5. Physical recovery  . If pt had a C/S, assessed incisional pain and providing guidance on normal vs prolonged recovery . If pt had a laceration, perineal healing and pain reviewed.  .Marland Kitchen  If urinary or fecal incontinence, discussed management and referred to PT or uro/gyn if indicated  . Patient is safe to resume physical activity. Discussed attainment of healthy weight.  6.  Chronic disease management . Discussed pregnancy complications if any, and their implications for future childbearing and long-term maternal health. . Review recommendations for prevention of recurrent pregnancy complications, such as 17 hydroxyprogesterone caproate to reduce risk for recurrent PTB not applicable, or aspirin to reduce risk of preeclampsia yes. . Pt had GDM: was prediabetic with neg 2hrGTT x 2 in preg.  . Reviewed medications and non-pregnant dosing including consideration of whether pt is breastfeeding using a reliable resource such as LactMed: not applicable . Referred for f/u w/ PCP or subspecialist providers as indicated: has PCP; emphasized importance of having regular visits to continue meds for cHTN and diabetes screening  7. Health maintenance . Mammogram at 35yo or earlier if indicated . Pap smears as indicated  Meds:  Meds ordered this encounter  Medications  . norethindrone (MICRONOR) 0.35 MG tablet    Sig: Take 1 tablet (0.35 mg total) by mouth daily.    Dispense:  28 tablet    Refill:  4    Order Specific  Question:   Supervising Provider    Answer:   Elonda Husky, LUTHER H [[id number]]  . DULoxetine (CYMBALTA) 30 MG capsule    Sig: Take 1 capsule (30 mg total) by mouth daily.    Dispense:  30 capsule    Refill:  3    Order Specific Question:   Supervising Provider    Answer:   Elonda Husky, LUTHER H [[id number]]  . amLODipine (NORVASC) 5 MG tablet    Sig: Take 1 tablet (5 mg total) by mouth daily.    Dispense:  30 tablet    Refill:  6    Order Specific Question:   Supervising Provider    Answer:   Florian Buff [[id number]]    Follow-up: Return in about 3 months (around 11/29/2020) for in person or virtual, contraception f/u.   No orders of the defined types were placed in this encounter.   Myrtis Ser CNM 08/29/2020 2:10 PM

## 2020-09-10 DIAGNOSIS — Z20828 Contact with and (suspected) exposure to other viral communicable diseases: Secondary | ICD-10-CM | POA: Diagnosis not present

## 2020-09-13 DIAGNOSIS — Z20828 Contact with and (suspected) exposure to other viral communicable diseases: Secondary | ICD-10-CM | POA: Diagnosis not present

## 2020-09-18 ENCOUNTER — Telehealth: Payer: Self-pay

## 2020-09-18 ENCOUNTER — Other Ambulatory Visit: Payer: Self-pay | Admitting: Adult Health

## 2020-09-18 MED ORDER — SULFAMETHOXAZOLE-TRIMETHOPRIM 800-160 MG PO TABS: 1.0000 | ORAL_TABLET | Freq: Two times a day (BID) | ORAL | 0 refills | Status: DC

## 2020-09-18 NOTE — Progress Notes (Signed)
Rx septra ds 

## 2020-09-18 NOTE — Telephone Encounter (Signed)
Patient calling requesting medication for a UTI PH# [phone]

## 2020-11-10 ENCOUNTER — Encounter: Payer: Self-pay | Admitting: Internal Medicine

## 2020-11-23 ENCOUNTER — Encounter: Payer: Self-pay | Admitting: Advanced Practice Midwife

## 2020-11-29 ENCOUNTER — Encounter: Payer: Self-pay | Admitting: Adult Health

## 2020-11-29 ENCOUNTER — Telehealth (INDEPENDENT_AMBULATORY_CARE_PROVIDER_SITE_OTHER): Payer: Medicaid Other | Admitting: Adult Health

## 2020-11-29 DIAGNOSIS — Z3041 Encounter for surveillance of contraceptive pills: Secondary | ICD-10-CM | POA: Insufficient documentation

## 2020-11-29 DIAGNOSIS — I10 Essential (primary) hypertension: Secondary | ICD-10-CM

## 2020-11-29 MED ORDER — NORETHINDRONE 0.35 MG PO TABS: 1.0000 | ORAL_TABLET | Freq: Every day | ORAL | 12 refills | Status: DC

## 2020-11-29 MED ORDER — AMLODIPINE BESYLATE 5 MG PO TABS: 5.0000 mg | ORAL_TABLET | Freq: Every day | ORAL | 6 refills | Status: DC

## 2020-11-29 NOTE — Progress Notes (Signed)
Patient ID: Theresa Rosario, female   DOB: 01/10/1986, 35 y.o.   MRN: [id number]   TELEHEALTH GYNECOLOGY VISIT ENCOUNTER NOTE  [facility] location: [facility]   Patient location: Home  I connected with Theresa Rosario on 11/29/20 at  3:50 PM EDT by telephone and verified that I am speaking with the correct person using two identifiers. Patient was unable to do MyChart audiovisual encounter due to technical difficulties, she tried several times.    I discussed the limitations, risks, security and privacy concerns of performing an evaluation and management service by telephone and the availability of in person appointments. I also discussed with the patient that there may be a patient responsible charge related to this service. The patient expressed understanding and agreed to proceed.   History:  Theresa Rosario is a 35 y.o. G28P2011 female being evaluated today for folow up on taking Micronor and likes them, she has moved and can't find BP cuff, but was normal last time checked she says. She denies any abnormal vaginal discharge, bleeding, pelvic pain,headaches or dizziness or other concerns.       Past Medical History:  Diagnosis Date   Anemia 12/30/2018   Blood in urine 10/18/2015   Condyloma of female genitalia 10/21/2012   Subclitoral, TCA 7/31    Depression    Diabetes mellitus without complication (HCC)    Fibromyalgia    Hx of hematuria    Hx of ovarian cyst    Hypertension    Irregular bleeding 12/02/2017   Kidney stone    History of kidney stones   Large breasts    Lumbago 11/11/2011   Migraine    Past Surgical History:  Procedure Laterality Date   DENTAL SURGERY     WISDOM TOOTH EXTRACTION Bilateral    The following portions of the patient's history were reviewed and updated as appropriate: allergies, current medications, past family history, past medical history, past social history, past surgical history and problem list.    Health Maintenance:  Normal pap and negative HRHPV on 02/09/2019  Review of Systems:  Pertinent items noted in HPI and remainder of comprehensive ROS otherwise negative.  Physical Exam:   General:  Alert, oriented and cooperative.   Mental Status: Normal mood and affect perceived. Normal judgment and thought content.  Physical exam deferred due to nature of the encounter  Upstream - 11/29/20 1604       Pregnancy Intention Screening   Does the patient want to become pregnant in the next year? No    Does the patient's partner want to become pregnant in the next year? No    Would the patient like to discuss contraceptive options today? No      Contraception Wrap Up   Current Method Oral Contraceptive    End Method Oral Contraceptive    Contraception Counseling Provided No             Labs and Imaging No results found for this or any previous visit (from the past 336 hour(s)). No results found.    Assessment and Plan:     1. Chronic hypertension Continue Norvasc Meds ordered this encounter  Medications   norethindrone (MICRONOR) 0.35 MG tablet    Sig: Take 1 tablet (0.35 mg total) by mouth daily.    Dispense:  28 tablet    Refill:  12    Order Specific Question:   Supervising Provider    Answer:   Duane Lope H [[id number]]  amLODipine (NORVASC) 5 MG tablet    Sig: Take 1 tablet (5 mg total) by mouth daily.    Dispense:  30 tablet    Refill:  6    Order Specific Question:   Supervising Provider    Answer:   Despina Hidden, LUTHER H [[id number]]     2. Encounter for surveillance of contraceptive pills Will refill Micronor  Follow up in 6 months or sooner if needed       I discussed the assessment and treatment plan with the patient. The patient was provided an opportunity to ask questions and all were answered. The patient agreed with the plan and demonstrated an understanding of the instructions.   The patient was advised to call back or seek an in-person evaluation/go to the ED  if the symptoms worsen or if the condition fails to improve as anticipated.  I provided 10 minutes of non-face-to-face time during this encounter. I was in my office at [facility] tree during this encounter.   Cyril Mourning, NP [facility] [employer], [facility]

## 2020-12-06 DIAGNOSIS — G43701 Chronic migraine without aura, not intractable, with status migrainosus: Secondary | ICD-10-CM | POA: Diagnosis not present

## 2020-12-06 DIAGNOSIS — M25559 Pain in unspecified hip: Secondary | ICD-10-CM | POA: Diagnosis not present

## 2020-12-06 DIAGNOSIS — M542 Cervicalgia: Secondary | ICD-10-CM | POA: Diagnosis not present

## 2020-12-06 DIAGNOSIS — G5603 Carpal tunnel syndrome, bilateral upper limbs: Secondary | ICD-10-CM | POA: Diagnosis not present

## 2020-12-06 DIAGNOSIS — M797 Fibromyalgia: Secondary | ICD-10-CM | POA: Diagnosis not present

## 2020-12-06 DIAGNOSIS — F419 Anxiety disorder, unspecified: Secondary | ICD-10-CM | POA: Diagnosis not present

## 2020-12-06 DIAGNOSIS — G4733 Obstructive sleep apnea (adult) (pediatric): Secondary | ICD-10-CM | POA: Diagnosis not present

## 2020-12-06 DIAGNOSIS — L299 Pruritus, unspecified: Secondary | ICD-10-CM | POA: Diagnosis not present

## 2020-12-06 DIAGNOSIS — M545 Low back pain, unspecified: Secondary | ICD-10-CM | POA: Diagnosis not present

## 2020-12-24 ENCOUNTER — Other Ambulatory Visit (HOSPITAL_COMMUNITY): Payer: Self-pay | Admitting: Neurology

## 2020-12-24 DIAGNOSIS — M542 Cervicalgia: Secondary | ICD-10-CM

## 2020-12-24 DIAGNOSIS — M546 Pain in thoracic spine: Secondary | ICD-10-CM

## 2020-12-24 DIAGNOSIS — M545 Low back pain, unspecified: Secondary | ICD-10-CM

## 2020-12-25 ENCOUNTER — Other Ambulatory Visit: Payer: Self-pay

## 2020-12-25 ENCOUNTER — Ambulatory Visit (HOSPITAL_COMMUNITY)
Admission: RE | Admit: 2020-12-25 | Discharge: 2020-12-25 | Disposition: A | Discharge status: Home / Self Care | Payer: Medicaid Other | Source: Ambulatory Visit | Attending: Neurology | Admitting: Neurology

## 2020-12-25 DIAGNOSIS — M546 Pain in thoracic spine: Secondary | ICD-10-CM | POA: Insufficient documentation

## 2020-12-25 DIAGNOSIS — M545 Low back pain, unspecified: Secondary | ICD-10-CM | POA: Diagnosis not present

## 2020-12-25 DIAGNOSIS — M542 Cervicalgia: Secondary | ICD-10-CM | POA: Insufficient documentation

## 2021-01-03 ENCOUNTER — Encounter: Payer: Medicaid Other | Admitting: Internal Medicine

## 2021-01-03 DIAGNOSIS — G5603 Carpal tunnel syndrome, bilateral upper limbs: Secondary | ICD-10-CM | POA: Diagnosis not present

## 2021-01-03 DIAGNOSIS — M25559 Pain in unspecified hip: Secondary | ICD-10-CM | POA: Diagnosis not present

## 2021-01-03 DIAGNOSIS — M545 Low back pain, unspecified: Secondary | ICD-10-CM | POA: Diagnosis not present

## 2021-01-03 DIAGNOSIS — M797 Fibromyalgia: Secondary | ICD-10-CM | POA: Diagnosis not present

## 2021-01-03 DIAGNOSIS — G43701 Chronic migraine without aura, not intractable, with status migrainosus: Secondary | ICD-10-CM | POA: Diagnosis not present

## 2021-01-03 DIAGNOSIS — M542 Cervicalgia: Secondary | ICD-10-CM | POA: Diagnosis not present

## 2021-01-03 DIAGNOSIS — G4733 Obstructive sleep apnea (adult) (pediatric): Secondary | ICD-10-CM | POA: Diagnosis not present

## 2021-01-17 ENCOUNTER — Encounter: Payer: Self-pay | Admitting: Internal Medicine

## 2021-01-17 ENCOUNTER — Other Ambulatory Visit: Payer: Self-pay

## 2021-01-17 ENCOUNTER — Ambulatory Visit (INDEPENDENT_AMBULATORY_CARE_PROVIDER_SITE_OTHER): Payer: Medicaid Other | Admitting: Internal Medicine

## 2021-01-17 VITALS — BP 129/73 | HR 97 | Resp 18 | Ht 61.0 in | Wt 252.1 lb

## 2021-01-17 DIAGNOSIS — E782 Mixed hyperlipidemia: Secondary | ICD-10-CM | POA: Diagnosis not present

## 2021-01-17 DIAGNOSIS — K219 Gastro-esophageal reflux disease without esophagitis: Secondary | ICD-10-CM

## 2021-01-17 DIAGNOSIS — Z23 Encounter for immunization: Secondary | ICD-10-CM | POA: Diagnosis not present

## 2021-01-17 DIAGNOSIS — D508 Other iron deficiency anemias: Secondary | ICD-10-CM | POA: Diagnosis not present

## 2021-01-17 DIAGNOSIS — R7303 Prediabetes: Secondary | ICD-10-CM

## 2021-01-17 DIAGNOSIS — R109 Unspecified abdominal pain: Secondary | ICD-10-CM

## 2021-01-17 DIAGNOSIS — F418 Other specified anxiety disorders: Secondary | ICD-10-CM | POA: Diagnosis not present

## 2021-01-17 DIAGNOSIS — I10 Essential (primary) hypertension: Secondary | ICD-10-CM

## 2021-01-17 DIAGNOSIS — Z87442 Personal history of urinary calculi: Secondary | ICD-10-CM | POA: Diagnosis not present

## 2021-01-17 LAB — POCT URINALYSIS DIP (CLINITEK)
Bilirubin, UA: NEGATIVE
Glucose, UA: NEGATIVE mg/dL
Ketones, POC UA: NEGATIVE mg/dL
Leukocytes, UA: NEGATIVE
Nitrite, UA: NEGATIVE
POC PROTEIN,UA: NEGATIVE
Spec Grav, UA: 1.02 (ref 1.010–1.025)
Urobilinogen, UA: 0.2 E.U./dL
pH, UA: 7 (ref 5.0–8.0)

## 2021-01-17 NOTE — Assessment & Plan Note (Signed)
BP Readings from Last 1 Encounters:  01/17/21 129/73   Well-controlled with amlodipine 5 mg daily Counseled for compliance with the medications Advised to follow DASH diet and perform moderate exercise/walking at least 150 minutes/week

## 2021-01-17 NOTE — Assessment & Plan Note (Signed)
Likely related to obesity Diet controlled, advised to continue diabetic diet Check HbA1c

## 2021-01-17 NOTE — Progress Notes (Addendum)
Established Patient Office Visit  Subjective:  Patient ID: Theresa Rosario, female    DOB: 12-15-1985  Age: 35 y.o. MRN: [id number]  CC:  Chief Complaint  Patient presents with   Follow-up    Follow up A1c Check also pt has been having lower back pain on both sides is a dull pain comes and goes has history of kidney stones     HPI Theresa Rosario  is a 35 year old old female with past medical history of hypertension, prediabetes, fibromyalgia, insomnia, OSA and morbid obesity presents for follow up of her HTN.  BP is well-controlled.  She has started taking amlodipine after delivery. Takes medications regularly. Patient denies headache, dizziness, chest pain, dyspnea or palpitations.  She complains of right-sided flank pain, which is intermittent.  Of note she has history of nephrolithiasis noted on CT renal in 2018.  UA in the office today showed RBCs in urine.  She denies any dysuria or hematuria currently.  She takes Cymbalta and clonazepam for depression with anxiety, managed by neurology/psychiatry.  She sees Neurology for chronic low back pain.  She denies any new numbness or weakness of the LE.  She received flu vaccine in the office today.   Past Medical History:  Diagnosis Date   Anemia 12/30/2018   Blood in urine 10/18/2015   Condyloma of female genitalia 10/21/2012   Subclitoral, TCA 7/31    Depression    Diabetes mellitus without complication (HCC)    Fibromyalgia    Hx of hematuria    Hx of ovarian cyst    Hypertension    Irregular bleeding 12/02/2017   Kidney stone    History of kidney stones   Large breasts    Lumbago 11/11/2011   Migraine     Past Surgical History:  Procedure Laterality Date   DENTAL SURGERY     WISDOM TOOTH EXTRACTION Bilateral     Family History  Problem Relation Age of Onset   Diabetes Paternal Grandmother    Hypertension Paternal Grandmother    Diabetes Maternal Grandmother    Heart disease Maternal Grandfather     Hypertension Father    Diabetes Mother    Hypertension Mother    Parkinson's disease Mother    Colon cancer Mother 81   Coronary artery disease Other    Diabetes Other     Social History   Socioeconomic History   Marital status: Significant Other    Spouse name: Not on file   Number of children: 2   Years of education: college   Highest education level: Master's degree (e.g., MA, MS, MEng, MEd, MSW, MBA)  Occupational History   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 Right-handed. 2-3 cups caffeine per day.   Social Determinants of Health   Financial Resource Strain: Low Risk    Difficulty of  Paying Living Expenses: Not very hard  Food Insecurity: No Food Insecurity   Worried About [employer] in the Last Year: Never true   Ran Out of Food in the Last Year: Never true  Transportation Needs: No Transportation Needs   Lack of Transportation (Medical): No   Lack of Transportation (Non-Medical): No  Physical Activity: Inactive   Days of Exercise per Week: 0 days   Minutes of Exercise per Session: 0 min  Stress: No Stress Concern Present   Feeling of Stress : Only a little  Social Connections: Moderately Isolated   Frequency of Communication with Friends and Family: Once a week   Frequency of Social Gatherings with Friends and Family: Never   Attends Religious Services: 1 to 4 times per year   Active Member of [employer] or Organizations:  No   Attends Music therapist: Never   Marital Status: Living with partner  Intimate Partner Violence: Not At Risk   Fear of Current or Ex-Partner: No   Emotionally Abused: No   Physically Abused: No   Sexually Abused: No    Outpatient Medications Prior to Visit  Medication Sig Dispense Refill   acetaminophen (TYLENOL) 500 MG tablet Take 2 tablets (1,000 mg total) by mouth every 6 (six) hours. 30 tablet 0   amLODipine (NORVASC) 5 MG tablet Take 1 tablet (5 mg total) by mouth daily. 30 tablet 6   clonazePAM (KLONOPIN) 0.5 MG tablet Take 0.5 mg by mouth 2 (two) times daily as needed.     cyclobenzaprine (FLEXERIL) 10 MG tablet Take 1 tablet by mouth 2 (two) times daily as needed.     DULoxetine (CYMBALTA) 30 MG capsule Take 1 capsule (30 mg total) by mouth daily. 30 capsule 3   gabapentin (NEURONTIN) 600 MG tablet Take 600 mg by mouth 3 (three) times daily.     norethindrone (MICRONOR) 0.35 MG tablet Take 1 tablet (0.35 mg total) by mouth daily. 28 tablet 12   pantoprazole (PROTONIX) 20 MG tablet Take 20 mg by mouth daily.     traMADol (ULTRAM) 50 MG tablet Take 100 mg by mouth every 8 (eight) hours as needed.     No facility-administered medications prior to visit.    Allergies  Allergen Reactions   Amoxicillin Hives   Banana Itching    Tongue itching   Motrin [Ibuprofen] Rash    MOTRIN (Brand only) causes this allergy (tolerates ADVIL brand)   Other Itching    Tree nuts cause tongue itching    Pineapple Itching    Tongue itching    ROS Review of Systems  Constitutional:  Negative for chills and fever.  HENT:  Negative for congestion, sinus pressure, sinus pain and sore throat.   Eyes:  Negative for pain and discharge.  Respiratory:  Negative for cough and shortness of breath.   Cardiovascular:  Negative for chest pain and palpitations.  Gastrointestinal:  Positive for abdominal pain (R flank pain). Negative for diarrhea, nausea and vomiting.  Endocrine:  Negative for polydipsia and polyuria.  Genitourinary:  Negative for dysuria and hematuria.  Musculoskeletal:  Positive for arthralgias and back pain. Negative for neck pain and neck stiffness.  Skin:  Negative for rash.  Neurological:  Negative for dizziness, syncope, weakness and numbness.  Psychiatric/Behavioral:  Negative for agitation and behavioral problems.      Objective:    Physical Exam Vitals reviewed.  Constitutional:      General: She is not in acute distress.  Appearance: She is obese. She is not diaphoretic.  HENT:     Head: Normocephalic and atraumatic.     Nose: Nose normal.     Mouth/Throat:     Mouth: Mucous membranes are moist.  Eyes:     General: No scleral icterus.    Extraocular Movements: Extraocular movements intact.  Cardiovascular:     Rate and Rhythm: Normal rate and regular rhythm.     Pulses: Normal pulses.     Heart sounds: Normal heart sounds. No murmur heard. Pulmonary:     Breath sounds: Normal breath sounds. No wheezing or rales.  Abdominal:     Palpations: Abdomen is soft.     Tenderness: There is no abdominal tenderness. There is no right CVA tenderness or left CVA tenderness.  Musculoskeletal:     Cervical back: Neck supple. No tenderness.     Right lower leg: No edema.     Left lower leg: No edema.  Skin:    General: Skin is warm.     Findings: No rash.  Neurological:     General: No focal deficit present.     Mental Status: She is alert and oriented to person, place, and time.  Psychiatric:        Mood and Affect: Mood normal.        Behavior: Behavior normal.    BP 129/73 (BP Location: Left Arm, Patient Position: Sitting, Cuff Size: Normal)   Pulse 97   Resp 18   Ht 5' 1"  (1.549 m)   Wt 252 lb 1.3 oz (114.3 kg)   SpO2 97%   BMI 47.63 kg/m  Wt Readings from Last 3 Encounters:  01/17/21 252 lb 1.3 oz (114.3 kg)  08/29/20 231 lb (104.8 kg)  08/14/20 231 lb (104.8 kg)     Health Maintenance Due  Topic Date Due    Pneumococcal Vaccine 29-27 Years old (1 - PCV) Never done    There are no preventive care reminders to display for this patient.  Lab Results  Component Value Date   TSH 1.950 06/30/2019   Lab Results  Component Value Date   WBC 6.5 01/18/2021   HGB 13.6 01/18/2021   HCT 43.8 01/18/2021   MCV 74 (L) 01/18/2021   PLT 326 01/18/2021   Lab Results  Component Value Date   NA 140 01/18/2021   K 4.6 01/18/2021   CO2 23 01/18/2021   GLUCOSE 93 01/18/2021   BUN 7 01/18/2021   CREATININE 0.88 01/18/2021   BILITOT 0.3 01/18/2021   ALKPHOS 113 01/18/2021   AST 13 01/18/2021   ALT 16 01/18/2021   PROT 7.3 01/18/2021   ALBUMIN 4.3 01/18/2021   CALCIUM 9.2 01/18/2021   ANIONGAP 9 07/22/2020   EGFR 88 01/18/2021   Lab Results  Component Value Date   CHOL 136 01/18/2021   Lab Results  Component Value Date   HDL 35 (L) 01/18/2021   Lab Results  Component Value Date   LDLCALC 88 01/18/2021   Lab Results  Component Value Date   TRIG 65 01/18/2021   Lab Results  Component Value Date   CHOLHDL 3.9 01/18/2021   Lab Results  Component Value Date   HGBA1C 6.3 (H) 01/18/2021      Assessment & Plan:   Problem List Items Addressed This Visit       Cardiovascular and Mediastinum   Essential hypertension - Primary    BP Readings from Last 1 Encounters:  01/17/21 129/73  Well-controlled with amlodipine  5 mg daily Counseled for compliance with the medications Advised to follow DASH diet and perform moderate exercise/walking at least 150 minutes/week         Digestive   GERD (gastroesophageal reflux disease)    Takes pantoprazole as needed        Other   Depression with anxiety    On Cymbalta and clonazepam, managed by neurology/psychiatry      IDA (iron deficiency anemia)    Last checked at the time of delivery We will recheck CBC      Relevant Orders   CBC with Differential (Completed)   Prediabetes    Likely related to obesity Diet controlled,  advised to continue diabetic diet Check HbA1c      Relevant Orders   Microalbumin, urine (Completed)   CMP14+EGFR (Completed)   Hemoglobin A1c (Completed)   History of nephrolithiasis    Right-sided flank pain and history of nephrolithiasis UA showed RBCs, but no LE or nitrite Will check US renal Advised to improve fluid intake      Relevant Orders   US Renal   Other Visit Diagnoses     Mixed hyperlipidemia       Relevant Orders   Lipid panel (Completed)   Right flank pain       Relevant Orders   POCT URINALYSIS DIP (CLINITEK) (Completed)   US Renal   Need for immunization against influenza       Relevant Orders   Flu Vaccine QUAD 74moIM (Fluarix, Fluzone & Alfiuria Quad PF) (Completed)       No orders of the defined types were placed in this encounter.   Follow-up: Return in about 6 months (around 07/18/2021).    RLindell Spar MD

## 2021-01-17 NOTE — Patient Instructions (Signed)
Please continue taking Amlodipine as prescribed.  Please follow low carb diet and perform moderate exercise/walking at least 150 mins/week.  Please get fasting blood tests done within a week.  You are being scheduled for CT of the abdomen for kidney stone.

## 2021-01-17 NOTE — Assessment & Plan Note (Signed)
On Cymbalta and clonazepam, managed by neurology/psychiatry

## 2021-01-17 NOTE — Assessment & Plan Note (Signed)
Takes pantoprazole as needed

## 2021-01-17 NOTE — Assessment & Plan Note (Addendum)
Right-sided flank pain and history of nephrolithiasis UA showed RBCs, but no LE or nitrite Will check US renal Advised to improve fluid intake

## 2021-01-17 NOTE — Assessment & Plan Note (Signed)
Last checked at the time of delivery We will recheck CBC

## 2021-01-18 DIAGNOSIS — R7303 Prediabetes: Secondary | ICD-10-CM | POA: Diagnosis not present

## 2021-01-18 DIAGNOSIS — D508 Other iron deficiency anemias: Secondary | ICD-10-CM | POA: Diagnosis not present

## 2021-01-18 DIAGNOSIS — E782 Mixed hyperlipidemia: Secondary | ICD-10-CM | POA: Diagnosis not present

## 2021-01-19 LAB — CMP14+EGFR
ALT: 16 IU/L (ref 0–32)
AST: 13 IU/L (ref 0–40)
Albumin/Globulin Ratio: 1.4 (ref 1.2–2.2)
Albumin: 4.3 g/dL (ref 3.8–4.8)
Alkaline Phosphatase: 113 IU/L (ref 44–121)
BUN/Creatinine Ratio: 8 — ABNORMAL LOW (ref 9–23)
BUN: 7 mg/dL (ref 6–20)
Bilirubin Total: 0.3 mg/dL (ref 0.0–1.2)
CO2: 23 mmol/L (ref 20–29)
Calcium: 9.2 mg/dL (ref 8.7–10.2)
Chloride: 103 mmol/L (ref 96–106)
Creatinine, Ser: 0.88 mg/dL (ref 0.57–1.00)
Globulin, Total: 3 g/dL (ref 1.5–4.5)
Glucose: 93 mg/dL (ref 70–99)
Potassium: 4.6 mmol/L (ref 3.5–5.2)
Sodium: 140 mmol/L (ref 134–144)
Total Protein: 7.3 g/dL (ref 6.0–8.5)
eGFR: 88 mL/min/{1.73_m2} (ref >59)

## 2021-01-19 LAB — CBC WITH DIFFERENTIAL/PLATELET
Basophils Absolute: 0 10*3/uL (ref 0.0–0.2)
Basos: 0 %
EOS (ABSOLUTE): 0.1 10*3/uL (ref 0.0–0.4)
Eos: 2 %
Hematocrit: 43.8 % (ref 34.0–46.6)
Hemoglobin: 13.6 g/dL (ref 11.1–15.9)
Immature Grans (Abs): 0 10*3/uL (ref 0.0–0.1)
Immature Granulocytes: 0 %
Lymphocytes Absolute: 1.9 10*3/uL (ref 0.7–3.1)
Lymphs: 29 %
MCH: 22.8 pg — ABNORMAL LOW (ref 26.6–33.0)
MCHC: 31.1 g/dL — ABNORMAL LOW (ref 31.5–35.7)
MCV: 74 fL — ABNORMAL LOW (ref 79–97)
Monocytes Absolute: 0.6 10*3/uL (ref 0.1–0.9)
Monocytes: 9 %
Neutrophils Absolute: 3.9 10*3/uL (ref 1.4–7.0)
Neutrophils: 60 %
Platelets: 326 10*3/uL (ref 150–450)
RBC: 5.96 x10E6/uL — ABNORMAL HIGH (ref 3.77–5.28)
RDW: 15.5 % — ABNORMAL HIGH (ref 11.7–15.4)
WBC: 6.5 10*3/uL (ref 3.4–10.8)

## 2021-01-19 LAB — LIPID PANEL
Chol/HDL Ratio: 3.9 ratio (ref 0.0–4.4)
Cholesterol, Total: 136 mg/dL (ref 100–199)
HDL: 35 mg/dL — ABNORMAL LOW (ref >39)
LDL Chol Calc (NIH): 88 mg/dL (ref 0–99)
Triglycerides: 65 mg/dL (ref 0–149)
VLDL Cholesterol Cal: 13 mg/dL (ref 5–40)

## 2021-01-19 LAB — MICROALBUMIN, URINE: Microalbumin, Urine: <3 ug/mL

## 2021-01-19 LAB — HEMOGLOBIN A1C
Est. average glucose Bld gHb Est-mCnc: 134 mg/dL
Hgb A1c MFr Bld: 6.3 % — ABNORMAL HIGH (ref 4.8–5.6)

## 2021-01-21 ENCOUNTER — Telehealth: Payer: Self-pay

## 2021-01-21 ENCOUNTER — Other Ambulatory Visit: Payer: Self-pay | Admitting: Internal Medicine

## 2021-01-21 DIAGNOSIS — D509 Iron deficiency anemia, unspecified: Secondary | ICD-10-CM

## 2021-01-21 MED ORDER — IRON (FERROUS SULFATE) 325 (65 FE) MG PO TABS: 325.0000 mg | ORAL_TABLET | Freq: Every day | ORAL | 3 refills | Status: DC

## 2021-01-21 NOTE — Telephone Encounter (Signed)
Patient returning lab result call. Call back # [phone]

## 2021-01-21 NOTE — Telephone Encounter (Signed)
Patient aware of all results and verbalized understanding

## 2021-01-22 NOTE — Addendum Note (Signed)
Addended byTrena Platt on: 01/22/2021 08:56 AM   Modules accepted: Orders

## 2021-01-29 ENCOUNTER — Other Ambulatory Visit: Payer: Self-pay

## 2021-01-29 ENCOUNTER — Ambulatory Visit (HOSPITAL_COMMUNITY)
Admission: RE | Admit: 2021-01-29 | Discharge: 2021-01-29 | Disposition: A | Discharge status: Home / Self Care | Payer: Medicaid Other | Source: Ambulatory Visit | Attending: Internal Medicine | Admitting: Internal Medicine

## 2021-01-29 DIAGNOSIS — Z87442 Personal history of urinary calculi: Secondary | ICD-10-CM | POA: Insufficient documentation

## 2021-01-29 DIAGNOSIS — R109 Unspecified abdominal pain: Secondary | ICD-10-CM | POA: Insufficient documentation

## 2021-01-31 DIAGNOSIS — G5601 Carpal tunnel syndrome, right upper limb: Secondary | ICD-10-CM | POA: Diagnosis not present

## 2021-01-31 DIAGNOSIS — G5603 Carpal tunnel syndrome, bilateral upper limbs: Secondary | ICD-10-CM | POA: Diagnosis not present

## 2021-01-31 DIAGNOSIS — G5602 Carpal tunnel syndrome, left upper limb: Secondary | ICD-10-CM | POA: Diagnosis not present

## 2021-03-01 DIAGNOSIS — G5603 Carpal tunnel syndrome, bilateral upper limbs: Secondary | ICD-10-CM | POA: Diagnosis not present

## 2021-03-26 ENCOUNTER — Other Ambulatory Visit: Payer: Self-pay | Admitting: Internal Medicine

## 2021-03-26 ENCOUNTER — Encounter: Payer: Self-pay | Admitting: Internal Medicine

## 2021-03-26 DIAGNOSIS — K219 Gastro-esophageal reflux disease without esophagitis: Secondary | ICD-10-CM

## 2021-03-26 MED ORDER — PANTOPRAZOLE SODIUM 40 MG PO TBEC: 40.0000 mg | DELAYED_RELEASE_TABLET | Freq: Every day | ORAL | 1 refills | Status: DC

## 2021-03-26 NOTE — Addendum Note (Signed)
Addended byTrena Platt on: 03/26/2021 12:34 PM   Modules accepted: Orders

## 2021-04-02 ENCOUNTER — Other Ambulatory Visit: Payer: Self-pay

## 2021-04-02 ENCOUNTER — Ambulatory Visit (INDEPENDENT_AMBULATORY_CARE_PROVIDER_SITE_OTHER): Payer: Medicaid Other | Admitting: Internal Medicine

## 2021-04-02 ENCOUNTER — Encounter: Payer: Self-pay | Admitting: Internal Medicine

## 2021-04-02 VITALS — BP 124/68 | HR 86 | Resp 18 | Ht 61.0 in | Wt 254.0 lb

## 2021-04-02 DIAGNOSIS — G4733 Obstructive sleep apnea (adult) (pediatric): Secondary | ICD-10-CM | POA: Diagnosis not present

## 2021-04-02 DIAGNOSIS — L249 Irritant contact dermatitis, unspecified cause: Secondary | ICD-10-CM | POA: Diagnosis not present

## 2021-04-02 DIAGNOSIS — M797 Fibromyalgia: Secondary | ICD-10-CM | POA: Diagnosis not present

## 2021-04-02 DIAGNOSIS — R7303 Prediabetes: Secondary | ICD-10-CM

## 2021-04-02 DIAGNOSIS — Z0001 Encounter for general adult medical examination with abnormal findings: Secondary | ICD-10-CM

## 2021-04-02 DIAGNOSIS — I10 Essential (primary) hypertension: Secondary | ICD-10-CM

## 2021-04-02 MED ORDER — TRIAMCINOLONE ACETONIDE 0.1 % EX CREA: 1.0000 "application " | TOPICAL_CREAM | Freq: Two times a day (BID) | CUTANEOUS | 0 refills | Status: DC

## 2021-04-02 NOTE — Assessment & Plan Note (Signed)
Physical exam as documented. Fasting labs ordered. Advised to follow low-carb diet and focus on weight loss. Has had flu vaccine.

## 2021-04-02 NOTE — Assessment & Plan Note (Signed)
Likely related to obesity Diet controlled, advised to continue diabetic diet Check HbA1c 

## 2021-04-02 NOTE — Progress Notes (Signed)
Established Patient Office Visit  Subjective:  Patient ID: Theresa Rosario, female    DOB: February 12, 1986  Age: 36 y.o. MRN: [id number]  CC:  Chief Complaint  Patient presents with   Annual Exam    Annual exam pt has rash its on arms legs and breast feels like she is itching all over has been going on for a few weeks pow[place] ayeisha gave her hydroxyzine and this is helping trying to figure out what is causing it     HPI Theresa Rosario is a 36 y.o. female with past medical history of hypertension, prediabetes, fibromyalgia, insomnia, OSA and morbid obesity who presents for annual physical.  She complains of having a itching rash over b/l arms, upper chest wall area and behind the knee for the last 2 weeks.  She denies any new chemical exposure, including detergents, soap, perfumes, cleaning material, etc. She was given Atarax by neurology, which has helped with itching.  HTN: BP is well-controlled. Takes medications regularly. Patient denies headache, dizziness, chest pain, dyspnea or palpitations.  She has CPAP for OSA, but has not been using it regularly.  She states that she gets anxious when she uses CPAP.  She has a nasal mask and agrees to use it regularly now.  She takes Cymbalta and clonazepam for depression with anxiety, managed by neurology/psychiatry.  She takes tramadol as needed for chronic low back pain and fibromyalgia.  Past Medical History:  Diagnosis Date   Abnormal chromosomal and genetic finding on antenatal screening mother 02/06/2020   On Horizon Nov 2021     Silent carrier for alpha-thalassemia (FOB____)  and increased carrier risk for SMA  (FOB____)   Anemia 12/30/2018   Blood in urine 10/18/2015   Condyloma of female genitalia 10/21/2012   Subclitoral, TCA 7/31    Depression    Diabetes mellitus without complication (HCC)    Fibromyalgia    Hx of hematuria    Hx of ovarian cyst    Hypertension    Irregular bleeding 12/02/2017   Kidney stone     History of kidney stones   Large breasts    Lumbago 11/11/2011   Migraine     Past Surgical History:  Procedure Laterality Date   DENTAL SURGERY     WISDOM TOOTH EXTRACTION Bilateral     Family History  Problem Relation Age of Onset   Diabetes Paternal Grandmother    Hypertension Paternal Grandmother    Diabetes Maternal Grandmother    Heart disease Maternal Grandfather    Hypertension Father    Diabetes Mother    Hypertension Mother    Parkinson's disease Mother    Colon cancer Mother 26   Coronary artery disease Other    Diabetes Other     Social History   Socioeconomic History   Marital status: Significant Other    Spouse name: Not on file   Number of children: 2   Years of education: college   Highest education level: Master's degree (e.g., MA, MS, MEng, MEd, MSW, MBA)  Occupational History   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 Right-handed. 2-3 cups caffeine per day.   Social Determinants of Health   Financial Resource Strain: Low Risk    Difficulty of Paying Living Expenses: Not very hard  Food Insecurity: No Food Insecurity   Worried About [employer] fund[employer] in the Last Year: Never true   Ran Out of Food in the Last Year: Never true  Transportation Needs: No Transportation Needs   Lack of Transportation (Medical): No   Lack of Transportation (Non-Medical): No  Physical Activity: Inactive   Days of Exercise per Week:  0 days   Minutes of Exercise per Session: 0 min  Stress: No Stress Concern Present   Feeling of Stress : Only a little  Social Connections: Moderately Isolated   Frequency of Communication with Friends and Family: Once a week   Frequency of Social Gatherings with Friends and Family: Never   Attends Religious Services: 1 to 4 times per year   Active Member of [employer] or Organizations: No   Attends Music therapist: Never   Marital Status: Living with partner  Intimate Partner Violence: Not At Risk   Fear of Current or Ex-Partner: No   Emotionally Abused: No   Physically Abused: No   Sexually Abused: No    Outpatient Medications Prior to Visit  Medication Sig Dispense Refill   acetaminophen (TYLENOL) 500 MG tablet Take 2 tablets (1,000 mg total) by mouth every 6 (six) hours. 30 tablet 0   amLODipine (NORVASC) 5 MG tablet Take 1 tablet (5 mg total) by mouth daily. 30 tablet 6   clonazePAM (KLONOPIN) 0.5 MG tablet Take 0.5 mg by mouth 2 (two) times daily as needed.     cyclobenzaprine (FLEXERIL) 10 MG tablet Take 1 tablet by mouth 2 (two) times daily as needed.     DULoxetine (CYMBALTA) 30 MG capsule Take 1 capsule (30 mg total) by mouth daily. 30 capsule 3   hydrOXYzine (ATARAX) 25 MG tablet Take 25 mg by mouth 3 (three) times daily as needed.     Iron, Ferrous Sulfate, 325 (65 Fe) MG TABS Take 325 mg by mouth daily. 30 tablet 3   norethindrone (MICRONOR) 0.35 MG tablet Take 1 tablet (0.35 mg total) by mouth daily. 28 tablet 12   pantoprazole (PROTONIX) 40 MG tablet Take 1 tablet (40 mg total) by mouth daily. 90 tablet 1   traMADol (ULTRAM) 50 MG tablet Take 100 mg by mouth every 8 (eight) hours as needed.     gabapentin (NEURONTIN) 600 MG tablet Take 600 mg by mouth 3 (three) times daily. (Patient not taking: Reported on 04/02/2021)     No facility-administered medications prior to visit.    Allergies  Allergen Reactions   Amoxicillin Hives   Banana Itching    Tongue  itching   Motrin [Ibuprofen] Rash    MOTRIN (Brand only) causes this allergy (tolerates ADVIL brand)   Other Itching    Tree nuts cause tongue itching    Pineapple Itching    Tongue itching    ROS Review of Systems  Constitutional:  Negative for chills and fever.  HENT:  Negative for congestion, sinus pressure, sinus pain and sore throat.   Eyes:  Negative for pain and discharge.  Respiratory:  Negative for cough and shortness of breath.   Cardiovascular:  Negative for chest pain and palpitations.  Gastrointestinal:  Negative for diarrhea, nausea and vomiting.  Endocrine: Negative for polydipsia and polyuria.  Genitourinary:  Negative for dysuria and hematuria.  Musculoskeletal:  Positive for arthralgias and back pain. Negative for neck pain and neck stiffness.  Skin:  Positive for rash.  Neurological:  Negative for dizziness, syncope, weakness and numbness.  Psychiatric/Behavioral:  Negative for agitation and behavioral problems.      Objective:    Physical Exam Vitals reviewed.  Constitutional:      General: She is not in acute distress.    Appearance: She is obese. She is not diaphoretic.  HENT:     Head: Normocephalic and atraumatic.     Nose: Nose normal.     Mouth/Throat:     Mouth: Mucous membranes are moist.  Eyes:     General: No scleral icterus.    Extraocular Movements: Extraocular movements intact.  Cardiovascular:     Rate and Rhythm: Normal rate and regular rhythm.     Pulses: Normal pulses.     Heart sounds: Normal heart sounds. No murmur heard. Pulmonary:     Breath sounds: Normal breath sounds. No wheezing or rales.  Abdominal:     Palpations: Abdomen is soft.     Tenderness: There is no abdominal tenderness. There is no right CVA tenderness or left CVA tenderness.  Musculoskeletal:     Cervical back: Neck supple. No tenderness.     Right lower leg: No edema.     Left lower leg: No edema.  Skin:    General: Skin is warm.     Findings: Rash  (Maculopapular rash over b/l arms and legs) present.  Neurological:     General: No focal deficit present.     Mental Status: She is alert and oriented to person, place, and time.  Psychiatric:        Mood and Affect: Mood normal.        Behavior: Behavior normal.    BP 124/68 (BP Location: Right Arm, Patient Position: Sitting, Cuff Size: Normal)    Pulse 86    Resp 18    Ht _0  (1.549 m)    Wt 254 lb (115.2 kg)    SpO2 97%    BMI 47.99 kg/m  Wt Readings from Last 3 Encounters:  04/02/21 254 lb (115.2 kg)  01/17/21 252 lb 1.3 oz (114.3 kg)  08/29/20 231 lb (104.8 kg)    Lab Results  Component Value Date   TSH 1.950 06/30/2019   Lab Results  Component Value Date   WBC 6.5 01/18/2021   HGB 13.6 01/18/2021   HCT 43.8 01/18/2021   MCV 74 (L) 01/18/2021   PLT 326 01/18/2021   Lab Results  Component Value Date   NA 140 01/18/2021   K 4.6 01/18/2021   CO2 23 01/18/2021   GLUCOSE 93 01/18/2021   BUN 7 01/18/2021   CREATININE 0.88 01/18/2021   BILITOT 0.3 01/18/2021   ALKPHOS 113 01/18/2021   AST 13 01/18/2021   ALT 16 01/18/2021   PROT 7.3 01/18/2021   ALBUMIN 4.3 01/18/2021   CALCIUM 9.2 01/18/2021   ANIONGAP 9 07/22/2020   EGFR 88 01/18/2021   Lab Results  Component Value Date   CHOL 136 01/18/2021   Lab Results  Component Value Date   HDL 35 (L) 01/18/2021   Lab Results  Component Value Date   LDLCALC 88  01/18/2021   Lab Results  Component Value Date   TRIG 65 01/18/2021   Lab Results  Component Value Date   CHOLHDL 3.9 01/18/2021   Lab Results  Component Value Date   HGBA1C 6.3 (H) 01/18/2021      Assessment & Plan:   Problem List Items Addressed This Visit       Encounter for general adult medical examination with abnormal findings - Primary   Physical exam as documented. Fasting labs ordered. Advised to follow low-carb diet and focus on weight loss. Has had flu vaccine.     Relevant Orders  VITAMIN D 25 Hydroxy (Vit-D Deficiency,  Fractures)  TSH  Lipid panel  CMP14+EGFR  CBC with Differential/Platelet    Cardiovascular and Mediastinum   Essential hypertension    BP Readings from Last 1 Encounters:  04/02/21 124/68  Well-controlled with amlodipine 5 mg daily Counseled for compliance with the medications Advised to follow DASH diet and perform moderate exercise/walking at least 150 minutes/week        Respiratory   Obstructive sleep apnea of adult    Advised to stay compliant with CPAP, has nasal mask        Musculoskeletal and Integument   Irritant contact dermatitis    Diffuse, appears to be allergic in etiology, unknown trigger Kenalog cream prescribed Referred to Allergy clinic      Relevant Medications   triamcinolone cream (KENALOG) 0.1 %   Other Relevant Orders   Ambulatory referral to Allergy     Other   Fibromyalgia    On Cymbalta and tramadol Takes Klonopin for anxiety Followed by neurology-Ayeshia Florene Glen, NP      Prediabetes    Likely related to obesity Diet controlled, advised to continue diabetic diet Check HbA1c      Relevant Orders   Hemoglobin A1c    Meds ordered this encounter  Medications   triamcinolone cream (KENALOG) 0.1 %    Sig: Apply 1 application topically 2 (two) times daily.    Dispense:  45 g    Refill:  0    Follow-up: Return in about 6 months (around 09/30/2021) for HTN and prediabetes.    Lindell Spar, MD

## 2021-04-02 NOTE — Assessment & Plan Note (Signed)
BP Readings from Last 1 Encounters:  04/02/21 124/68   Well-controlled with amlodipine 5 mg daily Counseled for compliance with the medications Advised to follow DASH diet and perform moderate exercise/walking at least 150 minutes/week

## 2021-04-02 NOTE — Patient Instructions (Addendum)
Please continue taking medications as prescribed.  Please use CPAP regularly.  Please follow low carb diet and perform moderate exercise/walking at least 150 mins/week.  Please get fasting blood tests done before the next visit.

## 2021-04-02 NOTE — Assessment & Plan Note (Signed)
On Cymbalta and tramadol Takes Klonopin for anxiety Followed by neurology-Theresa Powell, NP 

## 2021-04-02 NOTE — Assessment & Plan Note (Signed)
Diffuse, appears to be allergic in etiology, unknown trigger Kenalog cream prescribed Referred to Allergy clinic

## 2021-04-02 NOTE — Assessment & Plan Note (Signed)
Advised to stay compliant with CPAP, has nasal mask

## 2021-05-10 ENCOUNTER — Ambulatory Visit (INDEPENDENT_AMBULATORY_CARE_PROVIDER_SITE_OTHER): Payer: Medicaid Other | Admitting: Allergy & Immunology

## 2021-05-10 ENCOUNTER — Other Ambulatory Visit: Payer: Self-pay

## 2021-05-10 VITALS — BP 122/74 | HR 98 | Temp 98.3°F | Resp 20 | Ht 61.0 in | Wt 252.8 lb

## 2021-05-10 DIAGNOSIS — L249 Irritant contact dermatitis, unspecified cause: Secondary | ICD-10-CM | POA: Diagnosis not present

## 2021-05-10 DIAGNOSIS — K9049 Malabsorption due to intolerance, not elsewhere classified: Secondary | ICD-10-CM

## 2021-05-10 DIAGNOSIS — J310 Chronic rhinitis: Secondary | ICD-10-CM | POA: Diagnosis not present

## 2021-05-10 NOTE — Progress Notes (Addendum)
NEW PATIENT  Date of Service/Encounter:  05/10/21  Consult requested by: [name], MD   Assessment:   Irritant contact dermatitis  Chronic rhinitis - with testing only positive to Alternaria  Food intolerance   Fibromyalgia - followed by her PCP and Dr. Peggye Form  Plan/Recommendations:   1. Irritant contact dermatitis - Testing was only positive to leaf mold (Alternaria), but I do not think that this explains your symptoms. - I want to see this the next time that it happens. - Take a pic OR make an appt to see [name].  - Continue with the topical steroid and hydroxyzine as needed. - I am going to get some labs to look at weird causes of rashes. - We will call you in 1-2 weeks with the results of the testing.   2. Chronic rhinitis - Testing today showed: outdoor molds. - Copy of test results provided.  - We are going to confirm with some blood work.  - Avoidance measures provided. - Start taking: Zyrtec (cetirizine) 10mg  tablet once daily and Flonase (fluticasone) one spray per nostril up to twice daily - You can use an extra dose of the antihistamine, if needed, for breakthrough symptoms.  - Consider nasal saline rinses 1-2 times daily to remove allergens from the nasal cavities as well as help with mucous clearance (this is especially helpful to do before the nasal sprays are given)  3. Food intolerance - Testing was negative to the most common foods as well s pineapple and banana. - There is a the low positive predictive value of food allergy testing and hence the high possibility of false positives. - In contrast, food allergy testing has a high negative predictive value, therefore if testing is negative we can be relatively assured that they are indeed negative.  - I do not think that you need to avoid any foods at this point in time.  4. Return in about 3 months (around 08/07/2021).    This note in its entirety was forwarded to the Provider who requested this  consultation.  Subjective:   Theresa Rosario is a 36 y.o. female presenting today for evaluation of  Chief Complaint  Patient presents with   Rash    Says she had a rash in various parts of her body. Unsure of what caused it. Says it made her eyes burn and itch.   Allergic Rhinitis     Says she has seasonal allergies.     Theresa Rosario has a history of the following: Patient Active Problem List   Diagnosis Date Noted   Encounter for general adult medical examination with abnormal findings 04/02/2021   Irritant contact dermatitis 04/02/2021   History of nephrolithiasis 01/17/2021   Essential hypertension 01/24/2020   Prediabetes 01/04/2020   Insomnia 01/04/2020   Hyperpigmented skin lesion 07/07/2019   Obstructive sleep apnea of adult 06/08/2019   Chronic low back pain 06/08/2019   GERD (gastroesophageal reflux disease) 03/31/2019   Family history of colon cancer in mother 02/09/2019   IDA (iron deficiency anemia) 12/30/2018   Chronic migraine 08/17/2018   Fibromyalgia 07/13/2012   Depression with anxiety 07/13/2012    History obtained from: chart review and patient.  Theresa Rosario was referred by [name], MD.     Theresa Rosario is a 36 y.o. female presenting for an evaluation of a rash . She has a rash that started a couple of weeks ago when she had the referral made. Her symptoms including eye  burning, runny nose, and a rash. She was given hydroxyzine and the symptoms would improve.   She had hydroxyzine already because she felt that she was reacting to tramadol, resulting in itching. She stopped taking the tramadol and the rash and itching continued. It has stopped over time. The last time that she took Tramadol was 2-3 weeks. In total, this lasted around 6 weeks. The rash did not exactly coincide with the tramadol use. Itching was happening over the entire body. She had triamcinolone cream that she used 2-3 times with improvement in the rash. It has  largely resolved at this point in time. She does not have any pictures of the rash.   She did not have throat closure. She had the itching and burning of her eyes. She did not have a full blow hive attack. This was just a rash. She had taken tramadol before without a problem.   She has a history of fibromyalgia and she uses the tramadol for pain control.  Allergic Rhinitis Symptom History: She has seasonal allergies that she treats with Benadryl. She does not use a nose spray.  Animals do not bother her at all. She has had sinus infection 3-4 times per year. She does not get antibiotics for each of those times; this varies.   Food Allergy Symptom History: She does eat all of the foods without a problem. Pineapples make her itch a bit. The more fresh, the worse that it is. Bananas and nuts can also cause some itching.   GERD Symptom History: She has pantoprazole which she has been on for around one year or so. This has helped a lot to control her symptoms.  Otherwise, there is no history of other atopic diseases, including drug allergies, stinging insect allergies, eczema, urticaria, or contact dermatitis. There is no significant infectious history. Vaccinations are up to date.    Past Medical History: Patient Active Problem List   Diagnosis Date Noted   Encounter for general adult medical examination with abnormal findings 04/02/2021   Irritant contact dermatitis 04/02/2021   History of nephrolithiasis 01/17/2021   Essential hypertension 01/24/2020   Prediabetes 01/04/2020   Insomnia 01/04/2020   Hyperpigmented skin lesion 07/07/2019   Obstructive sleep apnea of adult 06/08/2019   Chronic low back pain 06/08/2019   GERD (gastroesophageal reflux disease) 03/31/2019   Family history of colon cancer in mother 02/09/2019   IDA (iron deficiency anemia) 12/30/2018   Chronic migraine 08/17/2018   Fibromyalgia 07/13/2012   Depression with anxiety 07/13/2012    Medication List:  Allergies  as of 05/10/2021       Reactions   Amoxicillin Hives   Banana Itching   Tongue itching   Motrin [ibuprofen] Rash   MOTRIN (Brand only) causes this allergy (tolerates ADVIL brand)   Other Itching   Tree nuts cause tongue itching   Pineapple Itching   Tongue itching        Medication List        Accurate as of May 10, 2021 11:59 PM. If you have any questions, ask your nurse or doctor.          Acetaminophen Extra Strength 500 MG tablet Generic drug: acetaminophen Take 2 tablets (1,000 mg total) by mouth every 6 (six) hours.   amLODipine 5 MG tablet Commonly known as: Norvasc Take 1 tablet (5 mg total) by mouth daily.   clonazePAM 0.5 MG tablet Commonly known as: KLONOPIN Take 0.5 mg by mouth 2 (two) times daily as needed.  cyclobenzaprine 10 MG tablet Commonly known as: FLEXERIL Take 1 tablet by mouth 2 (two) times daily as needed.   DULoxetine 30 MG capsule Commonly known as: Cymbalta Take 1 capsule (30 mg total) by mouth daily.   hydrOXYzine 25 MG tablet Commonly known as: ATARAX Take 25 mg by mouth 3 (three) times daily as needed.   Iron (Ferrous Sulfate) 325 (65 Fe) MG Tabs Take 325 mg by mouth daily.   norethindrone 0.35 MG tablet Commonly known as: MICRONOR Take 1 tablet (0.35 mg total) by mouth daily.   pantoprazole 40 MG tablet Commonly known as: PROTONIX Take 1 tablet (40 mg total) by mouth daily.   traMADol 50 MG tablet Commonly known as: ULTRAM Take 100 mg by mouth every 8 (eight) hours as needed.   triamcinolone cream 0.1 % Commonly known as: KENALOG Apply 1 application topically 2 (two) times daily.        Birth History: non-contributory  Developmental History: non-contributory  Past Surgical History: Past Surgical History:  Procedure Laterality Date   DENTAL SURGERY     WISDOM TOOTH EXTRACTION Bilateral      Family History: Family History  Problem Relation Age of Onset   Diabetes Paternal Grandmother     Hypertension Paternal Grandmother    Diabetes Maternal Grandmother    Heart disease Maternal Grandfather    Hypertension Father    Diabetes Mother    Hypertension Mother    Parkinson's disease Mother    Colon cancer Mother [id number]   Coronary artery disease Other    Diabetes Other      Social History: Mila lives at home with her two kids and their dad. There is an indoor dog. Her husband "job hops".  They live in a house that is 36 years old.  There is linoleum throughout the home.  They have electric heating and central cooling.  They do have fans inside of the home.  There is a dog in the home.  There are no dust mite covers on the bedding.  There is tobacco exposure inside and outside of the home.  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  She has done this for 13 years.  She does have a HEPA filter in the home.  She is exposed to fumes, chemicals, and dust.   Review of Systems  Constitutional: Negative.  Negative for chills, fever, malaise/fatigue and weight loss.  HENT:  Positive for congestion. Negative for ear discharge, ear pain and sinus pain.   Eyes:  Negative for pain, discharge and redness.  Respiratory:  Negative for cough, sputum production, shortness of breath and wheezing.   Cardiovascular: Negative.  Negative for chest pain and palpitations.  Gastrointestinal:  Negative for abdominal pain, constipation, diarrhea, heartburn, nausea and vomiting.  Skin:  Positive for itching and rash.  Neurological:  Negative for dizziness and headaches.  Endo/Heme/Allergies:  Negative for environmental allergies. Does not bruise/bleed easily.      Objective:   Blood pressure 122/74, pulse 98, temperature 98.3 F (36.8 C), temperature source Temporal, resp. rate 20, height 5\' 1"  (1.549 m), weight 252 lb 12.8 oz (114.7 kg), SpO2 100 %. Body mass index is 47.77 kg/m.   Physical Exam Vitals reviewed.  Constitutional:      Appearance: She is well-developed. She  is obese.  HENT:     Head: Normocephalic and atraumatic.     Right Ear: Tympanic membrane, ear canal and external ear normal. No drainage, swelling or tenderness.  Tympanic membrane is not injected, scarred, erythematous, retracted or bulging.     Left Ear: Tympanic membrane, ear canal and external ear normal. No drainage, swelling or tenderness. Tympanic membrane is not injected, scarred, erythematous, retracted or bulging.     Nose: No nasal deformity, septal deviation, mucosal edema or rhinorrhea.     Right Turbinates: Enlarged, swollen and pale.     Left Turbinates: Enlarged, swollen and pale.     Right Sinus: No maxillary sinus tenderness or frontal sinus tenderness.     Left Sinus: No maxillary sinus tenderness or frontal sinus tenderness.     Mouth/Throat:     Mouth: Mucous membranes are not pale and not dry.     Pharynx: Uvula midline.  Eyes:     General:        Right eye: No discharge.        Left eye: No discharge.     Conjunctiva/sclera: Conjunctivae normal.     Right eye: Right conjunctiva is not injected. No chemosis.    Left eye: Left conjunctiva is not injected. No chemosis.    Pupils: Pupils are equal, round, and reactive to light.  Cardiovascular:     Rate and Rhythm: Normal rate and regular rhythm.     Heart sounds: Normal heart sounds.  Pulmonary:     Effort: Pulmonary effort is normal. No tachypnea, accessory muscle usage or respiratory distress.     Breath sounds: Normal breath sounds. No wheezing, rhonchi or rales.     Comments: Moving air well in all lung fields. No increased work of breathing noted.  Chest:     Chest wall: No tenderness.  Abdominal:     Tenderness: There is no abdominal tenderness. There is no guarding or rebound.  Lymphadenopathy:     Head:     Right side of head: No submandibular, tonsillar or occipital adenopathy.     Left side of head: No submandibular, tonsillar or occipital adenopathy.     Cervical: No cervical adenopathy.  Skin:     General: Skin is warm.     Capillary Refill: Capillary refill takes less than 2 seconds.     Coloration: Skin is not pale.     Findings: No abrasion, erythema, petechiae or rash. Rash is not papular, urticarial or vesicular.  Neurological:     Mental Status: She is alert.     Diagnostic studies:  Allergy Studies:     Airborne Adult Perc - 05/10/21 1406     Time Antigen Placed 1406    Allergen Manufacturer Waynette Buttery    Location Back    Number of Test 59    1. Control-Buffer 50% Glycerol Negative    2. Control-Histamine 1 mg/ml 2+    3. Albumin saline Negative    4. Bahia Negative    5. [place] Negative    6. Johnson Negative    7. [place] Blue Negative    8. Meadow Fescue Negative    9. Perennial Rye Negative    10. Sweet Vernal Negative    11. Timothy Negative    12. Cocklebur Negative    13. Burweed Marshelder Negative    14. Ragweed, short Negative    15. Ragweed, Giant Negative    16. Plantain,  English Negative    17. Lamb's Quarters Negative    18. Sheep Sorrell Negative    19. Rough Pigweed Negative    20. Marsh Elder, Rough Negative    21. Mugwort, Common Negative    22.  Ash mix Negative    23. Birch mix Negative    24. Beech American Negative    25. Box, Elder Negative    26. Cedar, red Negative    27. Cottonwood, [name][place] Negative    28. Elm mix Negative    29. Hickory Negative    30. Maple mix Negative    31. Oak, [name] mix Negative    32. Pecan Pollen Negative    33. Pine mix Negative    34. Sycamore Eastern Negative    35. Walnut, Black Pollen Negative    36. Alternaria alternata 2+    37. Cladosporium Herbarum Negative    38. Aspergillus mix Negative    39. Penicillium mix Negative    40. Bipolaris sorokiniana (Helminthosporium) Negative    41. Drechslera spicifera (Curvularia) Negative    42. Mucor plumbeus Negative    43. Fusarium moniliforme Negative    44. Aureobasidium pullulans (pullulara) Negative    45. Rhizopus oryzae Negative    46.  Botrytis cinera Negative    47. Epicoccum nigrum Negative    48. Phoma betae Negative    49. Candida Albicans Negative    50. Trichophyton mentagrophytes Negative    51. Mite, D Farinae  5,000 AU/ml Negative    52. Mite, D Pteronyssinus  5,000 AU/ml Negative    53. Cat Hair 10,000 BAU/ml Negative    54.  Dog Epithelia Negative    55. Mixed Feathers Negative    56. Horse Epithelia Negative    57. Cockroach, German Negative    58. Mouse Negative    59. Tobacco Leaf Negative             Food Adult Perc - 05/10/21 1400     Time Antigen Placed 1406    Allergen Manufacturer Waynette Buttery    Location Back    Number of allergen test 20    Control-Histamine 1 mg/ml 2+    1. Peanut Negative    2. Soybean Negative    3. Wheat Negative    4. Sesame Negative    5. Milk, cow Negative    6. Egg White, Chicken Negative    7. Casein Negative    8. Shellfish Mix Negative    9. Fish Mix Negative    10. Cashew Negative    11. Pecan Food Negative    12. Walnut Food Negative    13. Almond Negative    14. Hazelnut Negative    15. [place] nut Negative    16. Coconut Negative    17. Pistachio Negative    57. Banana Negative    63. Pineapple Negative             Allergy testing results were read and interpreted by myself, documented by clinical staff.         Malachi Bonds, MD [facility]

## 2021-05-10 NOTE — Patient Instructions (Addendum)
1. Irritant contact dermatitis - Testing was only positive to leaf mold (Alternaria), but I do not think that this explains your symptoms. - I want to see this the next time that it happens. - Take a pic OR make an appt to see [name].  - Continue with the topical steroid and hydroxyzine as needed. - I am going to get some labs to look at weird causes of rashes. - We will call you in 1-2 weeks with the results of the testing.   2. Chronic rhinitis - Testing today showed: outdoor molds. - Copy of test results provided.  - We are going to confirm with some blood work.  - Avoidance measures provided. - Start taking: Zyrtec (cetirizine) 10mg  tablet once daily and Flonase (fluticasone) one spray per nostril up to twice daily - You can use an extra dose of the antihistamine, if needed, for breakthrough symptoms.  - Consider nasal saline rinses 1-2 times daily to remove allergens from the nasal cavities as well as help with mucous clearance (this is especially helpful to do before the nasal sprays are given)  3. Food intolerance - Testing was negative to the most common foods as well s pineapple and banana. - There is a the low positive predictive value of food allergy testing and hence the high possibility of false positives. - In contrast, food allergy testing has a high negative predictive value, therefore if testing is negative we can be relatively assured that they are indeed negative.  - I do not think that you need to avoid any foods at this point in time.  4. Return in about 3 months (around 08/07/2021).    Please inform 08/09/2021 of any Emergency Department visits, hospitalizations, or changes in symptoms. Call us before going to the ED for breathing or allergy symptoms since we might be able to fit you in for a sick visit. Feel free to contact us anytime with any questions, problems, or concerns.  It was a pleasure to meet you today!  Websites that have reliable patient information: 1. American  Academy of Asthma, Allergy, and Immunology: www.aaaai.org 2. Food Allergy Research and Education (FARE): foodallergy.org 3. Mothers of Asthmatics: http://www.asthmacommunitynetwork.org 4. American College of Allergy, Asthma, and Immunology: www.acaai.org   COVID-19 Vaccine Information can be found at: [name] For questions related to vaccine distribution or appointments, please email [email or web address] or call [phone].   We realize that you might be concerned about having an allergic reaction to the COVID19 vaccines. To help with that concern, WE ARE OFFERING THE COVID19 VACCINES IN OUR OFFICE! Ask the front desk for dates!     Like [phone] on [name] and Instagram for our latest updates!      A healthy democracy works best when [employer] participate! Make sure you are registered to vote! If you have moved or changed any of your contact information, you will need to get this updated before voting!  In some cases, you MAY be able to register to vote online: [employer]        Airborne Adult Perc - 05/10/21 1406     Time Antigen Placed 1406    Allergen Manufacturer 05/12/21    Location Back    Number of Test 59    1. Control-Buffer 50% Glycerol Negative    2. Control-Histamine 1 mg/ml 2+    3. Albumin saline Negative    4. Bahia Negative    5. Waynette Buttery Negative    6. Johnson Negative    7. [facility]  Blue Negative    8. Meadow Fescue Negative    9. Perennial Rye Negative    10. Sweet Vernal Negative    11. Timothy Negative    12. Cocklebur Negative    13. Burweed Marshelder Negative    14. Ragweed, short Negative    15. Ragweed, Giant Negative    16. Plantain,  English Negative    17. Lamb's Quarters Negative    18. Sheep Sorrell Negative    19. Rough Pigweed Negative    20. Marsh Elder, Rough Negative    21. Mugwort, Common Negative    22. Ash mix Negative     23. Birch mix Negative    24. Beech American Negative    25. Box, Elder Negative    26. Cedar, red Negative    27. Cottonwood, [name]iss[place] Negative    28. Elm mix Negative    29. Hickory Negative    30. Maple mix Negative    31. Oak, [name] mix Negative    32. Pecan Pollen Negative    33. Pine mix Negative    34. Sycamore Eastern Negative    35. Walnut, Black Pollen Negative    36. Alternaria alternata 2+    37. Cladosporium Herbarum Negative    38. Aspergillus mix Negative    39. Penicillium mix Negative    40. Bipolaris sorokiniana (Helminthosporium) Negative    41. Drechslera spicifera (Curvularia) Negative    42. Mucor plumbeus Negative    43. Fusarium moniliforme Negative    44. Aureobasidium pullulans (pullulara) Negative    45. Rhizopus oryzae Negative    46. Botrytis cinera Negative    47. Epicoccum nigrum Negative    48. Phoma betae Negative    49. Candida Albicans Negative    50. Trichophyton mentagrophytes Negative    51. Mite, D Farinae  5,000 AU/ml Negative    52. Mite, D Pteronyssinus  5,000 AU/ml Negative    53. Cat Hair 10,000 BAU/ml Negative    54.  Dog Epithelia Negative    55. Mixed Feathers Negative    56. Horse Epithelia Negative    57. Cockroach, German Negative    58. Mouse Negative    59. Tobacco Leaf Negative             Food Adult Perc - 05/10/21 1400     Time Antigen Placed 1406    Allergen Manufacturer Waynette Buttery    Location Back    Number of allergen test 20    Control-Histamine 1 mg/ml 2+    1. Peanut Negative    2. Soybean Negative    3. Wheat Negative    4. Sesame Negative    5. Milk, cow Negative    6. Egg White, Chicken Negative    7. Casein Negative    8. Shellfish Mix Negative    9. Fish Mix Negative    10. Cashew Negative    11. Pecan Food Negative    12. Walnut Food Negative    13. Almond Negative    14. Hazelnut Negative    15. [place] nut Negative    16. Coconut Negative    17. Pistachio Negative    57. Banana  Negative    63. Pineapple Negative            Control of Mold Allergen   Mold and fungi can grow on a variety of surfaces provided certain temperature and moisture conditions exist.  Outdoor molds grow on plants, decaying vegetation and soil.  The major outdoor mold, Alternaria and Cladosporium, are found in very high numbers during hot and dry conditions.  Generally, a late Summer - Fall peak is seen for common outdoor fungal spores.  Rain will temporarily lower outdoor mold spore count, but counts rise rapidly when the rainy period ends.  The most important indoor molds are Aspergillus and Penicillium.  Dark, humid and poorly ventilated basements are ideal sites for mold growth.  The next most common sites of mold growth are the bathroom and the kitchen.  Outdoor (Seasonal) Mold Control  Positive outdoor molds via skin testing: Alternaria  Use air conditioning and keep windows closed Avoid exposure to decaying vegetation. Avoid leaf raking. Avoid grain handling. Consider wearing a face mask if working in moldy areas.

## 2021-05-13 ENCOUNTER — Encounter: Payer: Self-pay | Admitting: Allergy & Immunology

## 2021-05-17 DIAGNOSIS — J310 Chronic rhinitis: Secondary | ICD-10-CM | POA: Diagnosis not present

## 2021-05-17 DIAGNOSIS — L249 Irritant contact dermatitis, unspecified cause: Secondary | ICD-10-CM | POA: Diagnosis not present

## 2021-05-23 LAB — ALPHA-GAL PANEL
Allergen Lamb IgE: <0.1 kU/L
Beef IgE: <0.1 kU/L
IgE (Immunoglobulin E), Serum: 79 IU/mL (ref 6–495)
O215-IgE Alpha-Gal: <0.1 kU/L
Pork IgE: <0.1 kU/L

## 2021-05-24 ENCOUNTER — Encounter: Payer: Self-pay | Admitting: Allergy & Immunology

## 2021-05-24 LAB — CBC WITH DIFFERENTIAL
Basophils Absolute: 0 10*3/uL (ref 0.0–0.2)
Basos: 0 %
EOS (ABSOLUTE): 0.1 10*3/uL (ref 0.0–0.4)
Eos: 1 %
Hematocrit: 44 % (ref 34.0–46.6)
Hemoglobin: 13.8 g/dL (ref 11.1–15.9)
Immature Grans (Abs): 0 10*3/uL (ref 0.0–0.1)
Immature Granulocytes: 0 %
Lymphocytes Absolute: 3.5 10*3/uL — ABNORMAL HIGH (ref 0.7–3.1)
Lymphs: 40 %
MCH: 24.3 pg — ABNORMAL LOW (ref 26.6–33.0)
MCHC: 31.4 g/dL — ABNORMAL LOW (ref 31.5–35.7)
MCV: 77 fL — ABNORMAL LOW (ref 79–97)
Monocytes Absolute: 0.6 10*3/uL (ref 0.1–0.9)
Monocytes: 7 %
Neutrophils Absolute: 4.4 10*3/uL (ref 1.4–7.0)
Neutrophils: 52 %
RBC: 5.69 x10E6/uL — ABNORMAL HIGH (ref 3.77–5.28)
RDW: 15.5 % — ABNORMAL HIGH (ref 11.7–15.4)
WBC: 8.7 10*3/uL (ref 3.4–10.8)

## 2021-05-24 LAB — CMP14+EGFR
ALT: 17 IU/L (ref 0–32)
AST: 13 IU/L (ref 0–40)
Albumin/Globulin Ratio: 1.7 (ref 1.2–2.2)
Albumin: 4.5 g/dL (ref 3.8–4.8)
Alkaline Phosphatase: 103 IU/L (ref 44–121)
BUN/Creatinine Ratio: 9 (ref 9–23)
BUN: 7 mg/dL (ref 6–20)
Bilirubin Total: <0.2 mg/dL (ref 0.0–1.2)
CO2: 22 mmol/L (ref 20–29)
Calcium: 8.8 mg/dL (ref 8.7–10.2)
Chloride: 101 mmol/L (ref 96–106)
Creatinine, Ser: 0.76 mg/dL (ref 0.57–1.00)
Globulin, Total: 2.7 g/dL (ref 1.5–4.5)
Glucose: 79 mg/dL (ref 70–99)
Potassium: 4.2 mmol/L (ref 3.5–5.2)
Sodium: 140 mmol/L (ref 134–144)
Total Protein: 7.2 g/dL (ref 6.0–8.5)
eGFR: 105 mL/min/{1.73_m2} (ref >59)

## 2021-05-24 LAB — THYROID ANTIBODIES
Thyroglobulin Antibody: <1 IU/mL (ref 0.0–0.9)
Thyroperoxidase Ab SerPl-aCnc: 22 IU/mL (ref 0–34)

## 2021-05-24 LAB — ALLERGENS W/COMP RFLX AREA 2
Alternaria Alternata IgE: 3.63 kU/L — AB
Aspergillus Fumigatus IgE: <0.1 kU/L
Bermuda Grass IgE: <0.1 kU/L
Cedar, Mountain IgE: <0.1 kU/L
Cladosporium Herbarum IgE: <0.1 kU/L
Cockroach, German IgE: <0.1 kU/L
Common Silver Birch IgE: <0.1 kU/L
Cottonwood IgE: <0.1 kU/L
D Farinae IgE: <0.1 kU/L
D Pteronyssinus IgE: <0.1 kU/L
E001-IgE Cat Dander: <0.1 kU/L
E005-IgE Dog Dander: <0.1 kU/L
Elm, American IgE: <0.1 kU/L
IgE (Immunoglobulin E), Serum: 75 IU/mL (ref 6–495)
Johnson Grass IgE: <0.1 kU/L
Maple/Box Elder IgE: <0.1 kU/L
Mouse Urine IgE: <0.1 kU/L
Oak, White IgE: <0.1 kU/L
Pecan, Hickory IgE: <0.1 kU/L
Penicillium Chrysogen IgE: <0.1 kU/L
Pigweed, Rough IgE: <0.1 kU/L
Ragweed, Short IgE: <0.1 kU/L
Sheep Sorrel IgE Qn: <0.1 kU/L
Timothy Grass IgE: <0.1 kU/L
White Mulberry IgE: <0.1 kU/L

## 2021-05-24 LAB — CHRONIC URTICARIA: cu index: 2.5 (ref <10)

## 2021-05-24 LAB — ANTINUCLEAR ANTIBODIES, IFA: ANA Titer 1: NEGATIVE

## 2021-05-24 LAB — TRYPTASE: Tryptase: 5.3 ug/L (ref 2.2–13.2)

## 2021-05-24 LAB — SEDIMENTATION RATE

## 2021-05-24 LAB — C-REACTIVE PROTEIN: CRP: 26 mg/L — ABNORMAL HIGH (ref 0–10)

## 2021-06-05 IMAGING — US US MFM FETAL BPP W/O NON-STRESS
1 series · 15 of 19 positions shown · non-contrast
Comparison: none

[Series 1: us mfm fetal bpp w/o non-stress · 19 acquisitions, 15 frames shown]
[im 1/19]
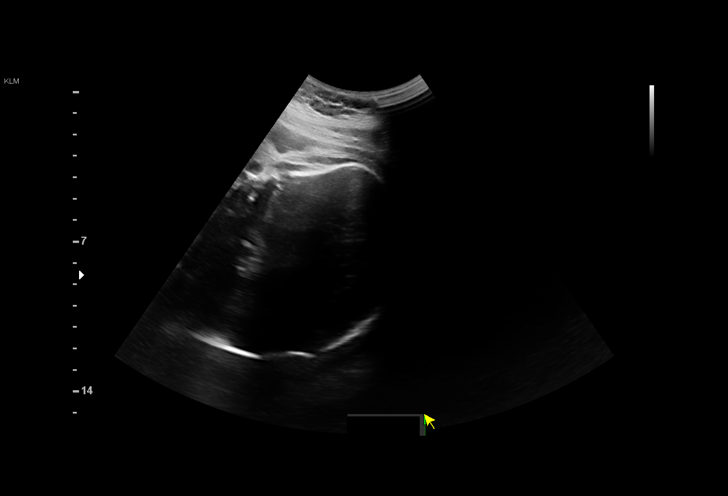
[im 2/19]
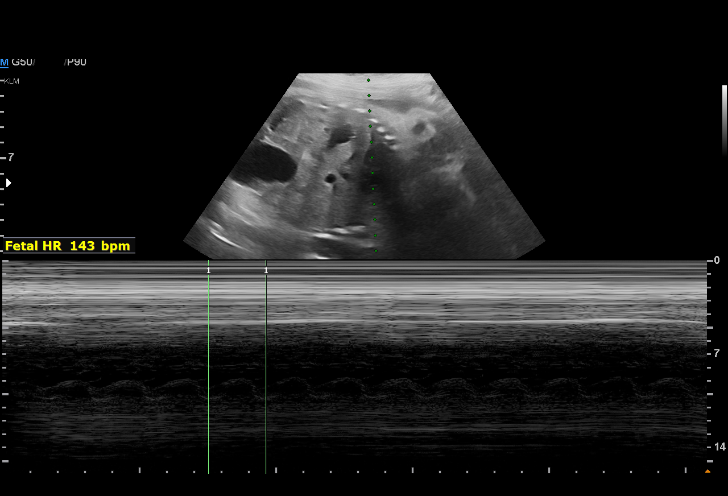
[im 4/19]
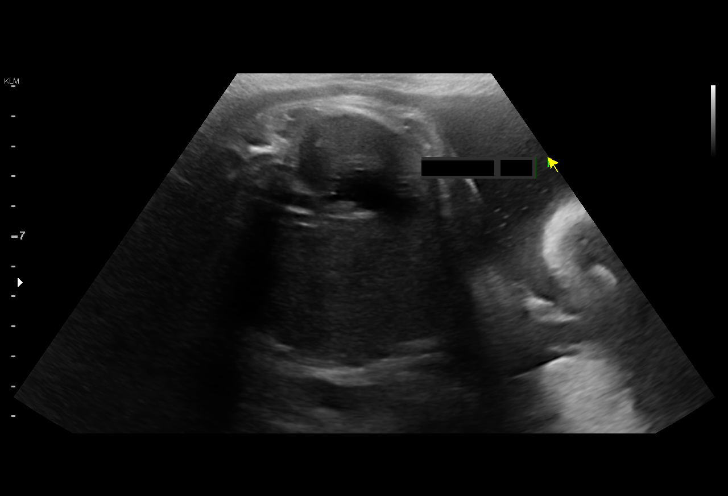
[im 5/19]
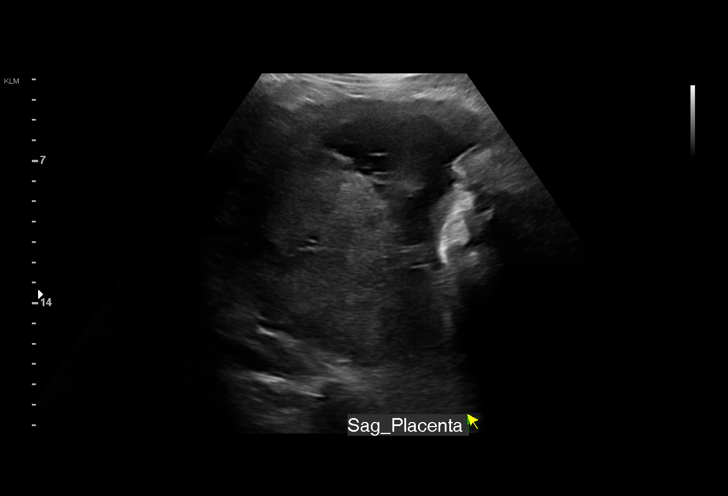
[im 6/19]
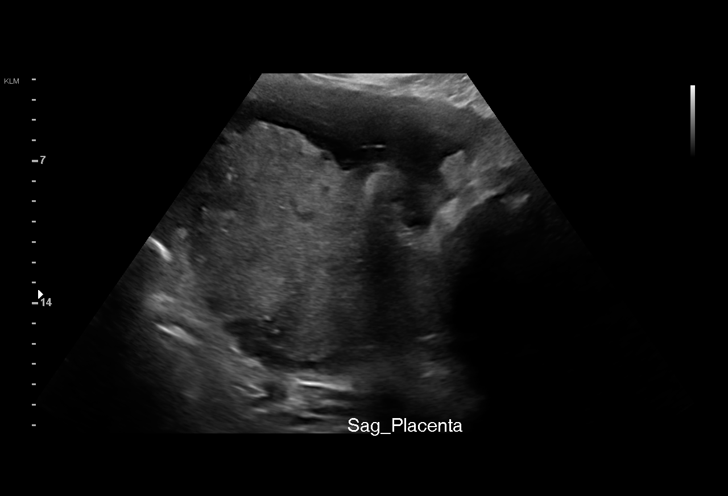
[im 7/19]
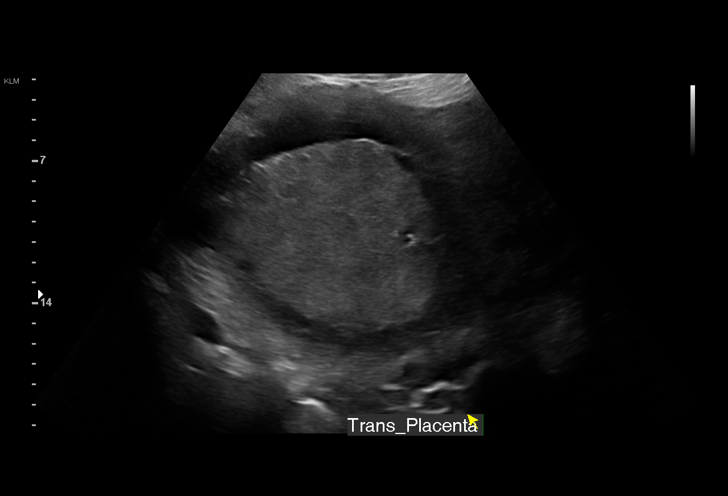
[im 9/19]
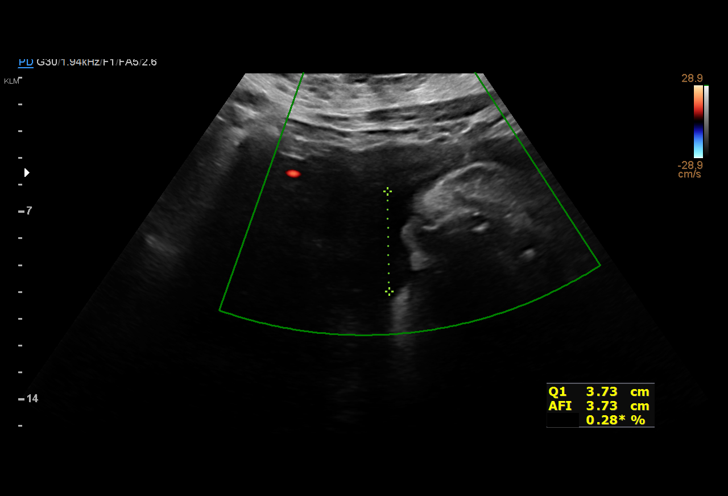
[im 10/19]
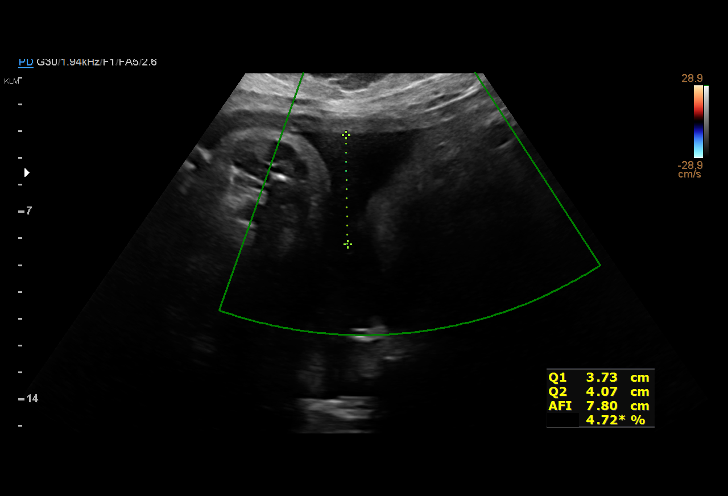
[im 11/19]
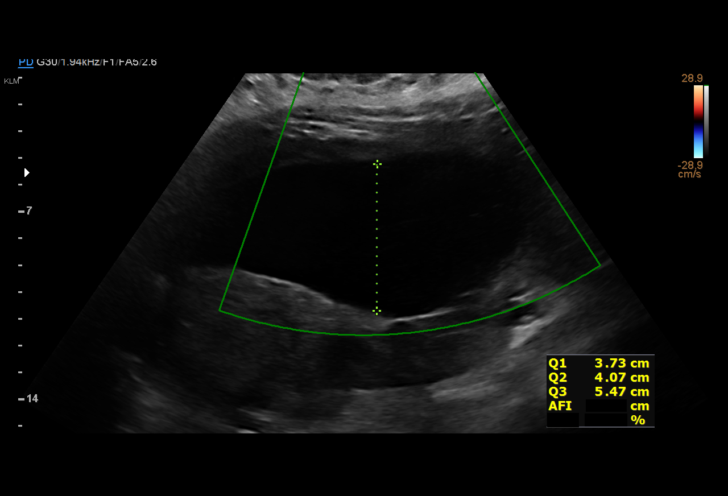
[im 13/19]
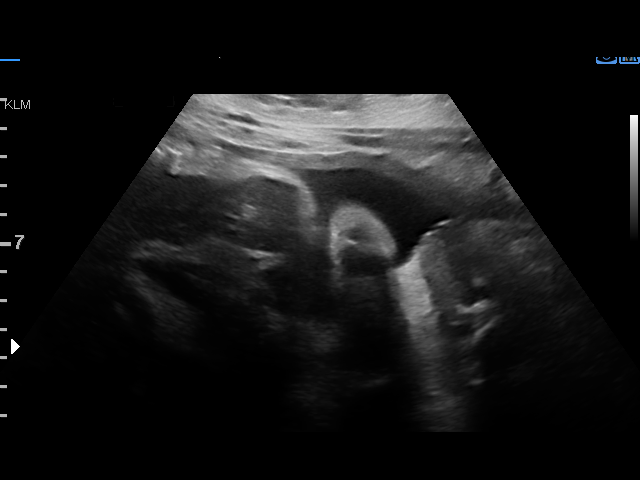
[im 14/19]
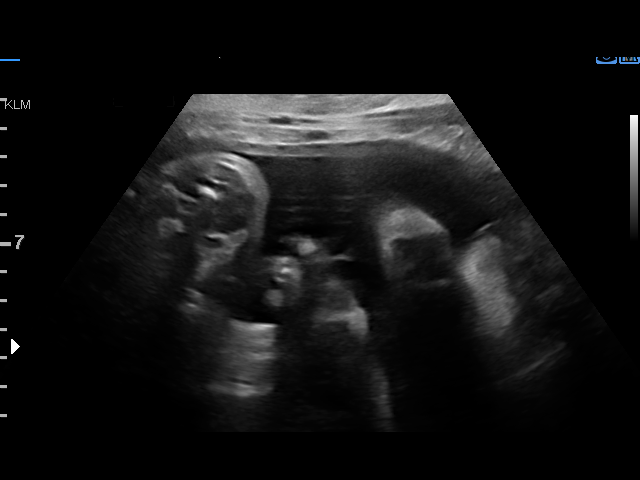
[im 15/19]
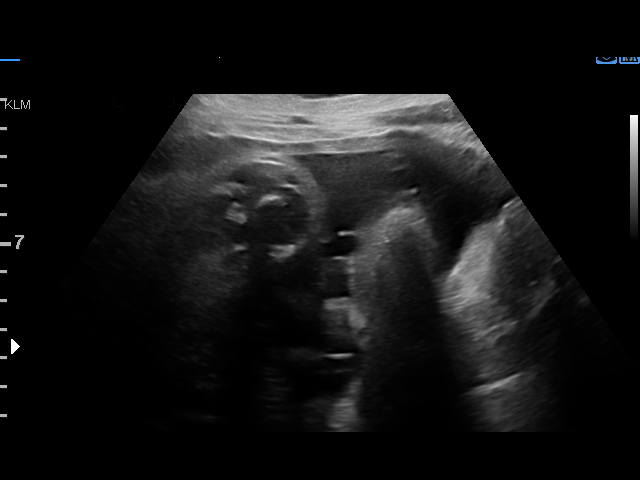
[im 16/19]
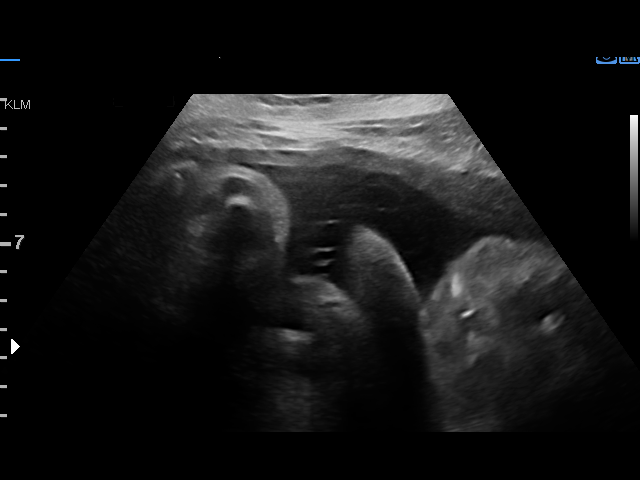
[im 18/19]
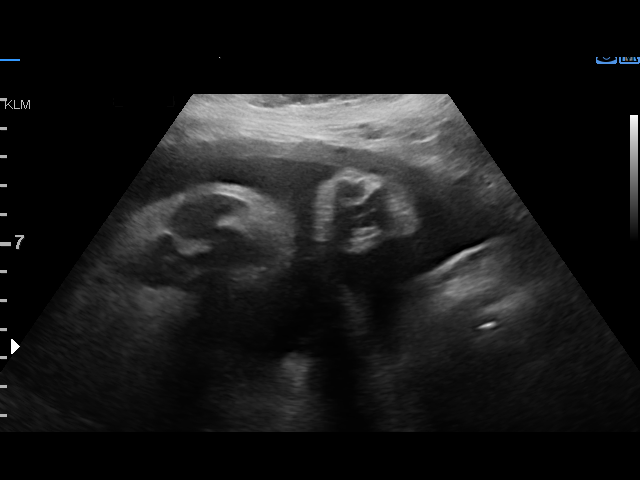
[im 19/19]
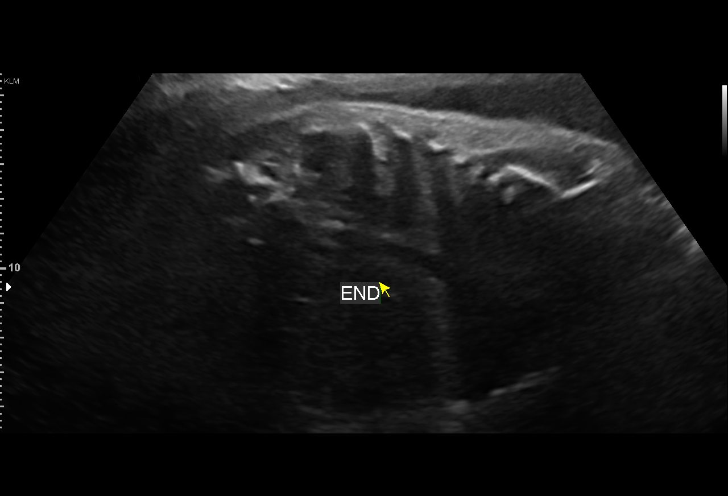

[15 of 19 positions shown; findings below may reference images not displayed]

[REDACTED]
                   PITO CNM

Indications

 Non-reactive NST
 35 weeks gestation of pregnancy
 Abnormal finding on antenatal screening
 ([DATE] in office today)
 Hypertension - Chronic/Pre-existing
Fetal Evaluation

 Num Of Fetuses:         1
 Fetal Heart Rate(bpm):  143
 Cardiac Activity:       Observed
 Presentation:           Cephalic
 Placenta:               Posterior

 Amniotic Fluid
 AFI FV:      Within normal limits

 AFI Sum(cm)     %Tile       Largest Pocket(cm)
 16.41           60

 RUQ(cm)       RLQ(cm)       LUQ(cm)        LLQ(cm)

Biophysical Evaluation

 Amniotic F.V:   Within normal limits       F. Tone:        Observed
 F. Movement:    Observed                   Score:          [DATE]
 F. Breathing:   Not Observed
OB History

 Gravidity:    3         Term:   1        Prem:   0        SAB:   1
 TOP:          0       Ectopic:  0        Living: 1
Gestational Age

 Best:          35w 0d     Det. By:  Early Ultrasound         EDD:   08/07/20
                                     (01/10/20)
Anatomy

 Stomach:               Appears normal, left   Bladder:                Appears normal
                        sided
Impression

 Antenatal testing performed given chronic hypertension and
 non-reactive NST.
 The biophysical profile was [DATE] with good fetal movement and
 amniotic fluid volume.
Recommendations

 Clinical correlation recommended.

## 2021-07-18 ENCOUNTER — Ambulatory Visit: Payer: Medicaid Other | Admitting: Internal Medicine

## 2021-07-23 ENCOUNTER — Telehealth: Payer: Medicaid Other | Admitting: Physician Assistant

## 2021-07-23 DIAGNOSIS — Z79899 Other long term (current) drug therapy: Secondary | ICD-10-CM | POA: Diagnosis not present

## 2021-07-23 DIAGNOSIS — B9689 Other specified bacterial agents as the cause of diseases classified elsewhere: Secondary | ICD-10-CM | POA: Diagnosis not present

## 2021-07-23 DIAGNOSIS — F419 Anxiety disorder, unspecified: Secondary | ICD-10-CM | POA: Diagnosis not present

## 2021-07-23 DIAGNOSIS — M542 Cervicalgia: Secondary | ICD-10-CM | POA: Diagnosis not present

## 2021-07-23 DIAGNOSIS — M545 Low back pain, unspecified: Secondary | ICD-10-CM | POA: Diagnosis not present

## 2021-07-23 DIAGNOSIS — J019 Acute sinusitis, unspecified: Secondary | ICD-10-CM

## 2021-07-23 MED ORDER — AZITHROMYCIN 250 MG PO TABS: ORAL_TABLET | ORAL | 0 refills | Status: AC

## 2021-07-23 MED ORDER — BENZONATATE 100 MG PO CAPS: 100.0000 mg | ORAL_CAPSULE | Freq: Three times a day (TID) | ORAL | 0 refills | Status: DC | PRN

## 2021-07-23 NOTE — Patient Instructions (Signed)
?Nani Ravens, thank you for joining [name], PA-C for today's virtual visit.  While this provider is not your primary care provider (PCP), if your PCP is located in our provider database this encounter information will be shared with them immediately following your visit. ? ?Consent: ?(Patient) Theresa Rosario provided verbal consent for this virtual visit at the beginning of the encounter. ? ?Current Medications: ? ?Current Outpatient Medications:  ?  acetaminophen (TYLENOL) 500 MG tablet, Take 2 tablets (1,000 mg total) by mouth every 6 (six) hours., Disp: 30 tablet, Rfl: 0 ?  amLODipine (NORVASC) 5 MG tablet, Take 1 tablet (5 mg total) by mouth daily., Disp: 30 tablet, Rfl: 6 ?  clonazePAM (KLONOPIN) 0.5 MG tablet, Take 0.5 mg by mouth 2 (two) times daily as needed., Disp: , Rfl:  ?  cyclobenzaprine (FLEXERIL) 10 MG tablet, Take 1 tablet by mouth 2 (two) times daily as needed., Disp: , Rfl:  ?  DULoxetine (CYMBALTA) 30 MG capsule, Take 1 capsule (30 mg total) by mouth daily., Disp: 30 capsule, Rfl: 3 ?  hydrOXYzine (ATARAX) 25 MG tablet, Take 25 mg by mouth 3 (three) times daily as needed., Disp: , Rfl:  ?  Iron, Ferrous Sulfate, 325 (65 Fe) MG TABS, Take 325 mg by mouth daily., Disp: 30 tablet, Rfl: 3 ?  norethindrone (MICRONOR) 0.35 MG tablet, Take 1 tablet (0.35 mg total) by mouth daily., Disp: 28 tablet, Rfl: 12 ?  pantoprazole (PROTONIX) 40 MG tablet, Take 1 tablet (40 mg total) by mouth daily., Disp: 90 tablet, Rfl: 1 ?  traMADol (ULTRAM) 50 MG tablet, Take 100 mg by mouth every 8 (eight) hours as needed., Disp: , Rfl:  ?  triamcinolone cream (KENALOG) 0.1 %, Apply 1 application topically 2 (two) times daily., Disp: 45 g, Rfl: 0  ? ?Medications ordered in this encounter:  ?No orders of the defined types were placed in this encounter. ?  ? ?*If you need refills on other medications prior to your next appointment, please contact your pharmacy* ? ?Follow-Up: ?Call back or seek an  in-person evaluation if the symptoms worsen or if the condition fails to improve as anticipated. ? ?Other Instructions ?Please take antibiotic as directed.  Increase fluid intake.  Use Saline nasal spray.  Take a daily multivitamin. Use the Tessalon as directed for cough. Ok to continue the loratadine. Consider starting Mucinex-DM over-the-counter.  Place a humidifier in the bedroom.  Please call or return clinic if symptoms are not improving. ? ?Sinusitis ?Sinusitis is redness, soreness, and swelling (inflammation) of the paranasal sinuses. Paranasal sinuses are air pockets within the bones of your face (beneath the eyes, the middle of the forehead, or above the eyes). In healthy paranasal sinuses, mucus is able to drain out, and air is able to circulate through them by way of your nose. However, when your paranasal sinuses are inflamed, mucus and air can become trapped. This can allow bacteria and other germs to grow and cause infection. ?Sinusitis can develop quickly and last only a short time (acute) or continue over a long period (chronic). Sinusitis that lasts for more than 12 weeks is considered chronic.  ?CAUSES  ?Causes of sinusitis include: ?Allergies. ?Structural abnormalities, such as displacement of the cartilage that separates your nostrils (deviated septum), which can decrease the air flow through your nose and sinuses and affect sinus drainage. ?Functional abnormalities, such as when the small hairs (cilia) that line your sinuses and help remove mucus do not work properly or  are not present. ?SYMPTOMS  ?Symptoms of acute and chronic sinusitis are the same. The primary symptoms are pain and pressure around the affected sinuses. Other symptoms include: ?Upper toothache. ?Earache. ?Headache. ?Bad breath. ?Decreased sense of smell and taste. ?A cough, which worsens when you are lying flat. ?Fatigue. ?Fever. ?Thick drainage from your nose, which often is green and may contain pus (purulent). ?Swelling and  warmth over the affected sinuses. ?DIAGNOSIS  ?Your caregiver will perform a physical exam. During the exam, your caregiver may: ?Look in your nose for signs of abnormal growths in your nostrils (nasal polyps). ?Tap over the affected sinus to check for signs of infection. ?View the inside of your sinuses (endoscopy) with a special imaging device with a light attached (endoscope), which is inserted into your sinuses. ?If your caregiver suspects that you have chronic sinusitis, one or more of the following tests may be recommended: ?Allergy tests. ?Nasal culture A sample of mucus is taken from your nose and sent to a lab and screened for bacteria. ?Nasal cytology A sample of mucus is taken from your nose and examined by your caregiver to determine if your sinusitis is related to an allergy. ?TREATMENT  ?Most cases of acute sinusitis are related to a viral infection and will resolve on their own within 10 days. Sometimes medicines are prescribed to help relieve symptoms (pain medicine, decongestants, nasal steroid sprays, or saline sprays).  ?However, for sinusitis related to a bacterial infection, your caregiver will prescribe antibiotic medicines. These are medicines that will help kill the bacteria causing the infection.  ?Rarely, sinusitis is caused by a fungal infection. In theses cases, your caregiver will prescribe antifungal medicine. ?For some cases of chronic sinusitis, surgery is needed. Generally, these are cases in which sinusitis recurs more than 3 times per year, despite other treatments. ?HOME CARE INSTRUCTIONS  ?Drink plenty of water. Water helps thin the mucus so your sinuses can drain more easily. ?Use a humidifier. ?Inhale steam 3 to 4 times a day (for example, sit in the bathroom with the shower running). ?Apply a warm, moist washcloth to your face 3 to 4 times a day, or as directed by your caregiver. ?Use saline nasal sprays to help moisten and clean your sinuses. ?Take over-the-counter or  prescription medicines for pain, discomfort, or fever only as directed by your caregiver. ?SEEK IMMEDIATE MEDICAL CARE IF: ?You have increasing pain or severe headaches. ?You have nausea, vomiting, or drowsiness. ?You have swelling around your face. ?You have vision problems. ?You have a stiff neck. ?You have difficulty breathing. ?MAKE SURE YOU:  ?Understand these instructions. ?Will watch your condition. ?Will get help right away if you are not doing well or get worse. ?Document Released: 03/10/2005 Document Revised: 06/02/2011 Document Reviewed: 03/25/2011 ?ExitCare? Patient Information ?2014 [place], [place]. ? ? ? ?If you have been instructed to have an in-person evaluation today at a local Urgent Care facility, please use the link below. It will take you to a list of all of our available [facility], including address, phone number and hours of operation. Please do not delay care.  ?[facility] ? ?If you or a family member do not have a primary care provider, use the link below to schedule a visit and establish care. When you choose a [facility] primary care physician or advanced practice provider, you gain a long-term partner in health. ?Find a Primary Care Provider ? ?Learn more about [facility]'s in-office and virtual care options: ?[facility]  ?

## 2021-07-23 NOTE — Progress Notes (Signed)
?Virtual Visit Consent  ? ?Theresa Rosario, you are scheduled for a virtual visit with a [facility] provider today.   ?  ?Just as with appointments in the office, your consent must be obtained to participate.  Your consent will be active for this visit and any virtual visit you may have with one of our providers in the next 365 days.   ?  ?If you have a MyChart account, a copy of this consent can be sent to you electronically.  All virtual visits are billed to your insurance company just like a traditional visit in the office.   ? ?As this is a virtual visit, video technology does not allow for your provider to perform a traditional examination.  This may limit your provider's ability to fully assess your condition.  If your provider identifies any concerns that need to be evaluated in person or the need to arrange testing (such as labs, EKG, etc.), we will make arrangements to do so.   ?  ?Although advances in technology are sophisticated, we cannot ensure that it will always work on either your end or our end.  If the connection with a video visit is poor, the visit may have to be switched to a telephone visit.  With either a video or telephone visit, we are not always able to ensure that we have a secure connection.   ? ?Also, by engaging in this virtual visit, you consent to the provision of healthcare. Additionally, you authorize for your insurance to be billed (if applicable) for the services provided during this visit. I also discussed with the patient that there may be a patient responsible charge related to this service. ? ?I need to obtain your verbal consent now.   Are you willing to proceed with your visit today?  ?  ?[name] has provided verbal consent on 07/23/2021 for a virtual visit (video or telephone). ?  ?Piedad ClimesWilliam Cody Kian Gamarra, PA-C  ? ?Date: 07/23/2021 6:08 PM ? ? ?Virtual Visit via Video Note  ? ?[name], Theresa Rosario, connected with  Theresa RavensMontrese T Lepak  ([id number], Theresa Rosario 11, 1987)  on 07/23/21 at  6:00 PM EDT by a video-enabled telemedicine application and verified that I am speaking with the correct person using two identifiers. ? ?Location: ?Patient: Virtual Visit Location Patient: Home ?Provider: Virtual Visit Location Provider: Home Office ?  ?I discussed the limitations of evaluation and management by telemedicine and the availability of in person appointments. The patient expressed understanding and agreed to proceed.   ? ?History of Present Illness: ?Theresa Rosario is a 36 y.o. who identifies as a female who was assigned female at birth, and is being seen today for progressively worsening sore throat over the weekend. Now noting R ear pain. Ear pain is intermittent.  Initially with nasal congestion and coughing. Nasal congestion has persisted now with sinus pain but coughing improved. Denies fever, chills, aches. Denies chest pain or SOB . Notes tonsillar swelling without exudate. Denies recent travel or sick contact.  ? ?Is taking Loratadine and Tylenol OTC.  ? ?HPI: HPI  ?Problems:  ?Patient Active Problem List  ? Diagnosis Date Noted  ? Encounter for general adult medical examination with abnormal findings 04/02/2021  ? Irritant contact dermatitis 04/02/2021  ? History of nephrolithiasis 01/17/2021  ? Essential hypertension 01/24/2020  ? Prediabetes 01/04/2020  ? Insomnia 01/04/2020  ? Hyperpigmented skin lesion 07/07/2019  ? Obstructive sleep apnea of adult 06/08/2019  ? Chronic low back pain 06/08/2019  ?  GERD (gastroesophageal reflux disease) 03/31/2019  ? Family history of colon cancer in mother 02/09/2019  ? IDA (iron deficiency anemia) 12/30/2018  ? Chronic migraine 08/17/2018  ? Fibromyalgia 07/13/2012  ? Depression with anxiety 07/13/2012  ?  ?Allergies:  ?Allergies  ?Allergen Reactions  ? Amoxicillin Hives  ? Banana Itching  ?  Tongue itching  ? Motrin [Ibuprofen] Rash  ?  MOTRIN (Brand only) causes this allergy (tolerates ADVIL brand)  ? Other Itching  ?  Tree nuts  cause tongue itching ?  ? Pineapple Itching  ?  Tongue itching  ? ?Medications:  ?Current Outpatient Medications:  ?  azithromycin (ZITHROMAX) 250 MG tablet, Take 2 tablets on day 1, then 1 tablet daily on days 2 through 5, Disp: 6 tablet, Rfl: 0 ?  benzonatate (TESSALON) 100 MG capsule, Take 1 capsule (100 mg total) by mouth 3 (three) times daily as needed for cough., Disp: 30 capsule, Rfl: 0 ?  acetaminophen (TYLENOL) 500 MG tablet, Take 2 tablets (1,000 mg total) by mouth every 6 (six) hours., Disp: 30 tablet, Rfl: 0 ?  amLODipine (NORVASC) 5 MG tablet, Take 1 tablet (5 mg total) by mouth daily., Disp: 30 tablet, Rfl: 6 ?  clonazePAM (KLONOPIN) 0.5 MG tablet, Take 0.5 mg by mouth 2 (two) times daily as needed., Disp: , Rfl:  ?  cyclobenzaprine (FLEXERIL) 10 MG tablet, Take 1 tablet by mouth 2 (two) times daily as needed., Disp: , Rfl:  ?  DULoxetine (CYMBALTA) 30 MG capsule, Take 1 capsule (30 mg total) by mouth daily., Disp: 30 capsule, Rfl: 3 ?  hydrOXYzine (ATARAX) 25 MG tablet, Take 25 mg by mouth 3 (three) times daily as needed., Disp: , Rfl:  ?  Iron, Ferrous Sulfate, 325 (65 Fe) MG TABS, Take 325 mg by mouth daily., Disp: 30 tablet, Rfl: 3 ?  norethindrone (MICRONOR) 0.35 MG tablet, Take 1 tablet (0.35 mg total) by mouth daily., Disp: 28 tablet, Rfl: 12 ?  pantoprazole (PROTONIX) 40 MG tablet, Take 1 tablet (40 mg total) by mouth daily., Disp: 90 tablet, Rfl: 1 ?  traMADol (ULTRAM) 50 MG tablet, Take 100 mg by mouth every 8 (eight) hours as needed., Disp: , Rfl:  ?  triamcinolone cream (KENALOG) 0.1 %, Apply 1 application topically 2 (two) times daily., Disp: 45 g, Rfl: 0 ? ?Observations/Objective: ?Patient is well-developed, well-nourished in no acute distress.  ?Resting comfortably at home.  ?Head is normocephalic, atraumatic.  ?No labored breathing. ?Speech is clear and coherent with logical content.  ?Patient is alert and oriented at baseline. ? ?Assessment and Plan: ?1. Acute bacterial sinusitis ?-  azithromycin (ZITHROMAX) 250 MG tablet; Take 2 tablets on day 1, then 1 tablet daily on days 2 through 5  Dispense: 6 tablet; Refill: 0 ?- benzonatate (TESSALON) 100 MG capsule; Take 1 capsule (100 mg total) by mouth 3 (three) times daily as needed for cough.  Dispense: 30 capsule; Refill: 0 ? ?Rx Azithromycin.  Increase fluids.  Rest.  Saline nasal spray.  Probiotic.  Mucinex as directed.  Humidifier in bedroom. Tessalon per orders.  Call or return to clinic if symptoms are not improving. ? ? ?Follow Up Instructions: ?I discussed the assessment and treatment plan with the patient. The patient was provided an opportunity to ask questions and all were answered. The patient agreed with the plan and demonstrated an understanding of the instructions.  A copy of instructions were sent to the patient via MyChart unless otherwise noted below.  ? ?The patient  was advised to call back or seek an in-person evaluation if the symptoms worsen or if the condition fails to improve as anticipated. ? ?Time:  ?I spent 10 minutes with the patient via telehealth technology discussing the above problems/concerns.   ? ?[name], PA-C ?

## 2021-07-24 ENCOUNTER — Encounter: Payer: Self-pay | Admitting: Physician Assistant

## 2021-07-24 ENCOUNTER — Telehealth: Payer: Medicaid Other

## 2021-08-14 ENCOUNTER — Ambulatory Visit (INDEPENDENT_AMBULATORY_CARE_PROVIDER_SITE_OTHER): Payer: Medicaid Other | Admitting: Allergy & Immunology

## 2021-08-14 ENCOUNTER — Encounter: Payer: Self-pay | Admitting: Allergy & Immunology

## 2021-08-14 VITALS — BP 126/82 | HR 91 | Temp 98.5°F | Ht 61.0 in | Wt 254.2 lb

## 2021-08-14 DIAGNOSIS — J310 Chronic rhinitis: Secondary | ICD-10-CM

## 2021-08-14 DIAGNOSIS — K9049 Malabsorption due to intolerance, not elsewhere classified: Secondary | ICD-10-CM | POA: Diagnosis not present

## 2021-08-14 DIAGNOSIS — B999 Unspecified infectious disease: Secondary | ICD-10-CM | POA: Diagnosis not present

## 2021-08-14 DIAGNOSIS — L249 Irritant contact dermatitis, unspecified cause: Secondary | ICD-10-CM | POA: Diagnosis not present

## 2021-08-14 MED ORDER — CETIRIZINE HCL 10 MG PO TABS: 10.0000 mg | ORAL_TABLET | Freq: Every day | ORAL | 2 refills | Status: DC

## 2021-08-14 MED ORDER — FLUTICASONE PROPIONATE 50 MCG/ACT NA SUSP: 1.0000 | Freq: Every day | NASAL | 5 refills | Status: DC

## 2021-08-14 NOTE — Patient Instructions (Addendum)
1. Irritant contact dermatitis - Continue to avoid gabapentin and tramadol.  - We could do challenges in the office if you are interested in figuring out which one is causing your hives.   2. Chronic rhinitis - Previous testing showed: outdoor molds. - Start taking: Zyrtec (cetirizine) 10mg  tablet once daily and Flonase (fluticasone) one spray per nostril up to twice daily - You can use an extra dose of the antihistamine, if needed, for breakthrough symptoms.  - Consider nasal saline rinses 1-2 times daily to remove allergens from the nasal cavities as well as help with mucous clearance (this is especially helpful to do before the nasal sprays are given)  3. Food intolerance - Continue to keep foods in your diet since you are not allergic to anything.   4. Recurrent infections - We will obtain some screening labs to evaluate your immune system.  - Labs to evaluate the quantitative [facility]) aspects of your immune system: IgG/IgA/IgM, CBC with differential - Labs to evaluate the qualitative (HOW WELL THEY WORK) aspects of your immune system: CH50, Pneumococcal titers, Tetanus titers, Diphtheria titers - We may consider immunizations with Pneumovax and Tdap to challenge your immune system, and then obtain repeat titers in 4-6 weeks.   5. Return in about 6 months (around 02/14/2022).    Please inform 02/16/2022 of any Emergency Department visits, hospitalizations, or changes in symptoms. Call us before going to the ED for breathing or allergy symptoms since we might be able to fit you in for a sick visit. Feel free to contact us anytime with any questions, problems, or concerns.  It was a pleasure to meet you today!  Websites that have reliable patient information: 1. American Academy of Asthma, Allergy, and Immunology: www.aaaai.org 2. Food Allergy Research and Education (FARE): foodallergy.org 3. Mothers of Asthmatics: http://www.asthmacommunitynetwork.org 4. American College of Allergy,  Asthma, and Immunology: www.acaai.org   COVID-19 Vaccine Information can be found at: [name] For questions related to vaccine distribution or appointments, please email [email or web address] or call [phone].   We realize that you might be concerned about having an allergic reaction to the COVID19 vaccines. To help with that concern, WE ARE OFFERING THE COVID19 VACCINES IN OUR OFFICE! Ask the front desk for dates!     "Like" [phone] on Facebook and Instagram for our latest updates!      A healthy democracy works best when [name] participate! Make sure you are registered to vote! If you have moved or changed any of your contact information, you will need to get this updated before voting!  In some cases, you MAY be able to register to vote online: [employer]         Control of Mold Allergen   Mold and fungi can grow on a variety of surfaces provided certain temperature and moisture conditions exist.  Outdoor molds grow on plants, decaying vegetation and soil.  The major outdoor mold, Alternaria and Cladosporium, are found in very high numbers during hot and dry conditions.  Generally, a late Summer - Fall peak is seen for common outdoor fungal spores.  Rain will temporarily lower outdoor mold spore count, but counts rise rapidly when the rainy period ends.  The most important indoor molds are Aspergillus and Penicillium.  Dark, humid and poorly ventilated basements are ideal sites for mold growth.  The next most common sites of mold growth are the bathroom and the kitchen.  Outdoor (Seasonal) Mold Control  Positive outdoor molds via skin testing: Alternaria  Use  air conditioning and keep windows closed Avoid exposure to decaying vegetation. Avoid leaf raking. Avoid grain handling. Consider wearing a face mask if working in moldy areas.

## 2021-08-14 NOTE — Progress Notes (Unsigned)
FOLLOW UP  Date of Service/Encounter:  08/14/21   Assessment:    Irritant contact dermatitis   Chronic rhinitis - with testing only positive to Alternaria   Food intolerance    Fibromyalgia - followed by her PCP and Dr. Peggye Form  Plan/Recommendations:   1. Irritant contact dermatitis - Continue to avoid gabapentin and tramadol.  - We could do challenges in the office if you are interested in figuring out which one is causing your hives.   2. Chronic rhinitis - Previous testing showed: outdoor molds. - Start taking: Zyrtec (cetirizine) 10mg  tablet once daily and Flonase (fluticasone) one spray per nostril up to twice daily - You can use an extra dose of the antihistamine, if needed, for breakthrough symptoms.  - Consider nasal saline rinses 1-2 times daily to remove allergens from the nasal cavities as well as help with mucous clearance (this is especially helpful to do before the nasal sprays are given)  3. Food intolerance - Continue to keep foods in your diet since you are not allergic to anything.   4. Recurrent infections - We will obtain some screening labs to evaluate your immune system.  - Labs to evaluate the quantitative [facility]) aspects of your immune system: IgG/IgA/IgM, CBC with differential - Labs to evaluate the qualitative (HOW WELL THEY WORK) aspects of your immune system: CH50, Pneumococcal titers, Tetanus titers, Diphtheria titers - We may consider immunizations with Pneumovax and Tdap to challenge your immune system, and then obtain repeat titers in 4-6 weeks.   5. Return in about 6 months (around 02/14/2022).   Subjective:   Chalese MARIAVICTORIA NOTTINGHAM is a 36 y.o. female presenting today for follow up of  Chief Complaint  Patient presents with   Follow-up    No issues with the contact dermatitis since the last visit. With allergies her throat has been hurting, coughing, watery eyes, itchy eyes, runny nose,nasal drainage in the throat allergy  medications have been helping (Loratadine/Claritin)     Sherlie T Gioffre has a history of the following: Patient Active Problem List   Diagnosis Date Noted   Encounter for general adult medical examination with abnormal findings 04/02/2021   Irritant contact dermatitis 04/02/2021   History of nephrolithiasis 01/17/2021   Essential hypertension 01/24/2020   Prediabetes 01/04/2020   Insomnia 01/04/2020   Hyperpigmented skin lesion 07/07/2019   Obstructive sleep apnea of adult 06/08/2019   Chronic low back pain 06/08/2019   GERD (gastroesophageal reflux disease) 03/31/2019   Family history of colon cancer in mother 02/09/2019   IDA (iron deficiency anemia) 12/30/2018   Chronic migraine 08/17/2018   Fibromyalgia 07/13/2012   Depression with anxiety 07/13/2012    History obtained from: chart review and patient.  Aarti is a 36 y.o. female presenting for a follow up visit.  She was last seen in January 2023 for new patient appointment.  She had testing that was only positive to Alternaria.  I asked that she come in the next time she has this rash so we could evaluate it further.  We continue with the topical steroid and hydroxyzine.  We obtained labs to look for weird causes of rashes.  For her rhinitis, we started Zyrtec 10 mg daily and Flonase 1 spray per nostril up to twice daily.  She did testing to the most common foods and this was completely negative.  Since last visit, she has done well. She has not had any more reactions. She really was thinking that this might be related  to gabapentin or tramadol. She has been off of these since the last visit and she has not had reactions at all. She prefers to just avoid these completely.  We did talk about doing challenges in the office to see which one she tolerates.  Allergic Rhinitis Symptom History: She has been having a bad cough recently. It did improve with the antibiotics, but it is still there. She has not been doing cetirizine.   She did get one round of antibiotics during this time; she is unsure what it was but maybe azithromycin.  She has not been using any of her nose sprays.  The last time that she had antibiotics was one year ago before the azithromycin episode. She does get a lot of colds. She has never needed to be hospitalized for her symptoms at all. She tends to keep colds and states that her for a longer period  She has a 36yo and an 36yo.  Neither of them have atopic disease or recurrent infections.  Otherwise, there have been no changes to her past medical history, surgical history, family history, or social history.    Review of Systems  Constitutional: Negative.  Negative for chills, fever, malaise/fatigue and weight loss.  HENT:  Positive for congestion. Negative for ear discharge, ear pain and sinus pain.   Eyes:  Negative for pain, discharge and redness.  Respiratory:  Negative for cough, sputum production, shortness of breath and wheezing.   Cardiovascular: Negative.  Negative for chest pain and palpitations.  Gastrointestinal:  Negative for abdominal pain, constipation, diarrhea, heartburn, nausea and vomiting.  Skin:  Positive for itching and rash.  Neurological:  Negative for dizziness and headaches.  Endo/Heme/Allergies:  Positive for environmental allergies. Does not bruise/bleed easily.      Objective:   Blood pressure 126/82, pulse 91, temperature 98.5 F (36.9 C), height  (1.549 m), weight 254 lb 4 oz (115.3 kg), SpO2 96 %. Body mass index is 48.04 kg/m.    Physical Exam Vitals reviewed.  Constitutional:      Appearance: She is well-developed. She is obese.  HENT:     Head: Normocephalic and atraumatic.     Right Ear: Tympanic membrane, ear canal and external ear normal. No drainage, swelling or tenderness. Tympanic membrane is not injected, scarred, erythematous, retracted or bulging.     Left Ear: Tympanic membrane, ear canal and external ear normal. No drainage,  swelling or tenderness. Tympanic membrane is not injected, scarred, erythematous, retracted or bulging.     Nose: Rhinorrhea present. No nasal deformity, septal deviation or mucosal edema.     Right Turbinates: Enlarged, swollen and pale.     Left Turbinates: Enlarged, swollen and pale.     Right Sinus: No maxillary sinus tenderness or frontal sinus tenderness.     Left Sinus: No maxillary sinus tenderness or frontal sinus tenderness.     Comments: No nasal polyps.    Mouth/Throat:     Mouth: Mucous membranes are not pale and not dry.     Pharynx: Uvula midline.  Eyes:     General:        Right eye: No discharge.        Left eye: No discharge.     Conjunctiva/sclera: Conjunctivae normal.     Right eye: Right conjunctiva is not injected. No chemosis.    Left eye: Left conjunctiva is not injected. No chemosis.    Pupils: Pupils are equal, round, and reactive to light.  Cardiovascular:  Rate and Rhythm: Normal rate and regular rhythm.     Heart sounds: Normal heart sounds.  Pulmonary:     Effort: Pulmonary effort is normal. No tachypnea, accessory muscle usage or respiratory distress.     Breath sounds: Normal breath sounds. No wheezing, rhonchi or rales.     Comments: Moving air well in all lung fields. No increased work of breathing noted.  Chest:     Chest wall: No tenderness.  Abdominal:     Tenderness: There is no abdominal tenderness. There is no guarding or rebound.  Lymphadenopathy:     Head:     Right side of head: No submandibular, tonsillar or occipital adenopathy.     Left side of head: No submandibular, tonsillar or occipital adenopathy.     Cervical: No cervical adenopathy.  Skin:    General: Skin is warm.     Capillary Refill: Capillary refill takes less than 2 seconds.     Coloration: Skin is not pale.     Findings: No abrasion, erythema, petechiae or rash. Rash is not papular, urticarial or vesicular.  Neurological:     Mental Status: She is alert.   Psychiatric:        Behavior: Behavior is cooperative.     Diagnostic studies: none       Malachi Bonds, MD  [facility]

## 2021-08-15 ENCOUNTER — Encounter: Payer: Self-pay | Admitting: Allergy & Immunology

## 2021-08-15 MED ORDER — FLUTICASONE PROPIONATE 50 MCG/ACT NA SUSP
1.0000 | Freq: Two times a day (BID) | NASAL | 5 refills | Status: DC | PRN
Start: 2021-08-15 — End: 2022-08-22

## 2021-08-15 MED ORDER — CETIRIZINE HCL 10 MG PO TABS: 10.0000 mg | ORAL_TABLET | Freq: Every day | ORAL | 2 refills | Status: DC

## 2021-08-20 ENCOUNTER — Encounter: Payer: Self-pay | Admitting: Internal Medicine

## 2021-08-20 ENCOUNTER — Ambulatory Visit (INDEPENDENT_AMBULATORY_CARE_PROVIDER_SITE_OTHER): Payer: Medicaid Other | Admitting: Internal Medicine

## 2021-08-20 VITALS — BP 124/84 | HR 88 | Resp 18 | Ht 62.0 in | Wt 257.2 lb

## 2021-08-20 DIAGNOSIS — E1169 Type 2 diabetes mellitus with other specified complication: Secondary | ICD-10-CM

## 2021-08-20 DIAGNOSIS — I10 Essential (primary) hypertension: Secondary | ICD-10-CM | POA: Diagnosis not present

## 2021-08-20 DIAGNOSIS — M797 Fibromyalgia: Secondary | ICD-10-CM

## 2021-08-20 DIAGNOSIS — F418 Other specified anxiety disorders: Secondary | ICD-10-CM | POA: Diagnosis not present

## 2021-08-20 MED ORDER — OZEMPIC (0.25 OR 0.5 MG/DOSE) 2 MG/3ML ~~LOC~~ SOPN: PEN_INJECTOR | SUBCUTANEOUS | 0 refills | Status: DC

## 2021-08-20 NOTE — Assessment & Plan Note (Signed)
BP Readings from Last 1 Encounters:  08/20/21 124/84   Well-controlled with amlodipine 5 mg daily Counseled for compliance with the medications Advised to follow DASH diet and perform moderate exercise/walking at least 150 minutes/week

## 2021-08-20 NOTE — Assessment & Plan Note (Signed)
On Cymbalta and tramadol Takes Klonopin for anxiety Followed by neurology-Ayeshia Lowell Guitar, NP

## 2021-08-20 NOTE — Assessment & Plan Note (Signed)
Lab Results  Component Value Date   HGBA1C 6.3 (H) 01/18/2021   Well-controlled with diet so far Added Ozempic for insulin resistance and weight loss benefit Advised to follow diabetic diet F/u CMP and lipid panel Diabetic eye exam: Advised to follow up with Ophthalmology for diabetic eye exam

## 2021-08-20 NOTE — Patient Instructions (Signed)
Please start taking Ozempic as prescribed.  Please follow small, frequent meals to avoid nausea from Ozempic.  Please follow low carb diet and perform moderate exercise as advised.

## 2021-08-20 NOTE — Assessment & Plan Note (Signed)
On Cymbalta and clonazepam, managed by neurology/psychiatry 

## 2021-08-20 NOTE — Progress Notes (Signed)
Established Patient Office Visit  Subjective:  Patient ID: Theresa Rosario, female    DOB: 1985/12/24  Age: 36 y.o. MRN: [id number]  CC:  Chief Complaint  Patient presents with   Follow-up    6 month follow up discuss diabetic symptoms     HPI Theresa Rosario is a 36 y.o. female with past medical history of hypertension, type 2 DM, fibromyalgia, insomnia, OSA and morbid obesity who presents for f/u of her chronic medical conditions.  HTN: BP is well-controlled. Takes medications regularly. Patient denies headache, dizziness, chest pain, dyspnea or palpitations.  Type II DM: Her last Hgb A1c was 6.3.  She has been able to manage it with diet so far.  She has been trying to follow low-carb diet, but has not been able to lose weight with it.  She has chronic fatigue, but denies any polyuria or polyphagia currently.  She takes Cymbalta and clonazepam for depression with anxiety, managed by neurology/psychiatry.  She takes tramadol as needed for chronic low back pain and fibromyalgia.     Past Medical History:  Diagnosis Date   Abnormal chromosomal and genetic finding on antenatal screening mother 02/06/2020   On Horizon Nov 2021     Silent carrier for alpha-thalassemia (FOB____)  and increased carrier risk for SMA  (FOB____)   Anemia 12/30/2018   Blood in urine 10/18/2015   Condyloma of female genitalia 10/21/2012   Subclitoral, TCA 7/31    Depression    Diabetes mellitus without complication (HCC)    Fibromyalgia    Hx of hematuria    Hx of ovarian cyst    Hypertension    Irregular bleeding 12/02/2017   Kidney stone    History of kidney stones   Large breasts    Lumbago 11/11/2011   Migraine     Past Surgical History:  Procedure Laterality Date   DENTAL SURGERY     WISDOM TOOTH EXTRACTION Bilateral     Family History  Problem Relation Age of Onset   Diabetes Paternal Grandmother    Hypertension Paternal Grandmother    Diabetes Maternal Grandmother    Heart  disease Maternal Grandfather    Hypertension Father    Diabetes Mother    Hypertension Mother    Parkinson's disease Mother    Colon cancer Mother 11   Coronary artery disease Other    Diabetes Other     Social History   Socioeconomic History   Marital status: Significant Other    Spouse name: Not on file   Number of children: 2   Years of education: college   Highest education level: Master's degree (e.g., MA, MS, MEng, MEd, MSW, MBA)  Occupational History   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 Right-handed. 2-3 cups caffeine per day.   Social Determinants of Health   Financial  Resource Strain: Low Risk    Difficulty of Paying Living Expenses: Not very hard  Food Insecurity: No Food Insecurity   Worried About Charity fundraiser in the Last Year: Never true   Ran Out of Food in the Last Year: Never true  Transportation Needs: No Transportation Needs   Lack of Transportation (Medical): No   Lack of Transportation (Non-Medical): No  Physical Activity: Inactive   Days of Exercise per Week: 0 days   Minutes of Exercise per Session: 0 min  Stress: No Stress Concern Present   Feeling of Stress : Only a little  Social Connections: Moderately Isolated   Frequency of Communication with Friends and Family: Once a week   Frequency of Social Gatherings with Friends and Family: Never   Attends Religious Services: 1 to 4 times per year   Active  Member of [employer] or Organizations: No   Attends Music therapist: Never   Marital Status: Living with partner  Intimate Partner Violence: Not At Risk   Fear of Current or Ex-Partner: No   Emotionally Abused: No   Physically Abused: No   Sexually Abused: No    Outpatient Medications Prior to Visit  Medication Sig Dispense Refill   acetaminophen (TYLENOL) 500 MG tablet Take 2 tablets (1,000 mg total) by mouth every 6 (six) hours. 30 tablet 0   amLODipine (NORVASC) 5 MG tablet Take 1 tablet (5 mg total) by mouth daily. 30 tablet 6   benzonatate (TESSALON) 100 MG capsule Take 1 capsule (100 mg total) by mouth 3 (three) times daily as needed for cough. 30 capsule 0   cetirizine (ZYRTEC) 10 MG tablet Take 1 tablet (10 mg total) by mouth daily. 90 tablet 2   clonazePAM (KLONOPIN) 0.5 MG tablet Take 0.5 mg by mouth 2 (two) times daily as needed.     cyclobenzaprine (FLEXERIL) 10 MG tablet Take 1 tablet by mouth 2 (two) times daily as needed.     DULoxetine (CYMBALTA) 30 MG capsule Take 1 capsule (30 mg total) by mouth daily. 30 capsule 3   fluticasone (FLONASE) 50 MCG/ACT nasal spray Place 1 spray into both nostrils 2 (two) times daily as needed for allergies or rhinitis. 16 g 5   hydrOXYzine (ATARAX) 25 MG tablet Take 25 mg by mouth 3 (three) times daily as needed.     Iron, Ferrous Sulfate, 325 (65 Fe) MG TABS Take 325 mg by mouth daily. 30 tablet 3   norethindrone (MICRONOR) 0.35 MG tablet Take 1 tablet (0.35 mg total) by mouth daily. 28 tablet 12   pantoprazole (PROTONIX) 40 MG tablet Take 1 tablet (40 mg total) by mouth daily. 90 tablet 1   traMADol (ULTRAM) 50 MG tablet Take 100 mg by mouth every 8 (eight) hours as needed.     triamcinolone cream (KENALOG) 0.1 % Apply 1 application topically 2 (two) times daily. 45 g 0   No facility-administered medications prior to visit.    Allergies  Allergen Reactions   Amoxicillin Hives   Banana Itching    Tongue itching   Motrin  [Ibuprofen] Rash    MOTRIN (Brand only) causes this allergy (tolerates ADVIL brand)   Other Itching    Tree nuts cause tongue itching    Pineapple Itching    Tongue itching    ROS Review of Systems  Constitutional:  Negative for chills and fever.  HENT:  Negative for congestion, sinus pressure, sinus pain and sore throat.   Eyes:  Negative for  pain and discharge.  Respiratory:  Negative for cough and shortness of breath.   Cardiovascular:  Negative for chest pain and palpitations.  Gastrointestinal:  Negative for diarrhea, nausea and vomiting.  Endocrine: Negative for polydipsia and polyuria.  Genitourinary:  Negative for dysuria and hematuria.  Musculoskeletal:  Positive for arthralgias and back pain. Negative for neck pain and neck stiffness.  Skin:  Negative for rash.  Neurological:  Negative for dizziness, syncope, weakness and numbness.  Psychiatric/Behavioral:  Negative for agitation and behavioral problems.      Objective:    Physical Exam Vitals reviewed.  Constitutional:      General: She is not in acute distress.    Appearance: She is obese. She is not diaphoretic.  HENT:     Head: Normocephalic and atraumatic.     Nose: Nose normal.     Mouth/Throat:     Mouth: Mucous membranes are moist.  Eyes:     General: No scleral icterus.    Extraocular Movements: Extraocular movements intact.  Cardiovascular:     Rate and Rhythm: Normal rate and regular rhythm.     Pulses: Normal pulses.     Heart sounds: Normal heart sounds. No murmur heard. Pulmonary:     Breath sounds: Normal breath sounds. No wheezing or rales.  Abdominal:     Palpations: Abdomen is soft.     Tenderness: There is no abdominal tenderness. There is no right CVA tenderness or left CVA tenderness.  Musculoskeletal:     Cervical back: Neck supple. No tenderness.     Right lower leg: No edema.     Left lower leg: No edema.  Skin:    General: Skin is warm.     Findings: No rash.  Neurological:      General: No focal deficit present.     Mental Status: She is alert and oriented to person, place, and time.  Psychiatric:        Mood and Affect: Mood normal.        Behavior: Behavior normal.    BP 124/84 (BP Location: Right Arm, Patient Position: Sitting, Cuff Size: Normal)   Pulse 88   Resp 18   Ht 5' 2"  (1.575 m)   Wt 257 lb 3.2 oz (116.7 kg)   SpO2 98%   BMI 47.04 kg/m  Wt Readings from Last 3 Encounters:  08/20/21 257 lb 3.2 oz (116.7 kg)  08/14/21 254 lb 4 oz (115.3 kg)  05/10/21 252 lb 12.8 oz (114.7 kg)    Lab Results  Component Value Date   TSH 1.950 06/30/2019   Lab Results  Component Value Date   WBC 8.7 05/17/2021   HGB 13.8 05/17/2021   HCT 44.0 05/17/2021   MCV 77 (L) 05/17/2021   PLT 326 01/18/2021   Lab Results  Component Value Date   NA 140 05/17/2021   K 4.2 05/17/2021   CO2 22 05/17/2021   GLUCOSE 79 05/17/2021   BUN 7 05/17/2021   CREATININE 0.76 05/17/2021   BILITOT <0.2 05/17/2021   ALKPHOS 103 05/17/2021   AST 13 05/17/2021   ALT 17 05/17/2021   PROT 7.2 05/17/2021   ALBUMIN 4.5 05/17/2021   CALCIUM 8.8 05/17/2021   ANIONGAP 9 07/22/2020   EGFR 105 05/17/2021   Lab Results  Component Value Date   CHOL 136 01/18/2021   Lab Results  Component Value Date   HDL 35 (L) 01/18/2021   Lab Results  Component Value Date   LDLCALC 88 01/18/2021  Lab Results  Component Value Date   TRIG 65 01/18/2021   Lab Results  Component Value Date   CHOLHDL 3.9 01/18/2021   Lab Results  Component Value Date   HGBA1C 6.3 (H) 01/18/2021      Assessment & Plan:   Problem List Items Addressed This Visit       Cardiovascular and Mediastinum   Essential hypertension - Primary    BP Readings from Last 1 Encounters:  08/20/21 124/84  Well-controlled with amlodipine 5 mg daily Counseled for compliance with the medications Advised to follow DASH diet and perform moderate exercise/walking at least 150 minutes/week        Endocrine    Type 2 diabetes mellitus with other specified complication ([place])    Lab Results  Component Value Date   HGBA1C 6.3 (H) 01/18/2021  Well-controlled with diet so far Added Ozempic for insulin resistance and weight loss benefit Advised to follow diabetic diet F/u CMP and lipid panel Diabetic eye exam: Advised to follow up with Ophthalmology for diabetic eye exam      Relevant Medications   Semaglutide,0.25 or 0.5MG/DOS, (OZEMPIC, 0.25 OR 0.5 MG/DOSE,) 2 MG/3ML SOPN   Other Relevant Orders   CMP14+EGFR   Hemoglobin A1c     Other   Fibromyalgia    On Cymbalta and tramadol Takes Klonopin for anxiety Followed by neurology-Ayeshia Florene Glen, NP       Depression with anxiety    On Cymbalta and clonazepam, managed by neurology/psychiatry        Meds ordered this encounter  Medications   Semaglutide,0.25 or 0.5MG/DOS, (OZEMPIC, 0.25 OR 0.5 MG/DOSE,) 2 MG/3ML SOPN    Sig: Inject 0.25 mg into the skin every 7 (seven) days for 28 days, THEN 0.5 mg every 7 (seven) days.    Dispense:  6 mL    Refill:  0    Follow-up: Return in about 10 weeks (around 10/29/2021) for DM and HTN.    Lindell Spar, MD

## 2021-09-30 ENCOUNTER — Ambulatory Visit: Payer: Medicaid Other | Admitting: Internal Medicine

## 2021-10-22 DIAGNOSIS — E1169 Type 2 diabetes mellitus with other specified complication: Secondary | ICD-10-CM | POA: Diagnosis not present

## 2021-10-22 DIAGNOSIS — B999 Unspecified infectious disease: Secondary | ICD-10-CM | POA: Diagnosis not present

## 2021-10-23 LAB — CMP14+EGFR
ALT: 17 IU/L (ref 0–32)
AST: 14 IU/L (ref 0–40)
Albumin/Globulin Ratio: 1.4 (ref 1.2–2.2)
Albumin: 4.5 g/dL (ref 3.9–4.9)
Alkaline Phosphatase: 113 IU/L (ref 44–121)
BUN/Creatinine Ratio: 13 (ref 9–23)
BUN: 9 mg/dL (ref 6–20)
Bilirubin Total: <0.2 mg/dL (ref 0.0–1.2)
CO2: 23 mmol/L (ref 20–29)
Calcium: 9.4 mg/dL (ref 8.7–10.2)
Chloride: 102 mmol/L (ref 96–106)
Creatinine, Ser: 0.69 mg/dL (ref 0.57–1.00)
Globulin, Total: 3.2 g/dL (ref 1.5–4.5)
Glucose: 94 mg/dL (ref 70–99)
Potassium: 4.2 mmol/L (ref 3.5–5.2)
Sodium: 139 mmol/L (ref 134–144)
Total Protein: 7.7 g/dL (ref 6.0–8.5)
eGFR: 116 mL/min/{1.73_m2} (ref >59)

## 2021-10-23 LAB — HEMOGLOBIN A1C
Est. average glucose Bld gHb Est-mCnc: 126 mg/dL
Hgb A1c MFr Bld: 6 % — ABNORMAL HIGH (ref 4.8–5.6)

## 2021-10-29 ENCOUNTER — Ambulatory Visit: Payer: Medicaid Other | Admitting: Internal Medicine

## 2021-10-29 ENCOUNTER — Encounter: Payer: Self-pay | Admitting: Internal Medicine

## 2021-10-29 VITALS — BP 124/82 | HR 91 | Resp 18 | Ht 61.0 in | Wt 240.2 lb

## 2021-10-29 DIAGNOSIS — J3089 Other allergic rhinitis: Secondary | ICD-10-CM | POA: Diagnosis not present

## 2021-10-29 DIAGNOSIS — E1169 Type 2 diabetes mellitus with other specified complication: Secondary | ICD-10-CM | POA: Diagnosis not present

## 2021-10-29 DIAGNOSIS — J309 Allergic rhinitis, unspecified: Secondary | ICD-10-CM | POA: Insufficient documentation

## 2021-10-29 DIAGNOSIS — I10 Essential (primary) hypertension: Secondary | ICD-10-CM | POA: Diagnosis not present

## 2021-10-29 DIAGNOSIS — K219 Gastro-esophageal reflux disease without esophagitis: Secondary | ICD-10-CM | POA: Diagnosis not present

## 2021-10-29 LAB — STREP PNEUMONIAE 23 SEROTYPES IGG
Pneumo Ab Type 1*: 0.3 ug/mL — ABNORMAL LOW (ref >1.3)
Pneumo Ab Type 12 (12F)*: 0.3 ug/mL — ABNORMAL LOW (ref >1.3)
Pneumo Ab Type 14*: 0.6 ug/mL — ABNORMAL LOW (ref >1.3)
Pneumo Ab Type 17 (17F)*: 0.8 ug/mL — ABNORMAL LOW (ref >1.3)
Pneumo Ab Type 19 (19F)*: 3.6 ug/mL (ref >1.3)
Pneumo Ab Type 2*: 1 ug/mL — ABNORMAL LOW (ref >1.3)
Pneumo Ab Type 20*: 3.4 ug/mL (ref >1.3)
Pneumo Ab Type 22 (22F)*: 0.7 ug/mL — ABNORMAL LOW (ref >1.3)
Pneumo Ab Type 23 (23F)*: 0.2 ug/mL — ABNORMAL LOW (ref >1.3)
Pneumo Ab Type 26 (6B)*: 0.4 ug/mL — ABNORMAL LOW (ref >1.3)
Pneumo Ab Type 3*: 0.5 ug/mL — ABNORMAL LOW (ref >1.3)
Pneumo Ab Type 34 (10A)*: 2.3 ug/mL (ref >1.3)
Pneumo Ab Type 4*: 0.1 ug/mL — ABNORMAL LOW (ref >1.3)
Pneumo Ab Type 43 (11A)*: 1.8 ug/mL (ref >1.3)
Pneumo Ab Type 5*: <0.1 ug/mL — ABNORMAL LOW (ref >1.3)
Pneumo Ab Type 51 (7F)*: 1 ug/mL — ABNORMAL LOW (ref >1.3)
Pneumo Ab Type 54 (15B)*: 0.2 ug/mL — ABNORMAL LOW (ref >1.3)
Pneumo Ab Type 56 (18C)*: 0.6 ug/mL — ABNORMAL LOW (ref >1.3)
Pneumo Ab Type 57 (19A)*: 2.6 ug/mL (ref >1.3)
Pneumo Ab Type 68 (9V)*: 0.2 ug/mL — ABNORMAL LOW (ref >1.3)
Pneumo Ab Type 70 (33F)*: 2.1 ug/mL (ref >1.3)
Pneumo Ab Type 8*: 0.8 ug/mL — ABNORMAL LOW (ref >1.3)
Pneumo Ab Type 9 (9N)*: 0.3 ug/mL — ABNORMAL LOW (ref >1.3)

## 2021-10-29 LAB — CBC WITH DIFFERENTIAL
Basophils Absolute: 0 10*3/uL (ref 0.0–0.2)
Basos: 1 %
EOS (ABSOLUTE): 0.2 10*3/uL (ref 0.0–0.4)
Eos: 2 %
Hematocrit: 44.9 % (ref 34.0–46.6)
Hemoglobin: 14.1 g/dL (ref 11.1–15.9)
Immature Grans (Abs): 0 10*3/uL (ref 0.0–0.1)
Immature Granulocytes: 0 %
Lymphocytes Absolute: 3.1 10*3/uL (ref 0.7–3.1)
Lymphs: 38 %
MCH: 23.3 pg — ABNORMAL LOW (ref 26.6–33.0)
MCHC: 31.4 g/dL — ABNORMAL LOW (ref 31.5–35.7)
MCV: 74 fL — ABNORMAL LOW (ref 79–97)
Monocytes Absolute: 0.5 10*3/uL (ref 0.1–0.9)
Monocytes: 6 %
Neutrophils Absolute: 4.4 10*3/uL (ref 1.4–7.0)
Neutrophils: 53 %
RBC: 6.06 x10E6/uL — ABNORMAL HIGH (ref 3.77–5.28)
RDW: 14.4 % (ref 11.7–15.4)
WBC: 8.3 10*3/uL (ref 3.4–10.8)

## 2021-10-29 LAB — IGG, IGA, IGM
IgA/Immunoglobulin A, Serum: 258 mg/dL (ref 87–352)
IgG (Immunoglobin G), Serum: 1688 mg/dL — ABNORMAL HIGH (ref 586–1602)
IgM (Immunoglobulin M), Srm: 107 mg/dL (ref 26–217)

## 2021-10-29 LAB — COMPLEMENT, TOTAL: Compl, Total (CH50): >60 U/mL (ref >41)

## 2021-10-29 LAB — DIPHTHERIA / TETANUS ANTIBODY PANEL
Diphtheria Ab: 0.21 IU/mL (ref <0.10)
Tetanus Ab, IgG: 2.01 IU/mL (ref <0.10)

## 2021-10-29 MED ORDER — SEMAGLUTIDE (1 MG/DOSE) 4 MG/3ML ~~LOC~~ SOPN: 1.0000 mg | PEN_INJECTOR | SUBCUTANEOUS | 0 refills | Status: DC

## 2021-10-29 NOTE — Assessment & Plan Note (Signed)
BP Readings from Last 1 Encounters:  10/29/21 124/82   Well-controlled with amlodipine 5 mg daily Counseled for compliance with the medications Advised to follow DASH diet and perform moderate exercise/walking at least 150 minutes/week

## 2021-10-29 NOTE — Progress Notes (Signed)
Established Patient Office Visit  Subjective:  Patient ID: Theresa Rosario, female    DOB: [birth year]  Age: 36 y.o. MRN: [id number]  CC:  Chief Complaint  Patient presents with   Follow-up    10 week follow up HTN and DM patient went to allergist just allergic to mold but she still has coughing fit and itchy watery eyes     HPI Theresa Rosario is a 36 y.o. female with past medical history of hypertension, type 2 DM, fibromyalgia, insomnia, OSA and morbid obesity who presents for f/u of her chronic medical conditions.  HTN: BP is well-controlled. Takes medications regularly. Patient denies headache, dizziness, chest pain, dyspnea or palpitations.   Type II DM: Her HbA1C is 6.0 now.  She has started taking Ozempic and has been tolerating it well.  She has lost about 17 lbs since starting Ozempic.  She has been trying to follow low-carb diet, but has not been able to lose weight with it.  She has chronic fatigue, but denies any polyuria or polyphagia currently.   She complains of chronic nasal congestion, postnasal drip and itchiness of her eyes.  She has seen allergy specialist and was started on Zyrtec.  She still complains of cough, which is dry.  Denies any dyspnea or wheezing currently.  Past Medical History:  Diagnosis Date   Abnormal chromosomal and genetic finding on antenatal screening mother 02/06/2020   On [place] Nov 2021     Silent carrier for alpha-thalassemia (FOB____)  and increased carrier risk for SMA  (FOB____)   Anemia 12/30/2018   Blood in urine 10/18/2015   Condyloma of female genitalia 10/21/2012   Subclitoral, TCA 7/31    Depression    Diabetes mellitus without complication (HCC)    Fibromyalgia    Hx of hematuria    Hx of ovarian cyst    Hypertension    Irregular bleeding 12/02/2017   Kidney stone    History of kidney stones   Large breasts    Lumbago 11/11/2011   Migraine     Past Surgical History:  Procedure Laterality Date   DENTAL SURGERY      WISDOM TOOTH EXTRACTION Bilateral     Family History  Problem Relation Age of Onset   Diabetes Paternal Grandmother    Hypertension Paternal Grandmother    Diabetes Maternal Grandmother    Heart disease Maternal Grandfather    Hypertension Father    Diabetes Mother    Hypertension Mother    Parkinson's disease Mother    Colon cancer Mother 62   Coronary artery disease Other    Diabetes Other     Social History   Socioeconomic History   Marital status: Significant Other    Spouse name: Not on file   Number of children: 2   Years of education: college   Highest education level: Master's degree (e.g., MA, MS, MEng, MEd, MSW, MBA)  Occupational History   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 Right-handed. 2-3 cups caffeine per day.   Social Determinants of Health   Financial Resource Strain: Low Risk  (08/29/2020)   Overall Financial Resource Strain (CARDIA)    Difficulty of Paying Living Expenses: Not very hard  Food Insecurity: No Food Insecurity (08/29/2020)   Hunger Vital Sign    Worried About Running Out of Food in the Last Year: Never true    Ran Out of Food in the Last Year: Never true  Transportation Needs: No Transportation Needs (08/29/2020)   PRAPARE - Hydrologist (Medical): No    Lack of Transportation (Non-Medical): No  Physical Activity: Inactive (08/29/2020)   Exercise Vital Sign    Days of  Exercise per Week: 0 days    Minutes of Exercise per Session: 0 min  Stress: No Stress Concern Present (08/29/2020)   [place]    Feeling of Stress : Only a little  Social Connections: Moderately Isolated (08/29/2020)   Social Connection and Isolation Panel [NHANES]    Frequency of Communication with Friends and Family: Once a week    Frequency of Social Gatherings with Friends and Family: Never    Attends Religious Services: 1 to 4 times per year    Active Member of [employer] or Organizations: No    Attends Archivist Meetings: Never    Marital Status: Living with partner  Intimate Partner Violence: Not At Risk (08/29/2020)   Humiliation, Afraid, Rape, and Kick questionnaire    Fear of Current or Ex-Partner: No    Emotionally Abused: No    Physically Abused: No    Sexually Abused: No    Outpatient Medications Prior to Visit  Medication Sig Dispense Refill   acetaminophen (TYLENOL) 500 MG tablet Take 2 tablets (1,000 mg total) by mouth every 6 (six) hours. 30 tablet 0   amLODipine (NORVASC) 5 MG tablet Take 1 tablet (5 mg total) by mouth daily. 30 tablet 6   benzonatate (TESSALON) 100 MG capsule Take 1 capsule (100 mg total) by mouth 3 (three) times daily as needed for cough. 30 capsule 0   cetirizine (ZYRTEC) 10 MG tablet Take 1 tablet (10 mg total) by mouth daily. 90 tablet 2   clonazePAM (KLONOPIN) 0.5 MG tablet Take 0.5 mg by mouth 2 (two) times daily as needed.     cyclobenzaprine (FLEXERIL) 10 MG tablet Take 1 tablet by mouth 2 (two) times daily as needed.     DULoxetine (CYMBALTA) 30 MG capsule Take 1 capsule (30 mg total) by mouth daily. 30 capsule 3   fluticasone (FLONASE) 50 MCG/ACT nasal spray Place 1 spray into both nostrils 2 (two) times daily as needed for allergies or rhinitis. 16 g 5   hydrOXYzine (ATARAX) 25 MG tablet Take 25 mg by mouth 3 (three) times daily as needed.     Iron, Ferrous Sulfate, 325  (65 Fe) MG TABS Take 325 mg by mouth daily. 30 tablet 3   norethindrone (MICRONOR) 0.35 MG tablet Take 1 tablet (0.35 mg total) by mouth daily. 28 tablet 12   pantoprazole (PROTONIX) 40 MG tablet Take 1 tablet (40 mg total) by mouth daily. 90 tablet 1   traMADol (ULTRAM) 50 MG tablet Take 100 mg by mouth every 8 (eight) hours as needed.     triamcinolone cream (  KENALOG) 0.1 % Apply 1 application topically 2 (two) times daily. 45 g 0   Semaglutide,0.25 or 0.5MG/DOS, (OZEMPIC, 0.25 OR 0.5 MG/DOSE,) 2 MG/3ML SOPN Inject 0.25 mg into the skin every 7 (seven) days for 28 days, THEN 0.5 mg every 7 (seven) days. 6 mL 0   No facility-administered medications prior to visit.    Allergies  Allergen Reactions   Amoxicillin Hives   Banana Itching    Tongue itching   Motrin [Ibuprofen] Rash    MOTRIN (Brand only) causes this allergy (tolerates ADVIL brand)   Other Itching    Tree nuts cause tongue itching    Pineapple Itching    Tongue itching    ROS Review of Systems  Constitutional:  Negative for chills and fever.  HENT:  Positive for congestion. Negative for sinus pressure, sinus pain and sore throat.   Eyes:  Positive for itching. Negative for pain and discharge.  Respiratory:  Positive for cough. Negative for shortness of breath.   Cardiovascular:  Negative for chest pain and palpitations.  Gastrointestinal:  Negative for diarrhea, nausea and vomiting.  Endocrine: Negative for polydipsia and polyuria.  Genitourinary:  Negative for dysuria and hematuria.  Musculoskeletal:  Positive for arthralgias and back pain. Negative for neck pain and neck stiffness.  Skin:  Negative for rash.  Neurological:  Negative for dizziness, syncope, weakness and numbness.  Psychiatric/Behavioral:  Negative for agitation and behavioral problems.       Objective:    Physical Exam Vitals reviewed.  Constitutional:      General: She is not in acute distress.    Appearance: She is obese. She is not  diaphoretic.  HENT:     Head: Normocephalic and atraumatic.     Nose: Nose normal.     Mouth/Throat:     Mouth: Mucous membranes are moist.  Eyes:     General: No scleral icterus.    Extraocular Movements: Extraocular movements intact.  Cardiovascular:     Rate and Rhythm: Normal rate and regular rhythm.     Pulses: Normal pulses.     Heart sounds: Normal heart sounds. No murmur heard. Pulmonary:     Breath sounds: Normal breath sounds. No wheezing or rales.  Abdominal:     Palpations: Abdomen is soft.     Tenderness: There is no abdominal tenderness. There is no right CVA tenderness or left CVA tenderness.  Musculoskeletal:     Cervical back: Neck supple. No tenderness.     Right lower leg: No edema.     Left lower leg: No edema.  Skin:    General: Skin is warm.     Findings: No rash.  Neurological:     General: No focal deficit present.     Mental Status: She is alert and oriented to person, place, and time.  Psychiatric:        Mood and Affect: Mood normal.        Behavior: Behavior normal.     BP 124/82 (BP Location: Right Arm, Patient Position: Sitting, Cuff Size: Normal)   Pulse 91   Resp 18   Ht _0  (1.549 m)   Wt 240 lb 3.2 oz (109 kg)   SpO2 100%   BMI 45.39 kg/m  Wt Readings from Last 3 Encounters:  10/29/21 240 lb 3.2 oz (109 kg)  08/20/21 257 lb 3.2 oz (116.7 kg)  08/14/21 254 lb 4 oz (115.3 kg)    Lab Results  Component Value Date   TSH  1.950 06/30/2019   Lab Results  Component Value Date   WBC 8.3 10/22/2021   HGB 14.1 10/22/2021   HCT 44.9 10/22/2021   MCV 74 (L) 10/22/2021   PLT 326 01/18/2021   Lab Results  Component Value Date   NA 139 10/22/2021   K 4.2 10/22/2021   CO2 23 10/22/2021   GLUCOSE 94 10/22/2021   BUN 9 10/22/2021   CREATININE 0.69 10/22/2021   BILITOT <0.2 10/22/2021   ALKPHOS 113 10/22/2021   AST 14 10/22/2021   ALT 17 10/22/2021   PROT 7.7 10/22/2021   ALBUMIN 4.5 10/22/2021   CALCIUM 9.4 10/22/2021    ANIONGAP 9 07/22/2020   EGFR 116 10/22/2021   Lab Results  Component Value Date   CHOL 136 01/18/2021   Lab Results  Component Value Date   HDL 35 (L) 01/18/2021   Lab Results  Component Value Date   LDLCALC 88 01/18/2021   Lab Results  Component Value Date   TRIG 65 01/18/2021   Lab Results  Component Value Date   CHOLHDL 3.9 01/18/2021   Lab Results  Component Value Date   HGBA1C 6.0 (H) 10/22/2021      Assessment & Plan:   Problem List Items Addressed This Visit       Cardiovascular and Mediastinum   Essential hypertension    BP Readings from Last 1 Encounters:  10/29/21 124/82  Well-controlled with amlodipine 5 mg daily Counseled for compliance with the medications Advised to follow DASH diet and perform moderate exercise/walking at least 150 minutes/week        Respiratory   Allergic rhinitis    On Zyrtec currently Uses Flonase Eye itchiness likely due to allergies as well        Digestive   GERD (gastroesophageal reflux disease)    Takes pantoprazole as needed Cough could be due to GERD        Endocrine   Type 2 diabetes mellitus with other specified complication ([place]) - Primary    Lab Results  Component Value Date   HGBA1C 6.0 (H) 10/22/2021  Well-controlled Associated with HTN and OSA Had added Ozempic for insulin resistance and weight loss benefit -  Increased dose to 1 mg qw today Advised to follow diabetic diet F/u CMP and lipid panel Diabetic foot exam: Today Diabetic eye exam: Advised to follow up with Ophthalmology for diabetic eye exam      Relevant Medications   Semaglutide, 1 MG/DOSE, 4 MG/3ML SOPN   Other Relevant Orders   Microalbumin / creatinine urine ratio     Other   Morbid obesity ([place])    Diet modification and moderate exercise advised On Ozempic for type II DM Has lost 17 lbs since starting Ozempic      Relevant Medications   Semaglutide, 1 MG/DOSE, 4 MG/3ML SOPN    Meds ordered this encounter   Medications   Semaglutide, 1 MG/DOSE, 4 MG/3ML SOPN    Sig: Inject 1 mg as directed once a week.    Dispense:  9 mL    Refill:  0    Follow-up: Return in about 3 months (around 01/29/2022) for DM and weight management.    Lindell Spar, MD

## 2021-10-29 NOTE — Assessment & Plan Note (Signed)
Takes pantoprazole as needed Cough could be due to GERD

## 2021-10-29 NOTE — Assessment & Plan Note (Signed)
On Zyrtec currently Uses Flonase Eye itchiness likely due to allergies as well

## 2021-10-29 NOTE — Assessment & Plan Note (Signed)
Diet modification and moderate exercise advised On Ozempic for type II DM Has lost 17 lbs since starting Ozempic

## 2021-10-29 NOTE — Patient Instructions (Addendum)
Please start taking Ozempic 1 mg instead of 0.5 mg dose.  Please continue to follow low carb diet and perform moderate exercise/walking at least 150 mins/week.

## 2021-10-29 NOTE — Assessment & Plan Note (Addendum)
Lab Results  Component Value Date   HGBA1C 6.0 (H) 10/22/2021   Well-controlled Associated with HTN and OSA Had added Ozempic for insulin resistance and weight loss benefit -  Increased dose to 1 mg qw today Advised to follow diabetic diet F/u CMP and lipid panel Diabetic foot exam: Today Diabetic eye exam: Advised to follow up with Ophthalmology for diabetic eye exam

## 2021-10-31 LAB — MICROALBUMIN / CREATININE URINE RATIO
Creatinine, Urine: 158 mg/dL
Microalb/Creat Ratio: 7 mg/g creat (ref 0–29)
Microalbumin, Urine: 10.5 ug/mL

## 2021-11-24 ENCOUNTER — Other Ambulatory Visit: Payer: Self-pay | Admitting: Adult Health

## 2021-11-27 IMAGING — DX DG LUMBAR SPINE COMPLETE 4+V
5 series · 5 of 5 positions shown · non-contrast
Comparison: Cervical spine and lumbar spine x-rays 02/01/2013.

CLINICAL DATA: Pain.

EXAM:
LUMBAR SPINE - COMPLETE 4+ VIEW; CERVICAL SPINE - COMPLETE 4+ VIEW;
THORACIC SPINE - 3 VIEWS

[l-spine ap]
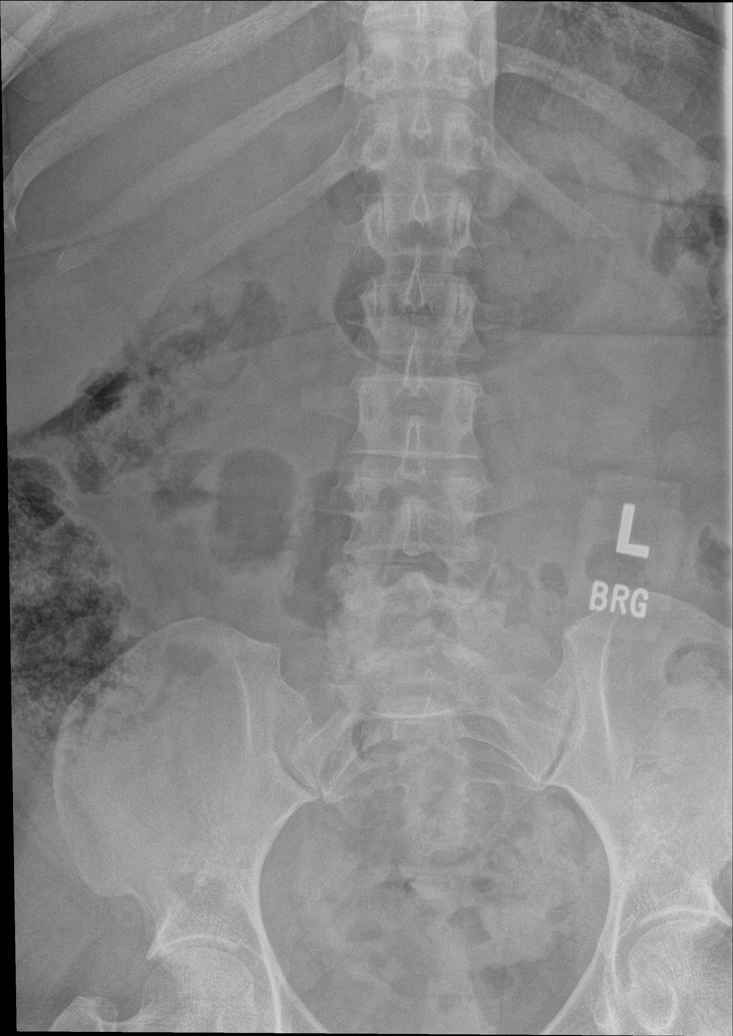

[l-spine obl (1 of 2)]
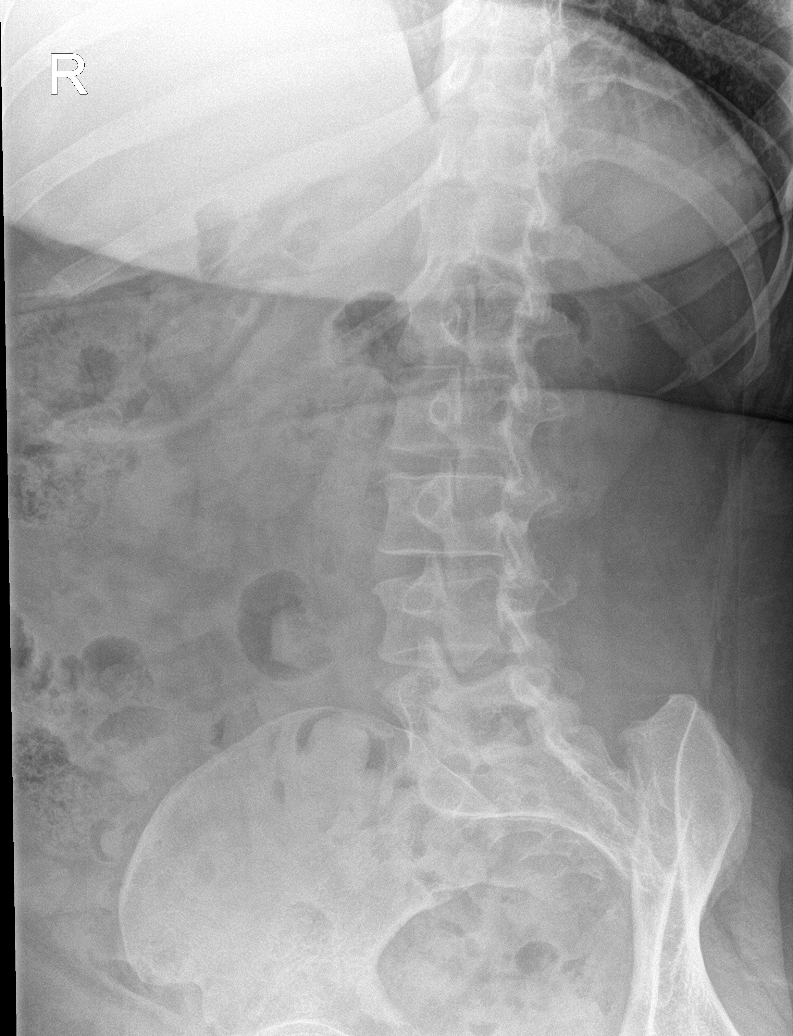

[l-spine obl (2 of 2)]
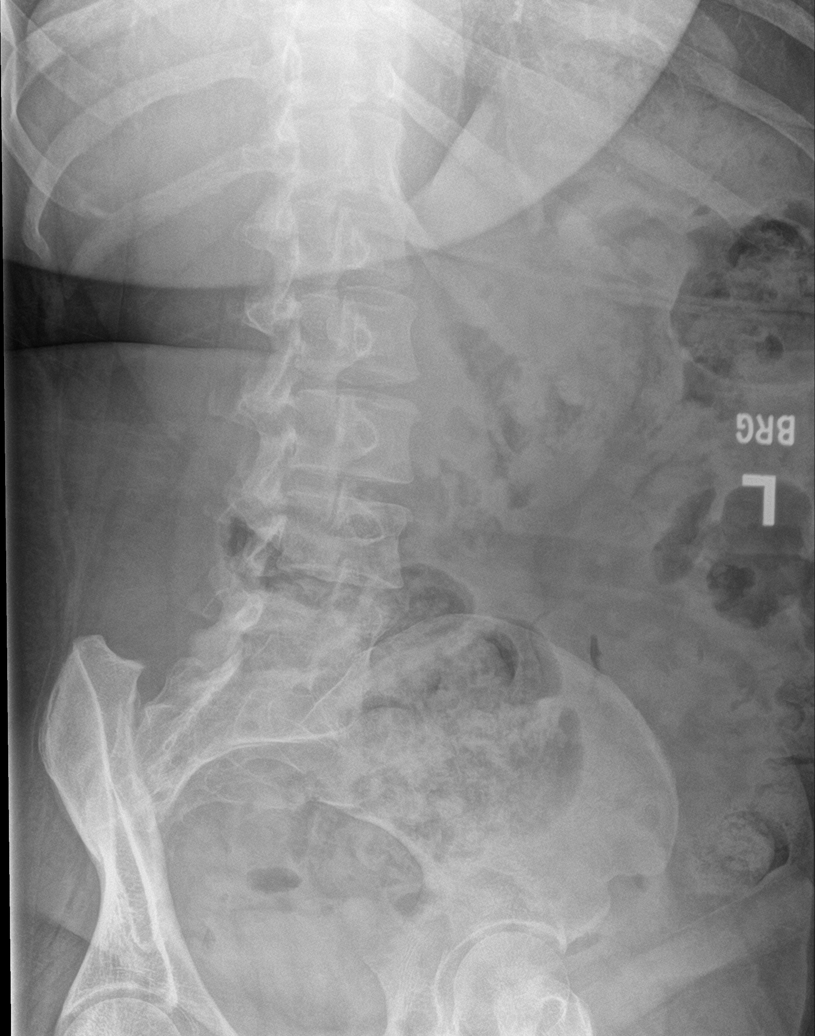

[l-spine lat]
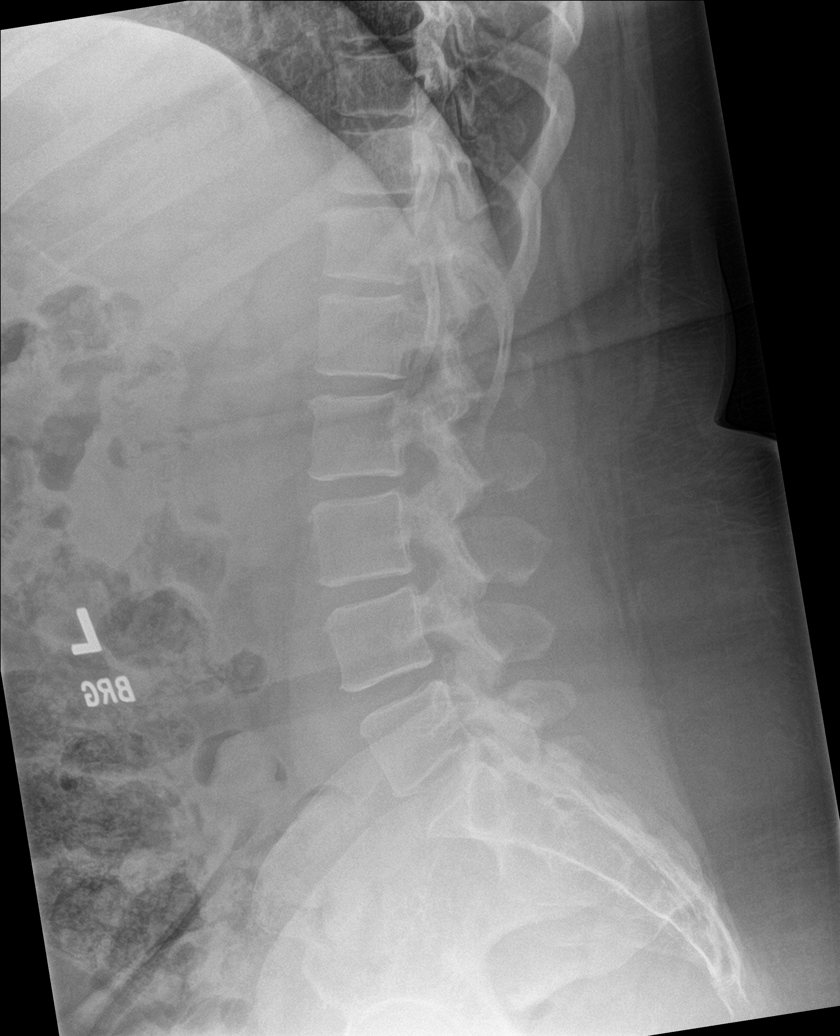

[l-spine spot]
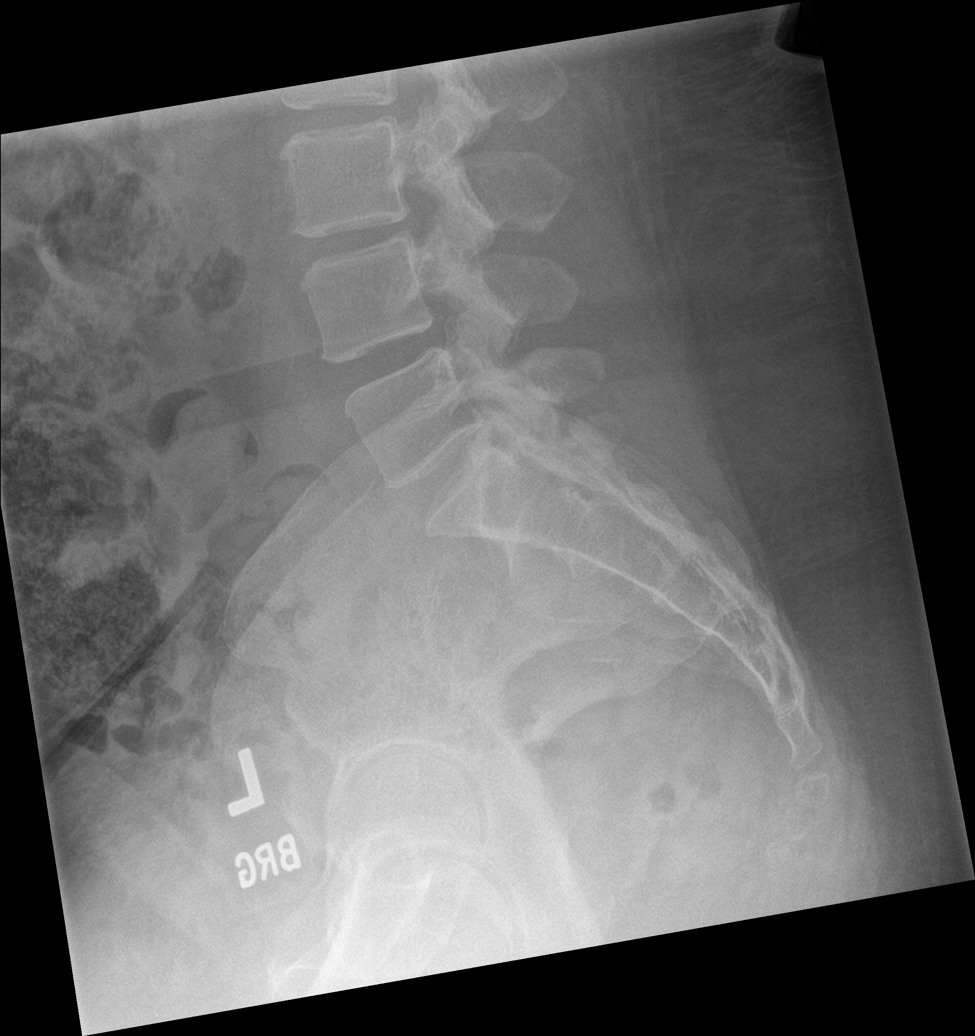

[5 of 5 positions shown; findings below may reference images not displayed]

FINDINGS: Cervical spine: Spinal alignment is within normal limits. There is
no evidence for fracture. Disc spaces are well maintained. There is
no prevertebral soft tissue swelling. No focal osseous lesion
identified. Neural foramina appear patent.

Thoracic spine: Spinal alignment is within normal limits. There is
no evidence for fracture. Disc spaces are maintained. No focal
osseous lesions are identified. Soft tissues are within normal
limits.

Lumbar Spine: Spinal alignment is within normal limits. There is no
evidence for fracture. Disc spaces are maintained. No focal osseous
lesions are identified. Soft tissues are within normal limits.
IMPRESSION: 1. Normal cervical, thoracic and lumbar spine.

## 2021-11-27 IMAGING — DX DG CERVICAL SPINE COMPLETE 4+V
6 series · 6 of 6 positions shown · non-contrast
Comparison: Cervical spine and lumbar spine x-rays 02/01/2013.

CLINICAL DATA: Pain.

EXAM:
LUMBAR SPINE - COMPLETE 4+ VIEW; CERVICAL SPINE - COMPLETE 4+ VIEW;
THORACIC SPINE - 3 VIEWS

[c-spine lat]
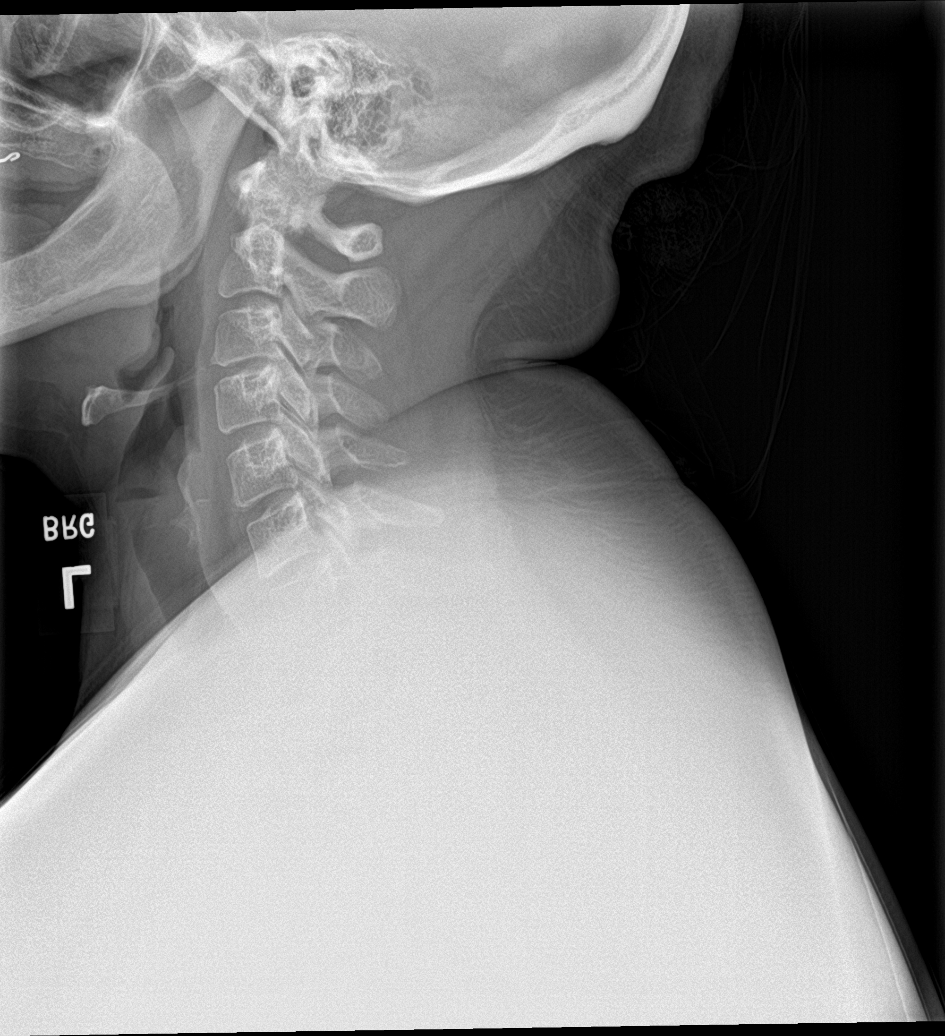

[c-spine obl (1 of 2)]
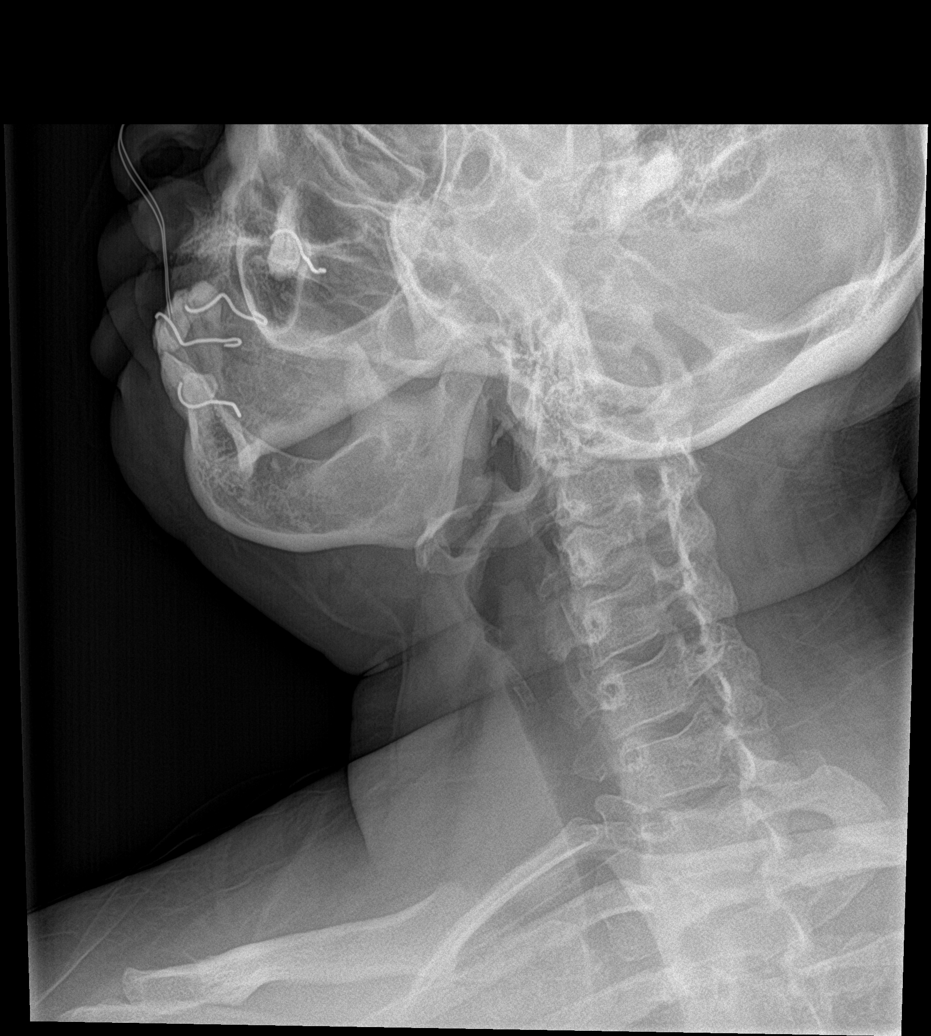

[c-spine obl (2 of 2)]
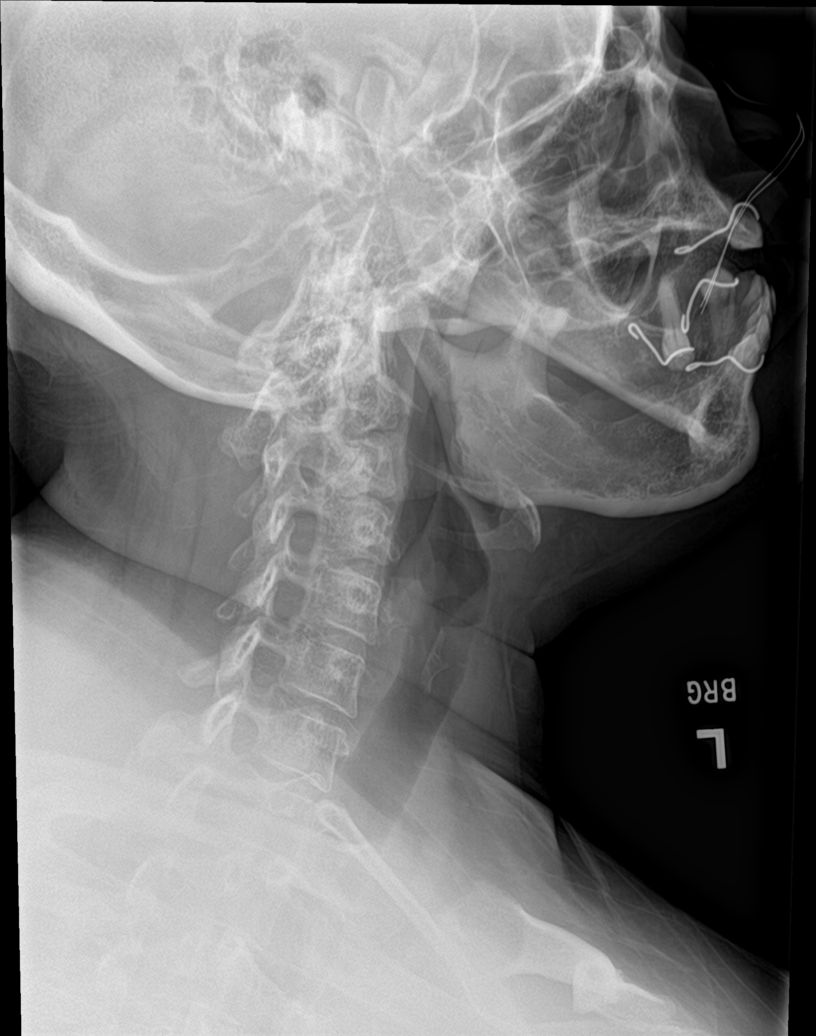

[c-spine ap]
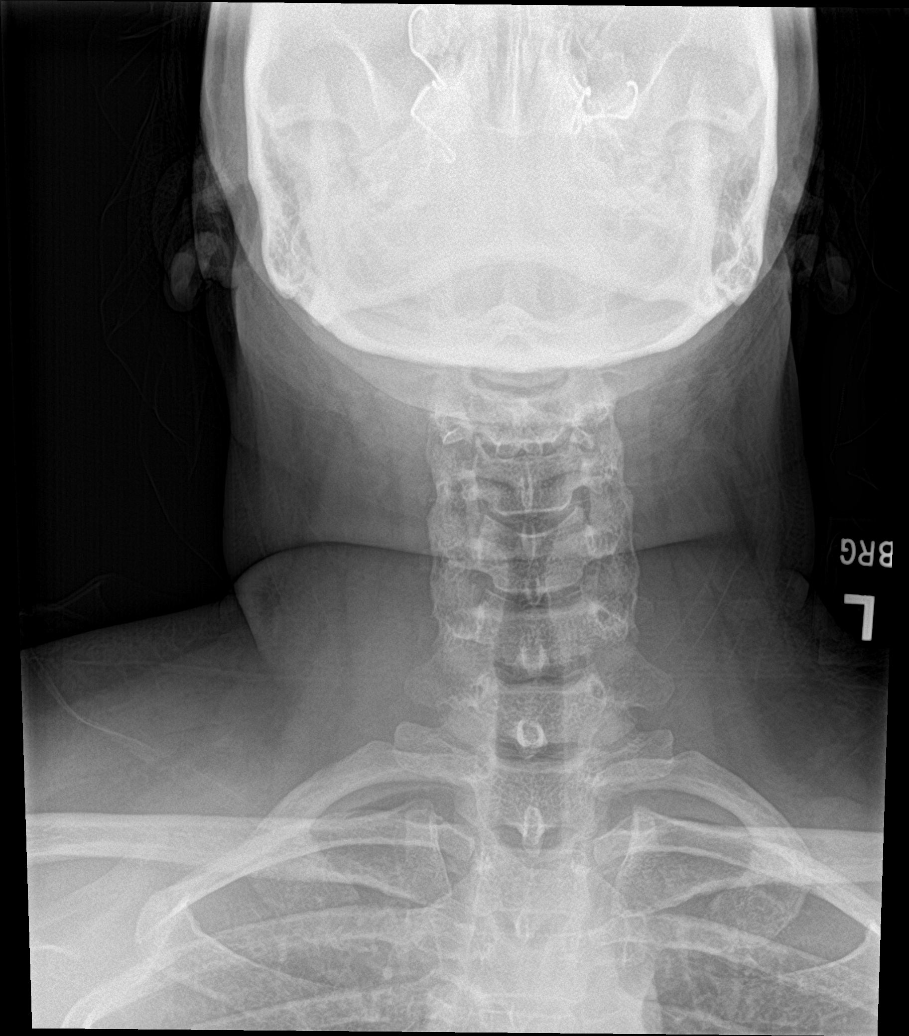

[c-spine open mouth]
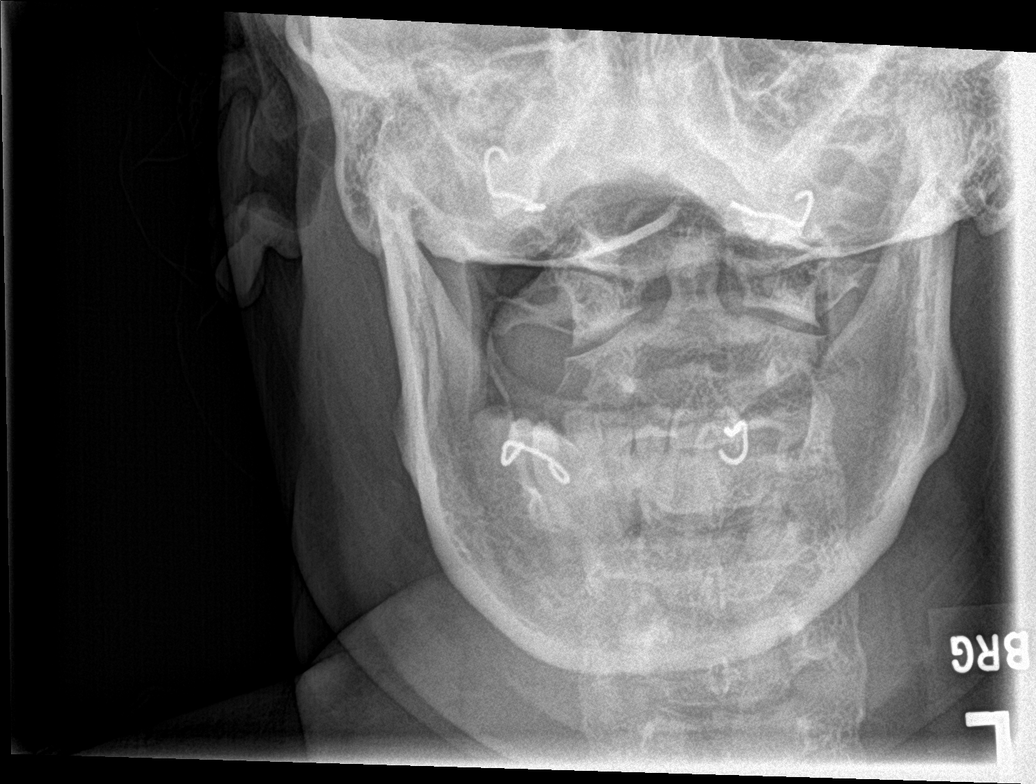

[c-spine swimmers trauma]
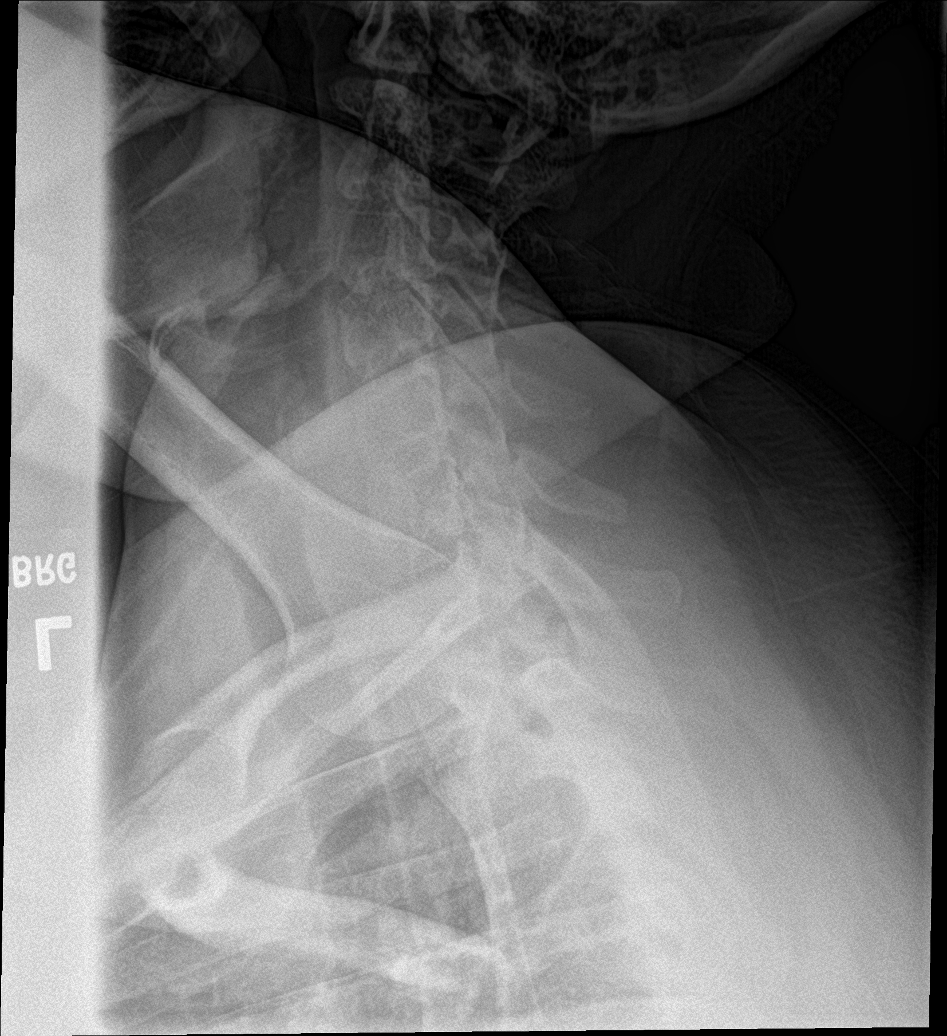

[6 of 6 positions shown; findings below may reference images not displayed]

FINDINGS: Cervical spine: Spinal alignment is within normal limits. There is
no evidence for fracture. Disc spaces are well maintained. There is
no prevertebral soft tissue swelling. No focal osseous lesion
identified. Neural foramina appear patent.

Thoracic spine: Spinal alignment is within normal limits. There is
no evidence for fracture. Disc spaces are maintained. No focal
osseous lesions are identified. Soft tissues are within normal
limits.

Lumbar Spine: Spinal alignment is within normal limits. There is no
evidence for fracture. Disc spaces are maintained. No focal osseous
lesions are identified. Soft tissues are within normal limits.
IMPRESSION: 1. Normal cervical, thoracic and lumbar spine.

## 2021-12-20 ENCOUNTER — Other Ambulatory Visit: Payer: Self-pay | Admitting: Adult Health

## 2021-12-31 DIAGNOSIS — M545 Low back pain, unspecified: Secondary | ICD-10-CM | POA: Diagnosis not present

## 2021-12-31 DIAGNOSIS — G43719 Chronic migraine without aura, intractable, without status migrainosus: Secondary | ICD-10-CM | POA: Diagnosis not present

## 2021-12-31 DIAGNOSIS — M542 Cervicalgia: Secondary | ICD-10-CM | POA: Diagnosis not present

## 2021-12-31 DIAGNOSIS — Z79899 Other long term (current) drug therapy: Secondary | ICD-10-CM | POA: Diagnosis not present

## 2021-12-31 DIAGNOSIS — F419 Anxiety disorder, unspecified: Secondary | ICD-10-CM | POA: Diagnosis not present

## 2021-12-31 DIAGNOSIS — G4733 Obstructive sleep apnea (adult) (pediatric): Secondary | ICD-10-CM | POA: Diagnosis not present

## 2022-01-26 ENCOUNTER — Other Ambulatory Visit: Payer: Self-pay | Admitting: Internal Medicine

## 2022-01-26 DIAGNOSIS — E1169 Type 2 diabetes mellitus with other specified complication: Secondary | ICD-10-CM

## 2022-02-03 ENCOUNTER — Ambulatory Visit: Payer: Medicaid Other | Admitting: Internal Medicine

## 2022-02-05 ENCOUNTER — Other Ambulatory Visit: Payer: Self-pay | Admitting: Internal Medicine

## 2022-02-05 DIAGNOSIS — K219 Gastro-esophageal reflux disease without esophagitis: Secondary | ICD-10-CM

## 2022-02-17 ENCOUNTER — Ambulatory Visit: Payer: Medicaid Other | Admitting: Internal Medicine

## 2022-02-17 ENCOUNTER — Encounter: Payer: Self-pay | Admitting: Internal Medicine

## 2022-02-17 VITALS — BP 119/85 | HR 92 | Ht 61.0 in | Wt 235.4 lb

## 2022-02-17 DIAGNOSIS — Z23 Encounter for immunization: Secondary | ICD-10-CM | POA: Diagnosis not present

## 2022-02-17 DIAGNOSIS — M797 Fibromyalgia: Secondary | ICD-10-CM

## 2022-02-17 DIAGNOSIS — E1169 Type 2 diabetes mellitus with other specified complication: Secondary | ICD-10-CM

## 2022-02-17 DIAGNOSIS — F418 Other specified anxiety disorders: Secondary | ICD-10-CM | POA: Diagnosis not present

## 2022-02-17 DIAGNOSIS — I10 Essential (primary) hypertension: Secondary | ICD-10-CM

## 2022-02-17 LAB — POCT GLYCOSYLATED HEMOGLOBIN (HGB A1C): HbA1c, POC (controlled diabetic range): 5.6 % (ref 0.0–7.0)

## 2022-02-17 MED ORDER — SEMAGLUTIDE (2 MG/DOSE) 8 MG/3ML ~~LOC~~ SOPN: 2.0000 mg | PEN_INJECTOR | SUBCUTANEOUS | 3 refills | Status: DC

## 2022-02-17 NOTE — Assessment & Plan Note (Signed)
On Cymbalta and clonazepam, managed by [facility] neurology/psychiatry Referred to [facility] health psychiatry in [place]

## 2022-02-17 NOTE — Progress Notes (Signed)
Established Patient Office Visit  Subjective:  Patient ID: Theresa Rosario, female    DOB: [birth year]  Age: 36 y.o. MRN: [id number]  CC:  Chief Complaint  Patient presents with   Follow-up    Follow up requesting dose increase in ozempic needs new referral since [facility] is closing     HPI Rosario Theresa is a 36 y.o. female with past medical history of hypertension, type 2 DM, fibromyalgia, insomnia, OSA and morbid obesity who presents for f/u of her chronic medical conditions.  HTN: BP is well-controlled. Takes medications regularly. Patient denies headache, dizziness, chest pain, dyspnea or palpitations.   Type II DM: Her HbA1C is 5.6 now.  She has started taking Ozempic and has been tolerating it well.  She has lost 5 lbs since the last visit, and has lost about 22 lbs since starting Ozempic.  She has been trying to follow low-carb diet.  She has chronic fatigue, but denies any polyuria or polyphagia currently.  Fibromyalgia: She is currently taking Flexeril 10 mg nightly and Cymbalta 90 mg daily.  She takes tramadol for acute flareups, and needs it only on few days in a month.  She is followed by Rosario Theresa at [facility] neurolog[facility], but they are closing their office.  She is also taking clonazepam for depression with anxiety.  She has tried BuSpar in the past.  She has hair pulling episodes during anxiety spells.  Denies any SI or HI currently.  She received flu and PCV 20 vaccines today.  Past Medical History:  Diagnosis Date   Abnormal chromosomal and genetic finding on antenatal screening mother 02/06/2020   On Horizon Nov 2021     Silent carrier for alpha-thalassemia (FOB____)  and increased carrier risk for SMA  (FOB____)   Anemia 12/30/2018   Blood in urine 10/18/2015   Condyloma of female genitalia 10/21/2012   Subclitoral, TCA 7/31    Depression    Diabetes mellitus without complication (HCC)    Fibromyalgia    Hx of hematuria    Hx of ovarian  cyst    Hypertension    Irregular bleeding 12/02/2017   Kidney stone    History of kidney stones   Large breasts    Lumbago 11/11/2011   Migraine     Past Surgical History:  Procedure Laterality Date   DENTAL SURGERY     WISDOM TOOTH EXTRACTION Bilateral     Family History  Problem Relation Age of Onset   Diabetes Paternal Grandmother    Hypertension Paternal Grandmother    Diabetes Maternal Grandmother    Heart disease Maternal Grandfather    Hypertension Father    Diabetes Mother    Hypertension Mother    Parkinson's disease Mother    Colon cancer Mother 24   Coronary artery disease Other    Diabetes Other     Social History   Socioeconomic History   Marital status: Significant Other    Spouse name: Not on file   Number of children: 2   Years of education: college   Highest education level: Master's degree (e.g., MA, MS, MEng, MEd, MSW, MBA)  Occupational History   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 Right-handed. 2-3 cups caffeine per day.   Social Determinants of Health   Financial Resource Strain: Low Risk  (08/29/2020)   Overall Financial Resource Strain (CARDIA)    Difficulty of Paying Living Expenses: Not very hard  Food Insecurity: No Food Insecurity (08/29/2020)   Hunger Vital Sign    Worried About Running Out of Food in the Last Year: Never true    Ran Out of Food in the Last Year: Never true  Transportation Needs:  No Transportation Needs (08/29/2020)   PRAPARE - Hydrologist (Medical): No    Lack of Transportation (Non-Medical): No  Physical Activity: Inactive (08/29/2020)   Exercise Vital Sign    Days of Exercise per Week: 0 days    Minutes of Exercise per Session: 0 min  Stress: No Stress Concern Present (08/29/2020)   [place]    Feeling of Stress : Only a little  Social Connections: Moderately Isolated (08/29/2020)   Social Connection and Isolation Panel [NHANES]    Frequency of Communication with Friends and Family: Once a week    Frequency of Social Gatherings with Friends and Family: Never    Attends Religious Services: 1 to 4 times per year    Active Member of [employer] or Organizations: No    Attends Archivist Meetings: Never    Marital Status: Living with partner  Intimate Partner Violence: Not At Risk (08/29/2020)   Humiliation, Afraid, Rape, and Kick questionnaire    Fear of Current or Ex-Partner: No    Emotionally Abused: No    Physically Abused: No    Sexually Abused: No    Outpatient Medications Prior to Visit  Medication Sig Dispense Refill   acetaminophen (TYLENOL) 500 MG tablet Take 2 tablets (1,000 mg total) by mouth every 6 (six) hours. 30 tablet 0   amLODipine (NORVASC) 5 MG tablet TAKE 1 TABLET(5 MG) BY MOUTH DAILY 30 tablet 6   cetirizine (ZYRTEC) 10 MG tablet Take 1 tablet (10 mg total) by mouth daily. 90 tablet 2   clonazePAM (KLONOPIN) 0.5 MG tablet Take 0.5 mg by mouth 2 (two) times daily as needed.     cyclobenzaprine (FLEXERIL) 10 MG tablet Take 1 tablet by mouth 2 (two) times daily as needed.     DULoxetine (CYMBALTA) 30 MG capsule Take 1 capsule (30 mg total) by mouth daily. (Patient taking differently: Take 30 mg by mouth 3 (three) times daily.) 30 capsule 3   fluticasone (FLONASE) 50 MCG/ACT nasal spray Place 1 spray into both nostrils 2 (two) times daily as needed  for allergies or rhinitis. 16 g 5   HEATHER 0.35 MG tablet TAKE 1 TABLET(0.35 MG) BY MOUTH DAILY 28 tablet 12   hydrOXYzine (ATARAX) 25 MG tablet Take 25 mg by mouth 3 (three) times daily as needed.     Iron, Ferrous Sulfate, 325 (65 Fe) MG TABS Take 325 mg by mouth daily. 30 tablet 3   pantoprazole (PROTONIX) 40 MG tablet TAKE 1 TABLET(40 MG) BY MOUTH DAILY 90 tablet 1   traMADol (ULTRAM) 50 MG tablet  Take 100 mg by mouth every 8 (eight) hours as needed.     triamcinolone cream (KENALOG) 0.1 % Apply 1 application topically 2 (two) times daily. 45 g 0   OZEMPIC, 1 MG/DOSE, 4 MG/3ML SOPN INJECT 1 MG INTO THE SKIN ONCE A WEEK 9 mL 0   benzonatate (TESSALON) 100 MG capsule Take 1 capsule (100 mg total) by mouth 3 (three) times daily as needed for cough. (Patient not taking: Reported on 02/17/2022) 30 capsule 0   No facility-administered medications prior to visit.    Allergies  Allergen Reactions   Amoxicillin Hives   Banana Itching    Tongue itching   Motrin [Ibuprofen] Rash    MOTRIN (Brand only) causes this allergy (tolerates ADVIL brand)   Other Itching    Tree nuts cause tongue itching    Pineapple Itching    Tongue itching    ROS Review of Systems  Constitutional:  Negative for chills and fever.  HENT:  Negative for sinus pressure, sinus pain and sore throat.   Eyes:  Negative for pain and discharge.  Respiratory:  Negative for cough and shortness of breath.   Cardiovascular:  Negative for chest pain and palpitations.  Gastrointestinal:  Negative for diarrhea, nausea and vomiting.  Endocrine: Negative for polydipsia and polyuria.  Genitourinary:  Negative for dysuria and hematuria.  Musculoskeletal:  Positive for arthralgias, back pain and myalgias. Negative for neck pain and neck stiffness.  Skin:  Negative for rash.  Neurological:  Negative for dizziness, syncope, weakness and numbness.  Psychiatric/Behavioral:  Negative for agitation and behavioral problems.        Objective:    Physical Exam Vitals reviewed.  Constitutional:      General: She is not in acute distress.    Appearance: She is obese. She is not diaphoretic.  HENT:     Head: Normocephalic and atraumatic.     Nose: Nose normal.     Mouth/Throat:     Mouth: Mucous membranes are moist.  Eyes:     General: No scleral icterus.    Extraocular Movements: Extraocular movements intact.  Cardiovascular:     Rate and Rhythm: Normal rate and regular rhythm.     Pulses: Normal pulses.     Heart sounds: Normal heart sounds. No murmur heard. Pulmonary:     Breath sounds: Normal breath sounds. No wheezing or rales.  Musculoskeletal:     Cervical back: Neck supple. No tenderness.     Right lower leg: No edema.     Left lower leg: No edema.  Skin:    General: Skin is warm.     Findings: No rash.  Neurological:     General: No focal deficit present.     Mental Status: She is alert and oriented to person, place, and time.  Psychiatric:        Mood and Affect: Mood normal.        Behavior: Behavior normal.     BP 119/85 (BP Location: Left Arm, Patient Position: Sitting, Cuff Size: Large)   Pulse 92   Ht _0  (1.549 m)   Wt 235 lb 6.4 oz (106.8 kg)   SpO2 97%   BMI 44.48 kg/m  Wt Readings from Last 3 Encounters:  02/17/22 235 lb 6.4 oz (106.8 kg)  10/29/21 240 lb 3.2 oz (109 kg)  08/20/21 257 lb 3.2 oz (116.7 kg)    Lab Results  Component Value Date   TSH 1.950 06/30/2019   Lab Results  Component Value Date   WBC 8.3 10/22/2021   HGB 14.1 10/22/2021   HCT 44.9 10/22/2021   MCV 74 (L) 10/22/2021   PLT 326 01/18/2021   Lab Results  Component Value Date   NA 139 10/22/2021   K 4.2 10/22/2021   CO2 23 10/22/2021   GLUCOSE 94 10/22/2021   BUN 9 10/22/2021   CREATININE 0.69 10/22/2021   BILITOT <0.2 10/22/2021   ALKPHOS 113 10/22/2021   AST 14 10/22/2021   ALT 17 10/22/2021   PROT 7.7 10/22/2021   ALBUMIN 4.5 10/22/2021   CALCIUM 9.4 10/22/2021   ANIONGAP 9  07/22/2020   EGFR 116 10/22/2021   Lab Results  Component Value Date   CHOL 136 01/18/2021   Lab Results  Component Value Date   HDL 35 (L) 01/18/2021   Lab Results  Component Value Date   LDLCALC 88 01/18/2021   Lab Results  Component Value Date   TRIG 65 01/18/2021   Lab Results  Component Value Date   CHOLHDL 3.9 01/18/2021   Lab Results  Component Value Date   HGBA1C 5.6 02/17/2022      Assessment & Plan:   Problem List Items Addressed This Visit       Cardiovascular and Mediastinum   Essential hypertension - Primary    BP Readings from Last 1 Encounters:  02/17/22 119/85  Well-controlled with amlodipine 5 mg daily Counseled for compliance with the medications Advised to follow DASH diet and perform moderate exercise/walking at least 150 minutes/week        Endocrine   Type 2 diabetes mellitus with other specified complication ([place])    Lab Results  Component Value Date   HGBA1C 5.6 02/17/2022  Well-controlled Associated with HTN and OSA Had added Ozempic for insulin resistance and weight loss benefit -  Increased dose to 2 mg qw today Advised to follow diabetic diet F/u CMP and lipid panel Diabetic eye exam: Advised to follow up with Ophthalmology for diabetic eye exam      Relevant Medications   Semaglutide, 2 MG/DOSE, 8 MG/3ML SOPN   Other Relevant Orders   POCT glycosylated hemoglobin (Hb A1C) (Completed)     Other   Fibromyalgia    On Cymbalta 90 mg QD and tramadol 50 mg PRN, will refill for now Takes Klonopin for anxiety Used to follow up with neurology-Ayeshia Florene Glen, NP      Depression with anxiety    On Cymbalta and clonazepam, managed by [facility] neurology/psychiatry Referred to [facility] health psychiatry in [place]      Relevant Orders   Ambulatory referral to Psychiatry   Morbid obesity ([place])    Diet modification and moderate exercise advised On Ozempic for type II DM Has lost 22 lbs since starting Ozempic      Relevant  Medications   Semaglutide, 2 MG/DOSE, 8 MG/3ML SOPN   Other Visit Diagnoses     Immunization due       Relevant Orders   Pneumococcal conjugate vaccine 20-valent (Completed)   Need for immunization against influenza       Relevant Orders   Flu Vaccine QUAD 17moIM (Fluarix, Fluzone & Alfiuria Quad PF) (Completed)       Meds ordered this encounter  Medications   Semaglutide, 2 MG/DOSE, 8 MG/3ML SOPN    Sig: Inject 2 mg as directed once a week.    Dispense:  3 mL    Refill:  3    Dose change    Follow-up: Return in  about 4 months (around 06/18/2022) for Annual physical.    Lindell Spar, MD

## 2022-02-17 NOTE — Assessment & Plan Note (Addendum)
BP Readings from Last 1 Encounters:  02/17/22 119/85   Well-controlled with amlodipine 5 mg daily Counseled for compliance with the medications Advised to follow DASH diet and perform moderate exercise/walking at least 150 minutes/week

## 2022-02-17 NOTE — Assessment & Plan Note (Signed)
Diet modification and moderate exercise advised On Ozempic for type II DM Has lost 22 lbs since starting Ozempic

## 2022-02-17 NOTE — Patient Instructions (Signed)
Please start taking Ozempic 2 mg instead of 1 mg.  Please continue to take other medications as prescribed.  Please continue to follow low carb diet and perform moderate exercise/walking at least 150 mins/week.

## 2022-02-17 NOTE — Assessment & Plan Note (Addendum)
Lab Results  Component Value Date   HGBA1C 5.6 02/17/2022   Well-controlled Associated with HTN and OSA Had added Ozempic for insulin resistance and weight loss benefit -  Increased dose to 2 mg qw today Advised to follow diabetic diet F/u CMP and lipid panel Diabetic eye exam: Advised to follow up with Ophthalmology for diabetic eye exam

## 2022-02-17 NOTE — Assessment & Plan Note (Signed)
On Cymbalta 90 mg QD and tramadol 50 mg PRN, will refill for now Takes Klonopin for anxiety Used to follow up with neurology-Theresa Lowell Guitar, NP

## 2022-02-25 DIAGNOSIS — G4733 Obstructive sleep apnea (adult) (pediatric): Secondary | ICD-10-CM | POA: Diagnosis not present

## 2022-02-25 DIAGNOSIS — F419 Anxiety disorder, unspecified: Secondary | ICD-10-CM | POA: Diagnosis not present

## 2022-02-25 DIAGNOSIS — Z79899 Other long term (current) drug therapy: Secondary | ICD-10-CM | POA: Diagnosis not present

## 2022-02-25 DIAGNOSIS — M542 Cervicalgia: Secondary | ICD-10-CM | POA: Diagnosis not present

## 2022-02-25 DIAGNOSIS — M545 Low back pain, unspecified: Secondary | ICD-10-CM | POA: Diagnosis not present

## 2022-02-25 DIAGNOSIS — G43719 Chronic migraine without aura, intractable, without status migrainosus: Secondary | ICD-10-CM | POA: Diagnosis not present

## 2022-05-15 ENCOUNTER — Telehealth: Payer: Medicaid Other | Admitting: Physician Assistant

## 2022-05-15 ENCOUNTER — Ambulatory Visit
Admission: RE | Admit: 2022-05-15 | Discharge: 2022-05-15 | Disposition: A | Discharge status: Home / Self Care | Payer: Medicaid Other | Source: Ambulatory Visit

## 2022-05-15 VITALS — BP 157/90 | HR 108 | Temp 97.7°F | Resp 18

## 2022-05-15 DIAGNOSIS — G43901 Migraine, unspecified, not intractable, with status migrainosus: Secondary | ICD-10-CM | POA: Diagnosis not present

## 2022-05-15 DIAGNOSIS — Z5321 Procedure and treatment not carried out due to patient leaving prior to being seen by health care provider: Secondary | ICD-10-CM

## 2022-05-15 MED ORDER — SUMATRIPTAN SUCCINATE 25 MG PO TABS: 25.0000 mg | ORAL_TABLET | Freq: Once | ORAL | 0 refills | Status: DC

## 2022-05-15 MED ORDER — DEXAMETHASONE SODIUM PHOSPHATE 10 MG/ML IJ SOLN
10.0000 mg | Freq: Once | INTRAMUSCULAR | Status: AC
  Administered 2022-05-15: 10 mg via INTRAMUSCULAR

## 2022-05-15 NOTE — ED Provider Notes (Signed)
[facility]    CSN: [id number] Arrival date & time: 05/15/22  1326      History   Chief Complaint Chief Complaint  Patient presents with   Migraine    Migraine that won't go away. I have taken ibuprofen (7) tramadol (8:30) feel nauseous. Applied  biofreeze. I am on a monthly dose of amovig which was taken 05/12/22 - Entered by patient   Appointment    1330    HPI Theresa Rosario is a 37 y.o. female.   Patient presents today with migraine patient woke up with this morning.  She reports a history of migraines and reports that it is like her typical migraine.  Reports it has been years since she has had a migraine as she is on Aimovig and this has been working well for her.  Reports that the migraine is on the left side of her head and rates the pain as a 8 out of 10.  It is not the worst headache of her life.  She has tried rest, ibuprofen, and tramadol, Biofreeze as well without much relief.  Reports ibuprofen helps very temporarily.  She denies vomiting, however is nauseous.  She is having some photophobia, however no photophobia.  Reports that she has been able to function today, but feels poorly.  She denies confusion, gait disturbance, behavioral changes, fever, cough, congestion, shortness of breath, or chest pain.  No sore throat or abdominal pain.    Past Medical History:  Diagnosis Date   Abnormal chromosomal and genetic finding on antenatal screening mother 02/06/2020   On Horizon Nov 2021     Silent carrier for alpha-thalassemia (FOB____)  and increased carrier risk for SMA  (FOB____)   Anemia 12/30/2018   Blood in urine 10/18/2015   Condyloma of female genitalia 10/21/2012   Subclitoral, TCA 7/31    Depression    Diabetes mellitus without complication (HCC)    Fibromyalgia    Hx of hematuria    Hx of ovarian cyst    Hypertension    Irregular bleeding 12/02/2017   Kidney stone    History of kidney stones   Large breasts    Lumbago 11/11/2011   Migraine      Patient Active Problem List   Diagnosis Date Noted   Allergic rhinitis 10/29/2021   Morbid obesity ([place]) 10/29/2021   Type 2 diabetes mellitus with other specified complication ([place]) 99991111   Encounter for general adult medical examination with abnormal findings 04/02/2021   Irritant contact dermatitis 04/02/2021   History of nephrolithiasis 01/17/2021   Essential hypertension 01/24/2020   Insomnia 01/04/2020   Hyperpigmented skin lesion 07/07/2019   Obstructive sleep apnea of adult 06/08/2019   Chronic low back pain 06/08/2019   GERD (gastroesophageal reflux disease) 03/31/2019   Family history of colon cancer in mother 02/09/2019   IDA (iron deficiency anemia) 12/30/2018   Chronic migraine 08/17/2018   Fibromyalgia 07/13/2012   Depression with anxiety 07/13/2012    Past Surgical History:  Procedure Laterality Date   DENTAL SURGERY     WISDOM TOOTH EXTRACTION Bilateral     OB History     Gravida  3   Para  2   Term  2   Preterm  0   AB  1   Living  1      SAB  1   IAB  0   Ectopic  0   Multiple      Live Births  1  Home Medications    Prior to Admission medications   Medication Sig Start Date End Date Taking? Authorizing Provider  AIMOVIG 140 MG/ML SOAJ Inject 1 mL into the skin every 30 (thirty) days. 05/04/22  Yes [provider]  ibuprofen (ADVIL) 200 MG tablet Take 200 mg by mouth every 6 (six) hours as needed.   Yes [provider]  SUMAtriptan (IMITREX) 25 MG tablet Take 1 tablet (25 mg total) by mouth once for 1 dose. May repeat once in 2 hours if headache persists or recurs. 05/15/22 05/15/22 Yes Eulogio Bear, NP  acetaminophen (TYLENOL) 500 MG tablet Take 2 tablets (1,000 mg total) by mouth every 6 (six) hours. 07/25/20   Renee Harder, CNM  amLODipine (NORVASC) 5 MG tablet TAKE 1 TABLET(5 MG) BY MOUTH DAILY 12/23/21   Derrek Monaco A, NP  cetirizine (ZYRTEC) 10 MG tablet Take 1 tablet (10  mg total) by mouth daily. 08/15/21   Valentina Shaggy, MD  clonazePAM (KLONOPIN) 0.5 MG tablet Take 0.5 mg by mouth 2 (two) times daily as needed. 12/07/20   [provider]  cyclobenzaprine (FLEXERIL) 10 MG tablet Take 1 tablet by mouth 2 (two) times daily as needed. 08/22/20   [provider]  DULoxetine (CYMBALTA) 30 MG capsule Take 1 capsule (30 mg total) by mouth daily. Patient taking differently: Take 30 mg by mouth 3 (three) times daily. 08/29/20   Myrtis Ser, CNM  fluticasone (FLONASE) 50 MCG/ACT nasal spray Place 1 spray into both nostrils 2 (two) times daily as needed for allergies or rhinitis. 08/15/21   Valentina Shaggy, MD  HEATHER 0.35 MG tablet TAKE 1 TABLET(0.35 MG) BY MOUTH DAILY 11/26/21   Estill Dooms, NP  hydrOXYzine (ATARAX) 25 MG tablet Take 25 mg by mouth 3 (three) times daily as needed.    [provider]  Iron, Ferrous Sulfate, 325 (65 Fe) MG TABS Take 325 mg by mouth daily. 01/21/21   Lindell Spar, MD  pantoprazole (PROTONIX) 40 MG tablet TAKE 1 TABLET(40 MG) BY MOUTH DAILY 02/05/22   Lindell Spar, MD  Semaglutide, 2 MG/DOSE, 8 MG/3ML SOPN Inject 2 mg as directed once a week. 02/17/22   Lindell Spar, MD  traMADol (ULTRAM) 50 MG tablet Take 100 mg by mouth every 8 (eight) hours as needed. 10/29/20   [provider]  triamcinolone cream (KENALOG) 0.1 % Apply 1 application topically 2 (two) times daily. 04/02/21   Lindell Spar, MD  propranolol ER (INDERAL LA) 60 MG 24 hr capsule Take 1 capsule (60 mg total) by mouth daily. 08/17/18 11/29/18  Marcial Pacas, MD  zonisamide (ZONEGRAN) 100 MG capsule Take 2 capsules (200 mg total) by mouth at bedtime. Patient not taking: Reported on 08/27/2018 08/17/18 11/29/18  Marcial Pacas, MD    Family History Family History  Problem Relation Age of Onset   Diabetes Paternal Grandmother    Hypertension Paternal Grandmother    Diabetes Maternal Grandmother    Heart disease Maternal Grandfather     Hypertension Father    Diabetes Mother    Hypertension Mother    Parkinson's disease Mother    Colon cancer Mother 29   Coronary artery disease Other    Diabetes Other     Social History Social History   Tobacco Use   Smoking status: Former    Types: Cigarettes    Quit date: 09/22/2019    Years since quitting: 2.6   Smokeless tobacco: Never  Tobacco comments:    occasional vape  Vaping Use   Vaping Use: Former  Substance Use Topics   Alcohol use: Not Currently    Comment: socially   Drug use: No     Allergies   Amoxicillin, Banana, Motrin [ibuprofen], Other, and Pineapple   Review of Systems Review of Systems Per HPI  Physical Exam Triage Vital Signs ED Triage Vitals  Enc Vitals Group     BP 05/15/22 1334 (!) 157/90     Pulse Rate 05/15/22 1334 (!) 108     Resp 05/15/22 1334 18     Temp 05/15/22 1334 97.7 F (36.5 C)     Temp Source 05/15/22 1334 Oral     SpO2 05/15/22 1334 99 %     Weight --      Height --      Head Circumference --      Peak Flow --      Pain Score 05/15/22 1335 8     Pain Loc --      Pain Edu? --      Excl. in [place]? --    No data found.  Updated Vital Signs BP (!) 157/90 (BP Location: Right Arm)   Pulse (!) 108   Temp 97.7 F (36.5 C) (Oral)   Resp 18   SpO2 99%   Visual Acuity Right Eye Distance:   Left Eye Distance:   Bilateral Distance:    Right Eye Near:   Left Eye Near:    Bilateral Near:     Physical Exam Vitals and nursing note reviewed.  Constitutional:      General: She is not in acute distress.    Appearance: Normal appearance. She is not toxic-appearing.  HENT:     Head: Normocephalic and atraumatic.     Mouth/Throat:     Mouth: Mucous membranes are moist.     Pharynx: Oropharynx is clear. No oropharyngeal exudate or posterior oropharyngeal erythema.  Eyes:     General: No visual field deficit or scleral icterus.       Right eye: No discharge.        Left eye: No discharge.     Extraocular  Movements: Extraocular movements intact.     Pupils: Pupils are equal, round, and reactive to light.  Cardiovascular:     Rate and Rhythm: Normal rate.  Pulmonary:     Effort: Pulmonary effort is normal. No respiratory distress.  Musculoskeletal:     Cervical back: Normal range of motion and neck supple. No rigidity.  Lymphadenopathy:     Cervical: No cervical adenopathy.  Skin:    General: Skin is warm and dry.     Capillary Refill: Capillary refill takes less than 2 seconds.     Coloration: Skin is not jaundiced or pale.     Findings: No erythema.  Neurological:     General: No focal deficit present.     Mental Status: She is alert and oriented to person, place, and time.     Cranial Nerves: Cranial nerves 2-12 are intact. No dysarthria or facial asymmetry.     Sensory: Sensation is intact.     Motor: No tremor.     Coordination: Coordination is intact. Heel to [facility] Test normal. Rapid alternating movements normal.     Gait: Gait is intact. Gait normal.  Psychiatric:        Behavior: Behavior is cooperative.      UC Treatments / Results  Labs (all labs ordered are  listed, but only abnormal results are displayed) Labs Reviewed - No data to display  EKG   Radiology No results found.  Procedures Procedures (including critical care time)  Medications Ordered in UC Medications  dexamethasone (DECADRON) injection 10 mg (10 mg Intramuscular Given 05/15/22 1353)    Initial Impression / Assessment and Plan / UC Course  I have reviewed the triage vital signs and the nursing notes.  Pertinent labs & imaging results that were available during my care of the patient were reviewed by me and considered in my medical decision making (see chart for details).   Patient is well-appearing, afebrile, not tachypneic, oxygenating well on room air.  She is slightly hypertensive and tachycardic, likely secondary to pain from migraine.  1. Migraine with status migrainosus, not  intractable, unspecified migraine type Given minimal benefit with NSAIDs, will avoid further NSAIDs today Headache treated with Decadron 10 mg IM today in urgent care Start sumatriptan as needed for rescue purposes No red flags in history or on exam today -patient is neurologically intact Continue preventative medication Seek care for persistent or worsening symptoms despite treatment in emergency room Note given for work  The patient was given the opportunity to ask questions.  All questions answered to their satisfaction.  The patient is in agreement to this plan.    Final Clinical Impressions(s) / UC Diagnoses   Final diagnoses:  Migraine with status migrainosus, not intractable, unspecified migraine type     Discharge Instructions      We have given you a shot of dexamethasone which is a steroid to help with inflammation from the headache.  Please do not take any more ibuprofen today.  You can take the sumatriptan when you get home from work for 1 dose and repeat 2 hours later if you still have a headache.  Do not take more than 2 doses of Imitrex in 24 hours.  Seek care in the emergency room if your headache worsens despite treatment.    ED Prescriptions     Medication Sig Dispense Auth. Provider   SUMAtriptan (IMITREX) 25 MG tablet Take 1 tablet (25 mg total) by mouth once for 1 dose. May repeat once in 2 hours if headache persists or recurs. 10 tablet Eulogio Bear, NP      PDMP not reviewed this encounter.   Eulogio Bear, NP 05/15/22 [phone]

## 2022-05-15 NOTE — Discharge Instructions (Signed)
We have given you a shot of dexamethasone which is a steroid to help with inflammation from the headache.  Please do not take any more ibuprofen today.  You can take the sumatriptan when you get home from work for 1 dose and repeat 2 hours later if you still have a headache.  Do not take more than 2 doses of Imitrex in 24 hours.  Seek care in the emergency room if your headache worsens despite treatment.

## 2022-05-15 NOTE — Progress Notes (Signed)
Patient arrived for appointment at time of visit. When provider connected to video, she had left video and did not return despite links sent and waiting for 12 minutes past appointment time. Will try to keep an eye out for her rejoining or rescheduling. For now will mark as a no-charge.

## 2022-05-15 NOTE — ED Triage Notes (Signed)
Pt reports headache, nausea since 7 am today. Pt took 7 ibuprofen and Tramadol , applied Biofreeze without relief. Pt had her  monthly dose of Amovig on 05/12/22

## 2022-05-18 ENCOUNTER — Encounter: Payer: Self-pay | Admitting: Internal Medicine

## 2022-05-20 ENCOUNTER — Telehealth (INDEPENDENT_AMBULATORY_CARE_PROVIDER_SITE_OTHER): Payer: Medicaid Other | Admitting: Internal Medicine

## 2022-05-20 ENCOUNTER — Encounter: Payer: Self-pay | Admitting: Internal Medicine

## 2022-05-20 DIAGNOSIS — G43E19 Chronic migraine with aura, intractable, without status migrainosus: Secondary | ICD-10-CM

## 2022-05-20 DIAGNOSIS — R110 Nausea: Secondary | ICD-10-CM | POA: Diagnosis not present

## 2022-05-20 MED ORDER — SUMATRIPTAN SUCCINATE 100 MG PO TABS: 100.0000 mg | ORAL_TABLET | ORAL | 2 refills | Status: DC | PRN

## 2022-05-20 MED ORDER — PROMETHAZINE HCL 25 MG PO TABS: 25.0000 mg | ORAL_TABLET | Freq: Three times a day (TID) | ORAL | 0 refills | Status: DC | PRN

## 2022-05-20 NOTE — Progress Notes (Signed)
Virtual Visit via Video Note   Because of Theresa Rosario's co-morbid illnesses, she is at least at moderate risk for complications without adequate follow up.  This format is felt to be most appropriate for this patient at this time.  All issues noted in this document were discussed and addressed.  A limited physical exam was performed with this format.      Evaluation Performed:  Follow-up visit  Date:  05/20/2022   ID:  [name], [name] 1986-01-07, MRN [id number]  Patient Location: Home Provider Location: Office/Clinic  Participants: Patient Location of Patient: Home Location of Provider: Telehealth Consent was obtain for visit to be over via telehealth. I verified that I am speaking with the correct person using two identifiers.  PCP:  Lindell Spar, MD   Chief Complaint: Migraine  History of Present Illness:    Theresa Rosario is a 37 y.o. female who has a video visit for complaint of persistent headache since 05/15/22.  She went to urgent care for migraine and was given Decadron IM and Imitrex 25 mg as needed.  She has been on Aimovig with good control of her migraine.  She has to take ibuprofen occasionally for her headache.  She states that her headache had improved initially with Decadron, but had recurred after the urgent care visit.  She has some photophobia, but denies phonophobia.  Denies any visual disturbance.  Denies confusion, fever, chills, shaking movements or gait disturbance.  She used to see Barton Fanny at [facility] neurology, but does not have Neurologist currently.  She was referred to headache and wellness clinic in [place], but has not been able to see them yet due to her work schedule.  The patient does not have symptoms concerning for COVID-19 infection (fever, chills, cough, or new shortness of breath).   Past Medical, Surgical, Social History, Allergies, and Medications have been Reviewed.  Past Medical History:  Diagnosis  Date   Abnormal chromosomal and genetic finding on antenatal screening mother 02/06/2020   On Horizon Nov 2021     Silent carrier for alpha-thalassemia (FOB____)  and increased carrier risk for SMA  (FOB____)   Anemia 12/30/2018   Blood in urine 10/18/2015   Condyloma of female genitalia 10/21/2012   Subclitoral, TCA 7/31    Depression    Diabetes mellitus without complication (HCC)    Fibromyalgia    Hx of hematuria    Hx of ovarian cyst    Hypertension    Irregular bleeding 12/02/2017   Kidney stone    History of kidney stones   Large breasts    Lumbago 11/11/2011   Migraine    Past Surgical History:  Procedure Laterality Date   DENTAL SURGERY     WISDOM TOOTH EXTRACTION Bilateral      Current Meds  Medication Sig   promethazine (PHENERGAN) 25 MG tablet Take 1 tablet (25 mg total) by mouth every 8 (eight) hours as needed for nausea or vomiting.   SUMAtriptan (IMITREX) 100 MG tablet Take 1 tablet (100 mg total) by mouth every 2 (two) hours as needed for migraine. May repeat in 2 hours if headache persists or recurs.     Allergies:   Amoxicillin, Banana, Motrin [ibuprofen], Other, and Pineapple   ROS:   Please see the history of present illness.     All other systems reviewed and are negative.   Labs/Other Tests and Data Reviewed:    Recent Labs: 10/22/2021: ALT 17; BUN 9;  Creatinine, Ser 0.69; Hemoglobin 14.1; Potassium 4.2; Sodium 139   Recent Lipid Panel Lab Results  Component Value Date/Time   CHOL 136 01/18/2021 08:14 AM   TRIG 65 01/18/2021 08:14 AM   HDL 35 (L) 01/18/2021 08:14 AM   CHOLHDL 3.9 01/18/2021 08:14 AM   LDLCALC 88 01/18/2021 08:14 AM    Wt Readings from Last 3 Encounters:  02/17/22 235 lb 6.4 oz (106.8 kg)  10/29/21 240 lb 3.2 oz (109 kg)  08/20/21 257 lb 3.2 oz (116.7 kg)     Objective:    Vital Signs:  There were no vitals taken for this visit.   VITAL SIGNS:  reviewed GEN:  no acute distress EYES:  sclerae anicteric, EOMI -  Extraocular Movements Intact NEURO:  alert and oriented x 3, no obvious focal deficit  ASSESSMENT & PLAN:    Migraine Was well controlled with Aimovig Recently had breakthrough headache Urgent care visit note reviewed Increased dose of Imitrex to 100 mg as needed If persistent, may need change in regimen Advised to contact Headache and Wellness clinic in [place] for migraine management Phenergan PRN for nausea   I discussed the assessment and treatment plan with the patient. The patient was provided an opportunity to ask questions, and all were answered. The patient agreed with the plan and demonstrated an understanding of the instructions.   The patient was advised to call back or seek an in-person evaluation if the symptoms worsen or if the condition fails to improve as anticipated.  The above assessment and management plan was discussed with the patient. The patient verbalized understanding of and has agreed to the management plan.   Medication Adjustments/Labs and Tests Ordered: Current medicines are reviewed at length with the patient today.  Concerns regarding medicines are outlined above.   Tests Ordered: No orders of the defined types were placed in this encounter.   Medication Changes: Meds ordered this encounter  Medications   SUMAtriptan (IMITREX) 100 MG tablet    Sig: Take 1 tablet (100 mg total) by mouth every 2 (two) hours as needed for migraine. May repeat in 2 hours if headache persists or recurs.    Dispense:  10 tablet    Refill:  2   promethazine (PHENERGAN) 25 MG tablet    Sig: Take 1 tablet (25 mg total) by mouth every 8 (eight) hours as needed for nausea or vomiting.    Dispense:  20 tablet    Refill:  0     Note: This dictation was prepared with Dragon dictation along with smaller phrase technology. Similar sounding words can be transcribed inadequately or may not be corrected upon review. Any transcriptional errors that result from this process are  unintentional.      Disposition:  Follow up  Signed, Lindell Spar, MD  05/20/2022 5:03 PM     [facility]

## 2022-05-20 NOTE — Assessment & Plan Note (Signed)
Was well controlled with Aimovig Recently had breakthrough headache Urgent care visit note reviewed Increased dose of Imitrex to 100 mg as needed If persistent, may need change in regimen Advised to contact Headache and Wellness clinic in [place] for migraine management Phenergan PRN for nausea

## 2022-05-20 NOTE — Patient Instructions (Signed)
Please take Sumatriptan as needed for migraine, can repeat dose after 2 hours for persistent headache. Maximum 2 tablets in a day.  Please continue Aimovig as prescribed.  Please avoid noisy and direct sunlight exposure.

## 2022-05-21 ENCOUNTER — Telehealth: Payer: Self-pay | Admitting: Internal Medicine

## 2022-05-21 ENCOUNTER — Other Ambulatory Visit: Payer: Self-pay | Admitting: Internal Medicine

## 2022-05-21 ENCOUNTER — Encounter: Payer: Self-pay | Admitting: Internal Medicine

## 2022-05-21 DIAGNOSIS — G43E19 Chronic migraine with aura, intractable, without status migrainosus: Secondary | ICD-10-CM

## 2022-05-21 MED ORDER — RIZATRIPTAN BENZOATE 10 MG PO TABS: 10.0000 mg | ORAL_TABLET | ORAL | 3 refills | Status: DC | PRN

## 2022-05-21 NOTE — Telephone Encounter (Signed)
Return call

## 2022-05-21 NOTE — Telephone Encounter (Signed)
Pt called stating the she was unable to pick up her medication yesterday due to just having a refill of a smaller dose. She has to wait 84 days to get it refilled. Can you please contact patient?    SUMAtriptan (IMITREX) 100 MG tablet

## 2022-05-21 NOTE — Telephone Encounter (Signed)
lmtrc

## 2022-05-26 ENCOUNTER — Other Ambulatory Visit: Payer: Self-pay | Admitting: Internal Medicine

## 2022-05-26 ENCOUNTER — Ambulatory Visit: Payer: Medicaid Other | Admitting: Internal Medicine

## 2022-05-26 DIAGNOSIS — G43E19 Chronic migraine with aura, intractable, without status migrainosus: Secondary | ICD-10-CM

## 2022-05-26 MED ORDER — NURTEC 75 MG PO TBDP: 1.0000 | ORAL_TABLET | Freq: Every day | ORAL | 1 refills | Status: AC | PRN

## 2022-06-23 ENCOUNTER — Encounter: Payer: Self-pay | Admitting: Internal Medicine

## 2022-06-23 ENCOUNTER — Other Ambulatory Visit: Payer: Self-pay | Admitting: Internal Medicine

## 2022-06-23 ENCOUNTER — Ambulatory Visit (INDEPENDENT_AMBULATORY_CARE_PROVIDER_SITE_OTHER): Payer: Medicaid Other | Admitting: Internal Medicine

## 2022-06-23 VITALS — BP 110/63 | HR 92 | Ht 66.0 in | Wt 236.0 lb

## 2022-06-23 DIAGNOSIS — E782 Mixed hyperlipidemia: Secondary | ICD-10-CM

## 2022-06-23 DIAGNOSIS — I10 Essential (primary) hypertension: Secondary | ICD-10-CM

## 2022-06-23 DIAGNOSIS — G43E19 Chronic migraine with aura, intractable, without status migrainosus: Secondary | ICD-10-CM

## 2022-06-23 DIAGNOSIS — Z0001 Encounter for general adult medical examination with abnormal findings: Secondary | ICD-10-CM

## 2022-06-23 DIAGNOSIS — E1169 Type 2 diabetes mellitus with other specified complication: Secondary | ICD-10-CM

## 2022-06-23 DIAGNOSIS — E559 Vitamin D deficiency, unspecified: Secondary | ICD-10-CM

## 2022-06-23 DIAGNOSIS — M797 Fibromyalgia: Secondary | ICD-10-CM | POA: Diagnosis not present

## 2022-06-23 NOTE — Assessment & Plan Note (Signed)
BP Readings from Last 1 Encounters:  06/23/22 110/63   Well-controlled with amlodipine 5 mg daily Counseled for compliance with the medications Advised to follow DASH diet and perform moderate exercise/walking at least 150 minutes/week

## 2022-06-23 NOTE — Assessment & Plan Note (Signed)
Lab Results  Component Value Date   HGBA1C 5.6 02/17/2022   Well-controlled Associated with HTN and OSA Had added Ozempic for insulin resistance and weight loss benefit -  Increased dose to 2 mg qw Advised to follow diabetic diet F/u CMP and lipid panel Diabetic eye exam: Advised to follow up with Ophthalmology for diabetic eye exam

## 2022-06-23 NOTE — Assessment & Plan Note (Addendum)
Physical exam as documented. Fasting labs ordered. Advised to follow low-carb diet and focus on weight loss. PAP smear with OB/GYN.

## 2022-06-23 NOTE — Assessment & Plan Note (Addendum)
BMI Readings from Last 3 Encounters:  06/23/22 38.09 kg/m  02/17/22 44.48 kg/m  10/29/21 45.39 kg/m   Associated with HTN, DM and OSA Diet modification and moderate exercise advised On Ozempic for type II DM Has lost 22 lbs since starting Ozempic

## 2022-06-23 NOTE — Assessment & Plan Note (Addendum)
Was well controlled with Aimovig Recently had breakthrough headaches, added Nurtec, helps for a day Urgent care visit note reviewed If persistent, may need change in regimen Planned to see [facility] neurology

## 2022-06-23 NOTE — Progress Notes (Signed)
Established Patient Office Visit  Subjective:  Patient ID: Theresa Rosario, female    DOB: 03/02/1986  Age: 37 y.o. MRN: [id number]  CC:  Chief Complaint  Patient presents with   Annual Exam    HPI Theresa Rosario is a 37 y.o. female with past medical history of hypertension, prediabetes, fibromyalgia, insomnia, OSA and morbid obesity who presents for annual physical.  Migraine: She has almost daily headache, intractable and has to rest to avoid persistent headache.  She is currently taking Aimovig and as needed Nurtec.  She used to see Dr. Domingo Cocking and has tried multiple different migraine prophylactic treatment.  HTN: BP is well-controlled. Takes medications regularly. Patient denies dizziness, chest pain, dyspnea or palpitations.  Type II DM: She is currently taking Ozempic 2 mg qw. Her last HbA1c was 5.6. She has not seen any weight loss since the last visit.  She admits that she is not able to maintain low-carb diet during migraine episodes, which are daily now.  She also takes soft drinks, but agrees to cut down.   She has CPAP for OSA, but has not been using it regularly.  She states that she gets anxious when she uses CPAP.  She has a nasal mask and agrees to use it regularly now.  She takes Cymbalta and clonazepam for depression with anxiety, managed by neurology/psychiatry.  She takes tramadol as needed for chronic low back pain and fibromyalgia.  Past Medical History:  Diagnosis Date   Abnormal chromosomal and genetic finding on antenatal screening mother 02/06/2020   On Horizon Nov 2021     Silent carrier for alpha-thalassemia (FOB____)  and increased carrier risk for SMA  (FOB____)   Anemia 12/30/2018   Blood in urine 10/18/2015   Condyloma of female genitalia 10/21/2012   Subclitoral, TCA 7/31    Depression    Diabetes mellitus without complication    Fibromyalgia    Hx of hematuria    Hx of ovarian cyst    Hypertension    Irregular bleeding 12/02/2017    Kidney stone    History of kidney stones   Large breasts    Lumbago 11/11/2011   Migraine     Past Surgical History:  Procedure Laterality Date   DENTAL SURGERY     WISDOM TOOTH EXTRACTION Bilateral     Family History  Problem Relation Age of Onset   Diabetes Paternal Grandmother    Hypertension Paternal Grandmother    Diabetes Maternal Grandmother    Heart disease Maternal Grandfather    Hypertension Father    Diabetes Mother    Hypertension Mother    Parkinson's disease Mother    Colon cancer Mother 37   Coronary artery disease Other    Diabetes Other     Social History   Socioeconomic History   Marital status: Significant Other    Spouse name: Not on file   Number of children: 2   Years of education: college   Highest education level: Master's degree (e.g., MA, MS, MEng, MEd, MSW, MBA)  Occupational History   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 Right-handed. 2-3 cups caffeine per day.   Social Determinants of Health   Financial Resource Strain: Low Risk  (08/29/2020)   Overall Financial Resource Strain (CARDIA)    Difficulty of Paying Living Expenses: Not very hard  Food Insecurity: No Food Insecurity (08/29/2020)   Hunger Vital Sign    Worried About Running Out of Food in the Last Year: Never true    Ran Out of Food in the Last Year: Never true  Transportation Needs: No Transportation Needs (08/29/2020)   PRAPARE -  Hydrologist (Medical): No    Lack of Transportation (Non-Medical): No  Physical Activity: Inactive (08/29/2020)   Exercise Vital Sign    Days of Exercise per Week: 0 days    Minutes of Exercise per Session: 0 min  Stress: No Stress Concern Present (08/29/2020)   [place]    Feeling of Stress : Only a little  Social Connections: Moderately Isolated (08/29/2020)   Social Connection and Isolation Panel [NHANES]    Frequency of Communication with Friends and Family: Once a week    Frequency of Social Gatherings with Friends and Family: Never    Attends Religious Services: 1 to 4 times per year    Active Member of [employer] or Organizations: No    Attends Archivist Meetings: Never    Marital Status: Living with partner  Intimate Partner Violence: Not At Risk (08/29/2020)   Humiliation, Afraid, Rape, and Kick questionnaire    Fear of Current or Ex-Partner: No    Emotionally Abused: No    Physically Abused: No    Sexually Abused: No    Outpatient Medications Prior to Visit  Medication Sig Dispense Refill   acetaminophen (TYLENOL) 500 MG tablet Take 2 tablets (1,000 mg total) by mouth every 6 (six) hours. 30 tablet 0   AIMOVIG 140 MG/ML SOAJ Inject 1 mL into the skin every 30 (thirty) days.     amLODipine (NORVASC) 5 MG tablet TAKE 1 TABLET(5 MG) BY MOUTH DAILY 30 tablet 6   cetirizine (ZYRTEC) 10 MG tablet Take 1 tablet (10 mg total) by mouth daily. 90 tablet 2   clonazePAM (KLONOPIN) 0.5 MG tablet Take 0.5 mg by mouth 2 (two) times daily as needed.     cyclobenzaprine (FLEXERIL) 10 MG tablet Take 1 tablet by mouth 2 (two) times daily as needed.     DULoxetine (CYMBALTA) 60 MG capsule Take 60 mg by mouth 2 (two) times daily.     fluticasone (FLONASE) 50 MCG/ACT nasal spray Place 1 spray into both nostrils 2 (two) times daily as needed for allergies or rhinitis. 16 g 5   HEATHER 0.35 MG tablet  TAKE 1 TABLET(0.35 MG) BY MOUTH DAILY 28 tablet 12   hydrOXYzine (ATARAX) 25 MG tablet Take 25 mg by mouth 3 (three) times daily as needed.     ibuprofen (ADVIL) 200 MG tablet Take 200 mg by mouth every 6 (six) hours as needed.     Iron, Ferrous Sulfate, 325 (65 Fe) MG TABS Take 325 mg by mouth daily. 30 tablet 3   pantoprazole (PROTONIX) 40 MG tablet TAKE 1 TABLET(40 MG) BY MOUTH DAILY 90  tablet 1   promethazine (PHENERGAN) 25 MG tablet Take 1 tablet (25 mg total) by mouth every 8 (eight) hours as needed for nausea or vomiting. 20 tablet 0   Rimegepant Sulfate (NURTEC) 75 MG TBDP Take 1 tablet (75 mg total) by mouth daily as needed (for migraine). 16 tablet 1   rizatriptan (MAXALT) 10 MG tablet Take 1 tablet (10 mg total) by mouth as needed for migraine. May repeat in 2 hours if needed 10 tablet 3   Semaglutide, 2 MG/DOSE, 8 MG/3ML SOPN Inject 2 mg as directed once a week. 3 mL 3   traMADol (ULTRAM) 50 MG tablet Take 100 mg by mouth every 8 (eight) hours as needed.     triamcinolone cream (KENALOG) 0.1 % Apply 1 application topically 2 (two) times daily. 45 g 0   DULoxetine (CYMBALTA) 30 MG capsule Take 1 capsule (30 mg total) by mouth daily. (Patient taking differently: Take 30 mg by mouth 3 (three) times daily.) 30 capsule 3   No facility-administered medications prior to visit.    Allergies  Allergen Reactions   Amoxicillin Hives   Banana Itching    Tongue itching   Motrin [Ibuprofen] Rash    MOTRIN (Brand only) causes this allergy (tolerates ADVIL brand)   Other Itching    Tree nuts cause tongue itching    Pineapple Itching    Tongue itching    ROS Review of Systems  Constitutional:  Negative for chills and fever.  HENT:  Negative for congestion, sinus pressure, sinus pain and sore throat.   Eyes:  Negative for pain and discharge.  Respiratory:  Negative for cough and shortness of breath.   Cardiovascular:  Negative for chest pain and palpitations.  Gastrointestinal:   Negative for diarrhea, nausea and vomiting.  Endocrine: Negative for polydipsia and polyuria.  Genitourinary:  Negative for dysuria and hematuria.  Musculoskeletal:  Positive for arthralgias and back pain. Negative for neck pain and neck stiffness.  Skin:  Negative for rash.  Neurological:  Negative for dizziness, syncope, weakness and numbness.  Psychiatric/Behavioral:  Negative for agitation and behavioral problems.       Objective:    Physical Exam Vitals reviewed.  Constitutional:      General: She is not in acute distress.    Appearance: She is obese. She is not diaphoretic.  HENT:     Head: Normocephalic and atraumatic.     Nose: Nose normal.     Mouth/Throat:     Mouth: Mucous membranes are moist.  Eyes:     General: No scleral icterus.    Extraocular Movements: Extraocular movements intact.  Cardiovascular:     Rate and Rhythm: Normal rate and regular rhythm.     Pulses: Normal pulses.     Heart sounds: Normal heart sounds. No murmur heard. Pulmonary:     Breath sounds: Normal breath sounds. No wheezing or rales.  Abdominal:     Palpations: Abdomen is soft.     Tenderness: There is no abdominal tenderness. There is no right CVA tenderness or left CVA tenderness.  Musculoskeletal:     Cervical back: Neck supple. No tenderness.     Right lower leg: No edema.     Left lower leg: No edema.  Skin:    General: Skin is warm.     Findings: No rash.  Neurological:     General: No focal deficit present.     Mental Status: She is alert and oriented to person, place, and time.  Psychiatric:        Mood and Affect: Mood normal.        Behavior: Behavior normal.     BP 110/63   Pulse 92   Ht 5\' 6"  (1.676 m)   Wt 236 lb (107 kg)   SpO2 98%   BMI 38.09 kg/m  Wt Readings from Last 3 Encounters:  06/23/22 236 lb (107 kg)  02/17/22 235 lb 6.4 oz (106.8 kg)  10/29/21 240 lb 3.2 oz (109 kg)    Lab Results  Component Value Date   TSH 1.950 06/30/2019   Lab Results   Component Value Date   WBC 8.3 10/22/2021   HGB 14.1 10/22/2021   HCT 44.9 10/22/2021   MCV 74 (L) 10/22/2021   PLT 326 01/18/2021   Lab Results  Component Value Date   NA 139 10/22/2021   K 4.2 10/22/2021   CO2 23 10/22/2021   GLUCOSE 94 10/22/2021   BUN 9 10/22/2021   CREATININE 0.69 10/22/2021   BILITOT <0.2 10/22/2021   ALKPHOS 113 10/22/2021   AST 14 10/22/2021   ALT 17 10/22/2021   PROT 7.7 10/22/2021   ALBUMIN 4.5 10/22/2021   CALCIUM 9.4 10/22/2021   ANIONGAP 9 07/22/2020   EGFR 116 10/22/2021   Lab Results  Component Value Date   CHOL 136 01/18/2021   Lab Results  Component Value Date   HDL 35 (L) 01/18/2021   Lab Results  Component Value Date   LDLCALC 88 01/18/2021   Lab Results  Component Value Date   TRIG 65 01/18/2021   Lab Results  Component Value Date   CHOLHDL 3.9 01/18/2021   Lab Results  Component Value Date   HGBA1C 5.6 02/17/2022      Assessment & Plan:   Problem List Items Addressed This Visit    Migraine Was well controlled with Aimovig Recently had breakthrough headaches, added Nurtec, helps for a day Urgent care visit note reviewed If persistent, may need change in regimen Planned to see [facility]  Type 2 diabetes mellitus with other specified complication ([place]) Lab Results  Component Value Date   HGBA1C 5.6 02/17/2022   Well-controlled Associated with HTN and OSA Had added Ozempic for insulin resistance and weight loss benefit -  Increased dose to 2 mg qw Advised to follow diabetic diet F/u CMP and lipid panel Diabetic eye exam: Advised to follow up with Ophthalmology for diabetic eye exam  Essential hypertension BP Readings from Last 1 Encounters:  06/23/22 110/63   Well-controlled with amlodipine 5 mg daily Counseled for compliance with the medications Advised to follow DASH diet and perform moderate exercise/walking at least 150 minutes/week  Encounter for general adult medical examination with  abnormal findings Physical exam as documented. Fasting labs ordered. Advised to follow low-carb diet and focus on weight loss. PAP smear with OB/GYN.  Morbid obesity ([place]) BMI Readings from Last 3 Encounters:  06/23/22 38.09 kg/m  02/17/22 44.48 kg/m  10/29/21 45.39 kg/m   Associated with HTN, DM and OSA Diet modification and moderate exercise advised On Ozempic for type II DM Has lost 22 lbs since starting Ozempic    No orders of the defined types were placed in this encounter.   Follow-up: Return in about 4 months (around 10/23/2022) for DM.    Lindell Spar, MD

## 2022-06-23 NOTE — Patient Instructions (Signed)
Please continue to take medications as prescribed. ? ?Please continue to follow low carb diet and perform moderate exercise/walking at least 150 mins/week. ?

## 2022-06-24 ENCOUNTER — Other Ambulatory Visit: Payer: Self-pay | Admitting: Internal Medicine

## 2022-06-24 DIAGNOSIS — E559 Vitamin D deficiency, unspecified: Secondary | ICD-10-CM | POA: Insufficient documentation

## 2022-06-24 LAB — CMP14+EGFR
ALT: 14 IU/L (ref 0–32)
AST: 16 IU/L (ref 0–40)
Albumin/Globulin Ratio: 1.3 (ref 1.2–2.2)
Albumin: 3.9 g/dL (ref 3.9–4.9)
Alkaline Phosphatase: 104 IU/L (ref 44–121)
BUN/Creatinine Ratio: 20 (ref 9–23)
BUN: 13 mg/dL (ref 6–20)
Bilirubin Total: <0.2 mg/dL (ref 0.0–1.2)
CO2: 23 mmol/L (ref 20–29)
Calcium: 8.7 mg/dL (ref 8.7–10.2)
Chloride: 105 mmol/L (ref 96–106)
Creatinine, Ser: 0.65 mg/dL (ref 0.57–1.00)
Globulin, Total: 2.9 g/dL (ref 1.5–4.5)
Glucose: 77 mg/dL (ref 70–99)
Potassium: 4 mmol/L (ref 3.5–5.2)
Sodium: 139 mmol/L (ref 134–144)
Total Protein: 6.8 g/dL (ref 6.0–8.5)
eGFR: 117 mL/min/{1.73_m2} (ref >59)

## 2022-06-24 LAB — CBC WITH DIFFERENTIAL/PLATELET
Basophils Absolute: 0 10*3/uL (ref 0.0–0.2)
Basos: 1 %
EOS (ABSOLUTE): 0.2 10*3/uL (ref 0.0–0.4)
Eos: 2 %
Hematocrit: 39.8 % (ref 34.0–46.6)
Hemoglobin: 12.3 g/dL (ref 11.1–15.9)
Immature Grans (Abs): 0 10*3/uL (ref 0.0–0.1)
Immature Granulocytes: 0 %
Lymphocytes Absolute: 3.2 10*3/uL — ABNORMAL HIGH (ref 0.7–3.1)
Lymphs: 38 %
MCH: 23.1 pg — ABNORMAL LOW (ref 26.6–33.0)
MCHC: 30.9 g/dL — ABNORMAL LOW (ref 31.5–35.7)
MCV: 75 fL — ABNORMAL LOW (ref 79–97)
Monocytes Absolute: 0.8 10*3/uL (ref 0.1–0.9)
Monocytes: 9 %
Neutrophils Absolute: 4.2 10*3/uL (ref 1.4–7.0)
Neutrophils: 50 %
Platelets: 342 10*3/uL (ref 150–450)
RBC: 5.32 x10E6/uL — ABNORMAL HIGH (ref 3.77–5.28)
RDW: 15.6 % — ABNORMAL HIGH (ref 11.7–15.4)
WBC: 8.5 10*3/uL (ref 3.4–10.8)

## 2022-06-24 LAB — VITAMIN D 25 HYDROXY (VIT D DEFICIENCY, FRACTURES): Vit D, 25-Hydroxy: 6.9 ng/mL — ABNORMAL LOW (ref 30.0–100.0)

## 2022-06-24 LAB — LIPID PANEL
Chol/HDL Ratio: 4.1 ratio (ref 0.0–4.4)
Cholesterol, Total: 139 mg/dL (ref 100–199)
HDL: 34 mg/dL — ABNORMAL LOW (ref >39)
LDL Chol Calc (NIH): 89 mg/dL (ref 0–99)
Triglycerides: 81 mg/dL (ref 0–149)
VLDL Cholesterol Cal: 16 mg/dL (ref 5–40)

## 2022-06-24 LAB — HEMOGLOBIN A1C
Est. average glucose Bld gHb Est-mCnc: 120 mg/dL
Hgb A1c MFr Bld: 5.8 % — ABNORMAL HIGH (ref 4.8–5.6)

## 2022-06-24 LAB — TSH: TSH: 1.66 u[IU]/mL (ref 0.450–4.500)

## 2022-06-24 MED ORDER — VITAMIN D (ERGOCALCIFEROL) 1.25 MG (50000 UNIT) PO CAPS
50000.0000 [IU] | ORAL_CAPSULE | ORAL | 1 refills | Status: DC
Start: 2022-06-24 — End: 2022-12-15

## 2022-07-10 ENCOUNTER — Other Ambulatory Visit: Payer: Self-pay | Admitting: Internal Medicine

## 2022-07-10 ENCOUNTER — Encounter: Payer: Self-pay | Admitting: Internal Medicine

## 2022-07-10 DIAGNOSIS — G43E19 Chronic migraine with aura, intractable, without status migrainosus: Secondary | ICD-10-CM

## 2022-07-10 DIAGNOSIS — M797 Fibromyalgia: Secondary | ICD-10-CM

## 2022-07-10 MED ORDER — TRAMADOL HCL 50 MG PO TABS
50.0000 mg | ORAL_TABLET | Freq: Three times a day (TID) | ORAL | 0 refills | Status: DC | PRN
Start: 2022-07-10 — End: 2023-07-02

## 2022-07-10 MED ORDER — AIMOVIG 140 MG/ML ~~LOC~~ SOAJ
1.0000 mL | SUBCUTANEOUS | 5 refills | Status: DC
Start: 2022-07-10 — End: 2022-08-22

## 2022-07-11 ENCOUNTER — Other Ambulatory Visit: Payer: Self-pay

## 2022-07-11 MED ORDER — DULOXETINE HCL 60 MG PO CPEP: 60.0000 mg | ORAL_CAPSULE | Freq: Two times a day (BID) | ORAL | 0 refills | Status: DC

## 2022-07-17 ENCOUNTER — Ambulatory Visit: Payer: Medicaid Other | Admitting: Neurology

## 2022-07-17 ENCOUNTER — Encounter: Payer: Self-pay | Admitting: Neurology

## 2022-07-17 VITALS — BP 140/81 | HR 84 | Ht 62.0 in | Wt 239.0 lb

## 2022-07-17 DIAGNOSIS — G43709 Chronic migraine without aura, not intractable, without status migrainosus: Secondary | ICD-10-CM | POA: Diagnosis not present

## 2022-07-17 MED ORDER — AJOVY 225 MG/1.5ML ~~LOC~~ SOAJ: 225.0000 mg | SUBCUTANEOUS | 11 refills | Status: DC

## 2022-07-17 MED ORDER — TIZANIDINE HCL 4 MG PO TABS: 4.0000 mg | ORAL_TABLET | Freq: Four times a day (QID) | ORAL | 6 refills | Status: DC | PRN

## 2022-07-17 MED ORDER — RIZATRIPTAN BENZOATE 10 MG PO TBDP: 10.0000 mg | ORAL_TABLET | ORAL | 6 refills | Status: AC | PRN

## 2022-07-17 MED ORDER — ONDANSETRON HCL 4 MG PO TABS: 4.0000 mg | ORAL_TABLET | Freq: Three times a day (TID) | ORAL | 6 refills | Status: DC | PRN

## 2022-07-17 NOTE — Progress Notes (Signed)
Chief Complaint  Patient presents with   New Patient (Initial Visit)    Rm 14, alone Has had daily migraines in the past month.       ASSESSMENT AND PLAN  Theresa Rosario is a 37 y.o. female   Chronic migraine headaches Depression anxiety Obesity Obstructive sleep apnea, could not tolerate CPAP machine,  Did respond somewhat to CGRP antagonist in the past, Ajovy monthly as preventive medication  Suboptimal next to Imitrex, Nurtec, will try Maxalt as needed, may mix with Aleve, tizanidine, Zofran for prolonged severe headaches, avoid frequent NSAIDs and triptan use  Return To Clinic With NP In 4 Months  DIAGNOSTIC DATA (LABS, IMAGING, TESTING) - I reviewed patient records, labs, notes, testing and imaging myself where available.   MEDICAL HISTORY:  Theresa Rosario is a 37 year old female, seen in request by her primary care physician Dr. Allena Katz, Dondra Prader K for evaluation of migraine headache, initial evaluation was on July 17, 2022  I reviewed and summarized the referring note. PMHX HTN Fibromyalgia Anxiety DM Obesity OSA, could not tolerate CPAP machine  She had long history of migraine headache, described migraine as lateralized over the left side's retro-orbital area severe pounding headache, with light noise sensitivity, nauseous, lasting for hours to days,  Over the years, she tried different preventive medications, Topamax, was not sure about the benefit, from 2023 until March 2024, she was given aimovig monthly injection by previous neurologist Dr. Gerilyn Pilgrim, during that period of time, her headache was under best control, only happen few times each year  However since February 2024, she began to have frequent migraine headache almost daily basis, Imitrex Nurtec as needed was not very effective  She has stopped aimovig use since March because Dr. Ronal Fear office has closed off  She does have obstructive sleep apnea, tried CPAP, could not tolerated, also  have obesity, no significant visual loss, no whooshing tinnitus,   PHYSICAL EXAM:   Vitals:   07/17/22 1042  BP: (!) 140/81  Pulse: 84  Weight: 239 lb (108.4 kg)  Height: 5\' 2"  (1.575 m)   Not recorded     Body mass index is 43.71 kg/m.  PHYSICAL EXAMNIATION:  Gen: NAD, conversant, well nourised, well groomed                     Cardiovascular: Regular rate rhythm, no peripheral edema, warm, nontender. Eyes: Conjunctivae clear without exudates or hemorrhage Neck: Supple, no carotid bruits. Pulmonary: Clear to auscultation bilaterally   NEUROLOGICAL EXAM:  MENTAL STATUS: Speech/cognition: Obesity, awake, alert, oriented to history taking and casual conversation CRANIAL NERVES: CN II: Visual fields are full to confrontation. Pupils are round equal and briskly reactive to light.  Funduscopy examination showed sharp disc bilaterally CN III, IV, VI: extraocular movement are normal. No ptosis. CN V: Facial sensation is intact to light touch CN VII: Face is symmetric with normal eye closure  CN VIII: Hearing is normal to causal conversation. CN IX, X: Phonation is normal. CN XI: Head turning and shoulder shrug are intact  MOTOR: There is no pronator drift of out-stretched arms. Muscle bulk and tone are normal. Muscle strength is normal.  REFLEXES: Reflexes are 2+ and symmetric at the biceps, triceps, knees, and ankles. Plantar responses are flexor.  SENSORY: Intact to light touch, pinprick and vibratory sensation are intact in fingers and toes.  COORDINATION: There is no trunk or limb dysmetria noted.  GAIT/STANCE: Posture is normal. Gait is steady with normal steps,  base, arm swing, and turning. Heel and toe walking are normal. Tandem gait is normal.  Romberg is absent.  REVIEW OF SYSTEMS:  Full 14 system review of systems performed and notable only for as above All other review of systems were negative.   ALLERGIES: Allergies  Allergen Reactions    Amoxicillin Hives   Banana Itching    Tongue itching   Motrin [Ibuprofen] Rash    MOTRIN (Brand only) causes this allergy (tolerates ADVIL brand)   Other Itching    Tree nuts cause tongue itching    Pineapple Itching    Tongue itching    HOME MEDICATIONS: Current Outpatient Medications  Medication Sig Dispense Refill   acetaminophen (TYLENOL) 500 MG tablet Take 2 tablets (1,000 mg total) by mouth every 6 (six) hours. 30 tablet 0   AIMOVIG 140 MG/ML SOAJ Inject 140 mg into the skin every 30 (thirty) days. 1 mL 5   amLODipine (NORVASC) 5 MG tablet TAKE 1 TABLET(5 MG) BY MOUTH DAILY 30 tablet 6   cetirizine (ZYRTEC) 10 MG tablet Take 1 tablet (10 mg total) by mouth daily. 90 tablet 2   clonazePAM (KLONOPIN) 0.5 MG tablet Take 0.5 mg by mouth 2 (two) times daily as needed.     cyclobenzaprine (FLEXERIL) 10 MG tablet Take 1 tablet by mouth 2 (two) times daily as needed.     DULoxetine (CYMBALTA) 60 MG capsule Take 1 capsule (60 mg total) by mouth 2 (two) times daily. 60 capsule 0   fluticasone (FLONASE) 50 MCG/ACT nasal spray Place 1 spray into both nostrils 2 (two) times daily as needed for allergies or rhinitis. 16 g 5   HEATHER 0.35 MG tablet TAKE 1 TABLET(0.35 MG) BY MOUTH DAILY 28 tablet 12   hydrOXYzine (ATARAX) 25 MG tablet Take 25 mg by mouth 3 (three) times daily as needed.     ibuprofen (ADVIL) 200 MG tablet Take 200 mg by mouth every 6 (six) hours as needed.     OZEMPIC, 2 MG/DOSE, 8 MG/3ML SOPN INJECT 2 MG UNDER THE SKIN ONE DAY A WEEK 3 mL 3   pantoprazole (PROTONIX) 40 MG tablet TAKE 1 TABLET(40 MG) BY MOUTH DAILY 90 tablet 1   promethazine (PHENERGAN) 25 MG tablet Take 1 tablet (25 mg total) by mouth every 8 (eight) hours as needed for nausea or vomiting. 20 tablet 0   Rimegepant Sulfate (NURTEC) 75 MG TBDP Take 1 tablet (75 mg total) by mouth daily as needed (for migraine). 16 tablet 1   rizatriptan (MAXALT) 10 MG tablet Take 1 tablet (10 mg total) by mouth as needed for  migraine. May repeat in 2 hours if needed 10 tablet 3   traMADol (ULTRAM) 50 MG tablet Take 1 tablet (50 mg total) by mouth every 8 (eight) hours as needed. 30 tablet 0   triamcinolone cream (KENALOG) 0.1 % Apply 1 application topically 2 (two) times daily. 45 g 0   Vitamin D, Ergocalciferol, (DRISDOL) 1.25 MG (50000 UNIT) CAPS capsule Take 1 capsule (50,000 Units total) by mouth every 7 (seven) days. 12 capsule 1   No current facility-administered medications for this visit.    PAST MEDICAL HISTORY: Past Medical History:  Diagnosis Date   Abnormal chromosomal and genetic finding on antenatal screening mother 02/06/2020   On Horizon Nov 2021     Silent carrier for alpha-thalassemia (FOB____)  and increased carrier risk for SMA  (FOB____)   Anemia 12/30/2018   Blood in urine 10/18/2015   Condyloma of female  genitalia 10/21/2012   Subclitoral, TCA 7/31    Depression    Diabetes mellitus without complication    Fibromyalgia    Hx of hematuria    Hx of ovarian cyst    Hypertension    Irregular bleeding 12/02/2017   Kidney stone    History of kidney stones   Large breasts    Lumbago 11/11/2011   Migraine     PAST SURGICAL HISTORY: Past Surgical History:  Procedure Laterality Date   DENTAL SURGERY     WISDOM TOOTH EXTRACTION Bilateral     FAMILY HISTORY: Family History  Problem Relation Age of Onset   Diabetes Paternal Grandmother    Hypertension Paternal Grandmother    Diabetes Maternal Grandmother    Heart disease Maternal Grandfather    Hypertension Father    Diabetes Mother    Hypertension Mother    Parkinson's disease Mother    Colon cancer Mother 72   Coronary artery disease Other    Diabetes Other     SOCIAL HISTORY: Social History   Socioeconomic History   Marital status: Significant Other    Spouse name: Not on file   Number of children: 2   Years of education: college   Highest education level: Master's degree (e.g., MA, MS, MEng, MEd, MSW, MBA)   Occupational History   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 Right-handed. 2-3 cups caffeine per day.   Social Determinants of Health   Financial Resource Strain: Low Risk  (08/29/2020)   Overall Financial Resource Strain (CARDIA)    Difficulty of Paying Living Expenses: Not very hard  Food Insecurity: No Food Insecurity (08/29/2020)   Hunger Vital Sign    Worried About Running Out of Food in the Last Year: Never true    Ran Out of Food in the Last Year: Never true  Transportation Needs: No Transportation Needs (08/29/2020)   PRAPARE - Administrator, Civil Service (Medical): No    Lack of Transportation (Non-Medical): No  Physical Activity: Inactive (08/29/2020)   Exercise Vital Sign    Days of Exercise per Week: 0 days    Minutes of Exercise per Session: 0 min  Stress: No Stress Concern Present (08/29/2020)   [employer] of Occupational Health - Occupational Stress Questionnaire    Feeling of Stress : Only a little  Social Connections: Moderately Isolated (08/29/2020)   Social Connection and Isolation Panel [NHANES]    Frequency of Communication with Friends and Family: Once a week    Frequency of Social Gatherings with Friends and Family: Never    Attends Religious Services: 1 to 4 times per year    Active Member of [employer] or Organizations: No    Attends Banker Meetings: Never    Marital Status: Living with partner  Intimate  Partner Violence: Not At Risk (  08/29/2020)   Humiliation, Afraid, Rape, and Kick questionnaire    Fear of Current or Ex-Partner: No    Emotionally Abused: No    Physically Abused: No    Sexually Abused: No      Levert Feinstein, M.D. Ph.D.  [facility] [address] [place], [place] [address] Ph: [phone] Fax: [phone]  CC:  [name], MD [address] [place],  [place] [address]  [name], MD

## 2022-07-17 NOTE — Patient Instructions (Signed)
Meds ordered this encounter  Medications   Fremanezumab-vfrm (AJOVY) 225 MG/1.5ML SOAJ---monthly as migraine prevention    Sig: Inject 225 mg into the skin every 30 (thirty) days.    Dispense:  1.68 mL    Refill:  11  1 rizatriptan (MAXALT-MLT) 10 MG disintegrating tablet    Sig: Take 1 tablet (10 mg total) by mouth as needed. May repeat in 2 hours if needed    Dispense:  12 tablet    Refill:  6  2 ondansetron (ZOFRAN) 4 MG tablet---nauseous    Sig: Take 1 tablet (4 mg total) by mouth every 8 (eight) hours as needed for nausea or vomiting.    Dispense:  20 tablet    Refill:  6  3 tiZANidine (ZANAFLEX) 4 MG tablet--muscle relaxant    Sig: Take 1 tablet (4 mg total) by mouth every 6 (six) hours as needed.    Dispense:  30 tablet    Refill:  6     For prolonged severe headache, 1, 2, 3, +aleve as needed, sleep

## 2022-07-18 ENCOUNTER — Encounter: Payer: Self-pay | Admitting: Neurology

## 2022-07-20 ENCOUNTER — Other Ambulatory Visit: Payer: Self-pay | Admitting: Adult Health

## 2022-07-21 ENCOUNTER — Telehealth: Payer: Self-pay

## 2022-07-21 NOTE — Telephone Encounter (Signed)
Please obtain a PA for Ajovy 225 MG/ 1.5 mL. Thank you!

## 2022-07-24 ENCOUNTER — Telehealth: Payer: Self-pay | Admitting: Pharmacy Technician

## 2022-07-24 NOTE — Telephone Encounter (Signed)
Patient Advocate Encounter  Prior Authorization for AJOVY (fremanezumab-vfrm) injection 225MG /1.5ML auto-injectors has been approved.    PA# [id number] Insurance [employer] [place] Form Effective dates: 07/24/2022 through 10/22/2022

## 2022-07-24 NOTE — Telephone Encounter (Signed)
Status of PA in separate encounter 

## 2022-07-25 ENCOUNTER — Encounter: Payer: Self-pay | Admitting: Internal Medicine

## 2022-08-10 ENCOUNTER — Other Ambulatory Visit: Payer: Self-pay | Admitting: Internal Medicine

## 2022-08-10 DIAGNOSIS — K219 Gastro-esophageal reflux disease without esophagitis: Secondary | ICD-10-CM

## 2022-08-20 ENCOUNTER — Other Ambulatory Visit: Payer: Self-pay | Admitting: Allergy & Immunology

## 2022-08-22 ENCOUNTER — Ambulatory Visit (INDEPENDENT_AMBULATORY_CARE_PROVIDER_SITE_OTHER): Payer: Medicaid Other

## 2022-08-22 ENCOUNTER — Ambulatory Visit
Admission: EM | Admit: 2022-08-22 | Discharge: 2022-08-22 | Disposition: A | Discharge status: Home / Self Care | Payer: Medicaid Other | Attending: Urgent Care | Admitting: Urgent Care

## 2022-08-22 ENCOUNTER — Encounter: Payer: Self-pay | Admitting: Emergency Medicine

## 2022-08-22 DIAGNOSIS — B9789 Other viral agents as the cause of diseases classified elsewhere: Secondary | ICD-10-CM | POA: Diagnosis not present

## 2022-08-22 DIAGNOSIS — J988 Other specified respiratory disorders: Secondary | ICD-10-CM | POA: Diagnosis not present

## 2022-08-22 DIAGNOSIS — R059 Cough, unspecified: Secondary | ICD-10-CM | POA: Diagnosis not present

## 2022-08-22 LAB — POCT URINALYSIS DIP (MANUAL ENTRY)
Bilirubin, UA: NEGATIVE
Glucose, UA: NEGATIVE mg/dL
Ketones, POC UA: NEGATIVE mg/dL
Leukocytes, UA: NEGATIVE
Nitrite, UA: NEGATIVE
Protein Ur, POC: NEGATIVE mg/dL
Spec Grav, UA: >=1.03 — AB (ref 1.010–1.025)
Urobilinogen, UA: 1 E.U./dL — AB
pH, UA: 7 (ref 5.0–8.0)

## 2022-08-22 MED ORDER — PROMETHAZINE-DM 6.25-15 MG/5ML PO SYRP: 5.0000 mL | ORAL_SOLUTION | Freq: Three times a day (TID) | ORAL | 0 refills | Status: DC | PRN

## 2022-08-22 MED ORDER — CETIRIZINE HCL 10 MG PO TABS: 10.0000 mg | ORAL_TABLET | Freq: Every day | ORAL | 0 refills | Status: DC

## 2022-08-22 MED ORDER — PSEUDOEPHEDRINE HCL 60 MG PO TABS: 60.0000 mg | ORAL_TABLET | Freq: Three times a day (TID) | ORAL | 0 refills | Status: DC | PRN

## 2022-08-22 NOTE — ED Triage Notes (Signed)
Cough, sneezing, congestion x 1 week.  States back hurts and pain worse on right side of body.

## 2022-08-22 NOTE — ED Provider Notes (Signed)
[facility]  Note:  This document was prepared using Conservation officer, historic buildings and may include unintentional dictation errors.  MRN: [id number] DOB: 11/09/1985  Subjective:   Theresa Rosario is a 37 y.o. female presenting for 6 day history of acute onset persistent productive cough, sneezing, sinus congestion.  Patient endorses right-sided low back pain, right-sided thoracic pain.  No overt fever, chest pain, shortness of breath.  No history of asthma. No smoking of any kind including cigarettes, cigars, vaping, marijuana use.  Has type 2 diabetes treated without insulin.  Last A1c was less than 6% in April of this year.  No current facility-administered medications for this encounter.  Current Outpatient Medications:    acetaminophen (TYLENOL) 500 MG tablet, Take 2 tablets (1,000 mg total) by mouth every 6 (six) hours., Disp: 30 tablet, Rfl: 0   amLODipine (NORVASC) 5 MG tablet, TAKE 1 TABLET(5 MG) BY MOUTH DAILY, Disp: 30 tablet, Rfl: 6   clonazePAM (KLONOPIN) 0.5 MG tablet, Take 0.5 mg by mouth 2 (two) times daily as needed., Disp: , Rfl:    DULoxetine (CYMBALTA) 60 MG capsule, Take 1 capsule (60 mg total) by mouth 2 (two) times daily., Disp: 60 capsule, Rfl: 0   HEATHER 0.35 MG tablet, TAKE 1 TABLET(0.35 MG) BY MOUTH DAILY, Disp: 28 tablet, Rfl: 12   hydrOXYzine (ATARAX) 25 MG tablet, Take 25 mg by mouth 3 (three) times daily as needed., Disp: , Rfl:    ibuprofen (ADVIL) 200 MG tablet, Take 200 mg by mouth every 6 (six) hours as needed., Disp: , Rfl:    OZEMPIC, 2 MG/DOSE, 8 MG/3ML SOPN, INJECT 2 MG UNDER THE SKIN ONE DAY A WEEK, Disp: 3 mL, Rfl: 3   pantoprazole (PROTONIX) 40 MG tablet, TAKE 1 TABLET(40 MG) BY MOUTH DAILY, Disp: 90 tablet, Rfl: 1   promethazine (PHENERGAN) 25 MG tablet, Take 1 tablet (25 mg total) by mouth every 8 (eight) hours as needed for nausea or vomiting., Disp: 20 tablet, Rfl: 0   Rimegepant Sulfate (NURTEC) 75 MG TBDP, Take 1  tablet (75 mg total) by mouth daily as needed (for migraine)., Disp: 16 tablet, Rfl: 1   rizatriptan (MAXALT) 10 MG tablet, Take 1 tablet (10 mg total) by mouth as needed for migraine. May repeat in 2 hours if needed, Disp: 10 tablet, Rfl: 3   rizatriptan (MAXALT-MLT) 10 MG disintegrating tablet, Take 1 tablet (10 mg total) by mouth as needed. May repeat in 2 hours if needed, Disp: 12 tablet, Rfl: 6   tiZANidine (ZANAFLEX) 4 MG tablet, Take 1 tablet (4 mg total) by mouth every 6 (six) hours as needed., Disp: 30 tablet, Rfl: 6   traMADol (ULTRAM) 50 MG tablet, Take 1 tablet (50 mg total) by mouth every 8 (eight) hours as needed., Disp: 30 tablet, Rfl: 0   Vitamin D, Ergocalciferol, (DRISDOL) 1.25 MG (50000 UNIT) CAPS capsule, Take 1 capsule (50,000 Units total) by mouth every 7 (seven) days., Disp: 12 capsule, Rfl: 1   Allergies  Allergen Reactions   Amoxicillin Hives   Banana Itching    Tongue itching   Motrin [Ibuprofen] Rash    MOTRIN (Brand only) causes this allergy (tolerates ADVIL brand)   Other Itching    Tree nuts cause tongue itching    Pineapple Itching    Tongue itching    Past Medical History:  Diagnosis Date   Abnormal chromosomal and genetic finding on antenatal screening mother 02/06/2020   On Horizon Nov 2021  Silent carrier for alpha-thalassemia (FOB____)  and increased carrier risk for SMA  (FOB____)   Anemia 12/30/2018   Blood in urine 10/18/2015   Condyloma of female genitalia 10/21/2012   Subclitoral, TCA 7/31    Depression    Diabetes mellitus without complication (HCC)    Fibromyalgia    Hx of hematuria    Hx of ovarian cyst    Hypertension    Irregular bleeding 12/02/2017   Kidney stone    History of kidney stones   Large breasts    Lumbago 11/11/2011   Migraine      Past Surgical History:  Procedure Laterality Date   DENTAL SURGERY     WISDOM TOOTH EXTRACTION Bilateral     Family History  Problem Relation Age of Onset   Diabetes Paternal  Grandmother    Hypertension Paternal Grandmother    Diabetes Maternal Grandmother    Heart disease Maternal Grandfather    Hypertension Father    Diabetes Mother    Hypertension Mother    Parkinson's disease Mother    Colon cancer Mother 31   Coronary artery disease Other    Diabetes Other     Social History   Tobacco Use   Smoking status: Former    Types: Cigarettes    Quit date: 09/22/2019    Years since quitting: 2.9   Smokeless tobacco: Never   Tobacco comments:    occasional vape  Vaping Use   Vaping Use: Former  Substance Use Topics   Alcohol use: Not Currently    Comment: socially   Drug use: No    ROS   Objective:   Vitals: BP (!) 142/89 (BP Location: Right Arm)   Pulse 84   Temp 98.4 F (36.9 C) (Oral)   Resp 18   SpO2 98%   Physical Exam Constitutional:      General: She is not in acute distress.    Appearance: Normal appearance. She is well-developed and normal weight. She is not ill-appearing, toxic-appearing or diaphoretic.  HENT:     Head: Normocephalic and atraumatic.     Right Ear: Tympanic membrane, ear canal and external ear normal. No drainage or tenderness. No middle ear effusion. There is no impacted cerumen. Tympanic membrane is not erythematous or bulging.     Left Ear: Tympanic membrane, ear canal and external ear normal. No drainage or tenderness.  No middle ear effusion. There is no impacted cerumen. Tympanic membrane is not erythematous or bulging.     Nose: Nose normal. No congestion or rhinorrhea.     Mouth/Throat:     Mouth: Mucous membranes are moist. No oral lesions.     Pharynx: No pharyngeal swelling, oropharyngeal exudate, posterior oropharyngeal erythema or uvula swelling.     Tonsils: No tonsillar exudate or tonsillar abscesses.  Eyes:     General: No scleral icterus.       Right eye: No discharge.        Left eye: No discharge.     Extraocular Movements: Extraocular movements intact.     Right eye: Normal extraocular  motion.     Left eye: Normal extraocular motion.     Conjunctiva/sclera: Conjunctivae normal.  Cardiovascular:     Rate and Rhythm: Normal rate and regular rhythm.     Heart sounds: Normal heart sounds. No murmur heard.    No friction rub. No gallop.  Pulmonary:     Effort: Pulmonary effort is normal. No respiratory distress.     Breath sounds:  No stridor. No wheezing, rhonchi or rales.  Chest:     Chest wall: No tenderness.  Musculoskeletal:     Cervical back: Normal range of motion and neck supple.  Lymphadenopathy:     Cervical: No cervical adenopathy.  Skin:    General: Skin is warm and dry.  Neurological:     General: No focal deficit present.     Mental Status: She is alert and oriented to person, place, and time.  Psychiatric:        Mood and Affect: Mood normal.        Behavior: Behavior normal.     Results for orders placed or performed during the hospital encounter of 08/22/22 (from the past 24 hour(s))  POCT urinalysis dipstick     Status: Abnormal   Collection Time: 08/22/22  9:43 AM  Result Value Ref Range   Color, UA yellow yellow   Clarity, UA clear clear   Glucose, UA negative negative mg/dL   Bilirubin, UA negative negative   Ketones, POC UA negative negative mg/dL   Spec Grav, UA >=1.610 (A) 1.010 - 1.025   Blood, UA small (A) negative   pH, UA 7.0 5.0 - 8.0   Protein Ur, POC negative negative mg/dL   Urobilinogen, UA 1.0 (A) 0.2 or 1.0 E.U./dL   Nitrite, UA Negative Negative   Leukocytes, UA Negative Negative   DG Chest 2 View  Result Date: 08/22/2022 CLINICAL DATA:  Cough and congestion for 1 week EXAM: CHEST - 2 VIEW COMPARISON:  Chest x-ray February 26, 2019 FINDINGS: The cardiomediastinal silhouette is unchanged in contour. No focal pulmonary opacity. No pleural effusion or pneumothorax. The visualized upper abdomen is unremarkable. No acute osseous abnormality. IMPRESSION: No active cardiopulmonary disease. Electronically Signed   By: Jacob Moores M.D.   On: 08/22/2022 10:15     Assessment and Plan :   PDMP not reviewed this encounter.  1. Viral respiratory illness    Deferred COVID test given timeline of her illness. Chest x-ray negative. Suspect viral URI, viral syndrome. Physical exam findings reassuring and vital signs stable for discharge. Advised supportive care, offered symptomatic relief. Counseled patient on potential for adverse effects with medications prescribed/recommended today, ER and return-to-clinic precautions discussed, patient verbalized understanding.     Wallis Bamberg, PA-C 08/22/22 1032

## 2022-08-22 NOTE — Discharge Instructions (Signed)
We will manage this as a viral illness. For sore throat or cough try using a honey-based tea. Use 3 teaspoons of honey with juice squeezed from half lemon. Place shaved pieces of ginger into 1/2-1 cup of water and warm over stove top. Then mix the ingredients and repeat every 4 hours as needed. Please take ibuprofen 600mg every 6 hours with food alternating with OR taken together with Tylenol 650mg every 6 hours for throat pain, fevers, aches and pains. Hydrate very well with at least 2 liters of water. Eat light meals such as soups (chicken and noodles, vegetable, chicken and wild rice).  Do not eat foods that you are allergic to.  Taking an antihistamine like Zyrtec (10mg daily) can help against postnasal drainage, sinus congestion which can cause sinus pain, sinus headaches, throat pain, painful swallowing, coughing.  You can take this together with pseudoephedrine (Sudafed) at a dose of 60 mg 3 times a day or twice daily as needed for the same kind of nasal drip, congestion.  Use cough medication as needed.  

## 2022-09-16 ENCOUNTER — Encounter: Payer: Self-pay | Admitting: Internal Medicine

## 2022-09-24 ENCOUNTER — Other Ambulatory Visit: Payer: Self-pay

## 2022-09-24 MED ORDER — CETIRIZINE HCL 10 MG PO TABS: 10.0000 mg | ORAL_TABLET | Freq: Every day | ORAL | 0 refills | Status: DC

## 2022-09-26 ENCOUNTER — Other Ambulatory Visit (HOSPITAL_COMMUNITY): Payer: Self-pay

## 2022-10-24 ENCOUNTER — Other Ambulatory Visit: Payer: Self-pay | Admitting: Internal Medicine

## 2022-10-24 DIAGNOSIS — E1169 Type 2 diabetes mellitus with other specified complication: Secondary | ICD-10-CM

## 2022-10-27 ENCOUNTER — Encounter: Payer: Self-pay | Admitting: Internal Medicine

## 2022-10-27 ENCOUNTER — Ambulatory Visit: Payer: Medicaid Other | Admitting: Internal Medicine

## 2022-10-27 VITALS — BP 123/83 | HR 79 | Ht 62.0 in | Wt 225.6 lb

## 2022-10-27 DIAGNOSIS — G43E19 Chronic migraine with aura, intractable, without status migrainosus: Secondary | ICD-10-CM

## 2022-10-27 DIAGNOSIS — I10 Essential (primary) hypertension: Secondary | ICD-10-CM

## 2022-10-27 DIAGNOSIS — L249 Irritant contact dermatitis, unspecified cause: Secondary | ICD-10-CM

## 2022-10-27 DIAGNOSIS — E1169 Type 2 diabetes mellitus with other specified complication: Secondary | ICD-10-CM

## 2022-10-27 DIAGNOSIS — M797 Fibromyalgia: Secondary | ICD-10-CM

## 2022-10-27 DIAGNOSIS — K625 Hemorrhage of anus and rectum: Secondary | ICD-10-CM

## 2022-10-27 LAB — POCT GLYCOSYLATED HEMOGLOBIN (HGB A1C): HbA1c, POC (controlled diabetic range): 5.6 % (ref 0.0–7.0)

## 2022-10-27 MED ORDER — CETIRIZINE HCL 10 MG PO TABS
10.0000 mg | ORAL_TABLET | Freq: Every day | ORAL | 5 refills | Status: DC
Start: 2022-10-27 — End: 2023-07-10

## 2022-10-27 MED ORDER — DULOXETINE HCL 60 MG PO CPEP
60.0000 mg | ORAL_CAPSULE | Freq: Every day | ORAL | 3 refills | Status: DC
Start: 2022-10-27 — End: 2023-06-15

## 2022-10-27 MED ORDER — AMITRIPTYLINE HCL 10 MG PO TABS
10.0000 mg | ORAL_TABLET | Freq: Every day | ORAL | 3 refills | Status: DC
Start: 2022-10-27 — End: 2023-02-26

## 2022-10-27 NOTE — Assessment & Plan Note (Addendum)
Was well controlled with Aimovig Recently had breakthrough headaches, has Nurtec, helps for a day Followed by neurology

## 2022-10-27 NOTE — Assessment & Plan Note (Signed)
Lab Results  Component Value Date   HGBA1C 5.8 (H) 06/23/2022   Well-controlled Associated with HTN and OSA Had added Ozempic for insulin resistance and weight loss benefit - 2 mg qw Advised to follow diabetic diet F/u CMP and lipid panel Diabetic eye exam: Advised to follow up with Ophthalmology for diabetic eye exam

## 2022-10-27 NOTE — Progress Notes (Signed)
Established Patient Office Visit  Subjective:  Patient ID: Theresa Rosario, female    DOB: 20-Aug-1985  Age: 37 y.o. MRN: [id number]  CC:  Chief Complaint  Patient presents with   Diabetes    Four month follow up    fibermyalgia    Flare up    Rectal Bleeding    Patient is concerned for bleeding when having bowel movements    HPI Theresa Rosario is a 37 y.o. female with past medical history of hypertension, type 2 DM, fibromyalgia, insomnia, OSA and morbid obesity who presents for f/u of her chronic medical conditions.  HTN: BP is well-controlled. Takes medications regularly. Patient denies headache, dizziness, chest pain, dyspnea or palpitations.   Type II DM: Her HbA1C is 5.6 now.  She has started taking Ozempic and has been tolerating it well.  She has lost 11 lbs since the last visit, and has lost about 32 lbs since starting Ozempic.  She has been trying to follow low-carb diet.  She has chronic fatigue, but denies any polyuria or polyphagia currently.  Fibromyalgia: She is currently taking tizanidine 4 mg nightly and Cymbalta 90 mg daily.  She takes tramadol for acute flareups, and needs it only on few days in a month.  She was followed by Jorge Mandril at [facility] neurology.  She has seen neurology for migraine.  She has clonazepam PRN for anxiety.  She has tried BuSpar in the past.  She has hair pulling episodes during anxiety spells.  Denies any SI or HI currently.  She reports episodes of rectal bleeding, sporadically after straining.  She has noticed bright red blood. Denies any rectal pain currently.  She has chronic constipation, but denies any melena.  Past Medical History:  Diagnosis Date   Abnormal chromosomal and genetic finding on antenatal screening mother 02/06/2020   On Horizon Nov 2021     Silent carrier for alpha-thalassemia (FOB____)  and increased carrier risk for SMA  (FOB____)   Anemia 12/30/2018   Blood in urine 10/18/2015   Condyloma of female  genitalia 10/21/2012   Subclitoral, TCA 7/31    Depression    Diabetes mellitus without complication (HCC)    Fibromyalgia    Hx of hematuria    Hx of ovarian cyst    Hypertension    Irregular bleeding 12/02/2017   Kidney stone    History of kidney stones   Large breasts    Lumbago 11/11/2011   Migraine     Past Surgical History:  Procedure Laterality Date   DENTAL SURGERY     WISDOM TOOTH EXTRACTION Bilateral     Family History  Problem Relation Age of Onset   Diabetes Paternal Grandmother    Hypertension Paternal Grandmother    Diabetes Maternal Grandmother    Heart disease Maternal Grandfather    Hypertension Father    Diabetes Mother    Hypertension Mother    Parkinson's disease Mother    Colon cancer Mother 57   Coronary artery disease Other    Diabetes Other     Social History   Socioeconomic History   Marital status: Significant Other    Spouse name: Not on file   Number of children: 2   Years of education: college   Highest education level: Master's degree (e.g., MA, MS, MEng, MEd, MSW, MBA)  Occupational History   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 Right-handed. 2-3 cups caffeine per day.   Social Determinants of Health   Financial Resource Strain: Low Risk  (08/29/2020)   Overall Financial Resource Strain (CARDIA)    Difficulty of Paying Living Expenses: Not very hard  Food Insecurity: No Food  Insecurity (08/29/2020)   Hunger Vital Sign    Worried About Running Out of Food in the Last Year: Never true    Ran Out of Food in the Last Year: Never true  Transportation Needs: No Transportation Needs (08/29/2020)   PRAPARE - Administrator, Civil Service (Medical): No    Lack of Transportation (Non-Medical): No  Physical Activity: Inactive (08/29/2020)   Exercise Vital Sign    Days of Exercise per Week: 0 days    Minutes of Exercise per Session: 0 min  Stress: No Stress Concern Present (08/29/2020)   [employer] of Occupational Health - Occupational Stress Questionnaire    Feeling of Stress : Only a little  Social Connections: Moderately Isolated (08/29/2020)   Social Connection and Isolation Panel [NHANES]    Frequency of Communication with Friends and Family: Once a week    Frequency of Social Gatherings with Friends and Family: Never    Attends Religious Services: 1 to 4 times per year    Active Member of [employer] or Organizations: No    Attends Banker Meetings: Never    Marital Status: Living with partner  Intimate Partner Violence: Not At Risk (08/29/2020)   Humiliation, Afraid, Rape, and Kick questionnaire    Fear of Current or Ex-Partner: No    Emotionally Abused: No    Physically Abused: No    Sexually Abused: No    Outpatient Medications Prior to Visit  Medication Sig Dispense Refill   acetaminophen (TYLENOL) 500 MG tablet Take 2 tablets (1,000 mg total) by mouth every 6 (six) hours. 30 tablet 0   amLODipine (NORVASC) 5 MG tablet TAKE 1 TABLET(5 MG) BY MOUTH DAILY 30 tablet 6   clonazePAM (KLONOPIN) 0.5 MG tablet Take 0.5 mg by mouth 2 (two) times daily as needed.     HEATHER 0.35 MG tablet TAKE 1 TABLET(0.35 MG) BY MOUTH DAILY 28 tablet 12   ibuprofen (ADVIL) 200 MG tablet Take 200 mg by mouth every 6 (six) hours as needed.     OZEMPIC, 2 MG/DOSE, 8 MG/3ML SOPN INJECT 2 MG UNDER THE SKIN ONCE A WEEK 3 mL 3   pantoprazole (PROTONIX) 40 MG tablet  TAKE 1 TABLET(40 MG) BY MOUTH DAILY 90 tablet 1   Rimegepant Sulfate (NURTEC) 75 MG TBDP Take 1 tablet (75 mg total) by mouth daily as needed (for migraine). 16 tablet 1   rizatriptan (MAXALT-MLT) 10 MG disintegrating tablet Take 1 tablet (10 mg total) by mouth as needed. May repeat in 2 hours if needed 12 tablet 6   tiZANidine (ZANAFLEX) 4 MG tablet Take 1 tablet (4 mg total)  by mouth every 6 (six) hours as needed. 30 tablet 6   traMADol (ULTRAM) 50 MG tablet Take 1 tablet (50 mg total) by mouth every 8 (eight) hours as needed. 30 tablet 0   Vitamin D, Ergocalciferol, (DRISDOL) 1.25 MG (50000 UNIT) CAPS capsule Take 1 capsule (50,000 Units total) by mouth every 7 (seven) days. 12 capsule 1   cetirizine (ZYRTEC) 10 MG tablet TAKE 1 TABLET(10 MG) BY MOUTH DAILY 30 tablet 0   DULoxetine (CYMBALTA) 60 MG capsule Take 1 capsule (60 mg total) by mouth 2 (two) times daily. 60 capsule 0   hydrOXYzine (ATARAX) 25 MG tablet Take 25 mg by mouth 3 (three) times daily as needed.     promethazine (PHENERGAN) 25 MG tablet Take 1 tablet (25 mg total) by mouth every 8 (eight) hours as needed for nausea or vomiting. 20 tablet 0   promethazine-dextromethorphan (PROMETHAZINE-DM) 6.25-15 MG/5ML syrup Take 5 mLs by mouth 3 (three) times daily as needed for cough. 200 mL 0   pseudoephedrine (SUDAFED) 60 MG tablet Take 1 tablet (60 mg total) by mouth every 8 (eight) hours as needed for congestion. 30 tablet 0   rizatriptan (MAXALT) 10 MG tablet Take 1 tablet (10 mg total) by mouth as needed for migraine. May repeat in 2 hours if needed 10 tablet 3   No facility-administered medications prior to visit.    Allergies  Allergen Reactions   Amoxicillin Hives   Banana Itching    Tongue itching   Motrin [Ibuprofen] Rash    MOTRIN (Brand only) causes this allergy (tolerates ADVIL brand)   Other Itching    Tree nuts cause tongue itching    Pineapple Itching    Tongue itching    ROS Review of Systems   Constitutional:  Negative for chills and fever.  HENT:  Negative for sinus pressure, sinus pain and sore throat.   Eyes:  Negative for pain and discharge.  Respiratory:  Negative for cough and shortness of breath.   Cardiovascular:  Negative for chest pain and palpitations.  Gastrointestinal:  Positive for anal bleeding and constipation. Negative for diarrhea, nausea and vomiting.  Endocrine: Negative for polydipsia and polyuria.  Genitourinary:  Negative for dysuria and hematuria.  Musculoskeletal:  Positive for arthralgias, back pain and myalgias. Negative for neck pain and neck stiffness.  Skin:  Negative for rash.  Neurological:  Positive for headaches. Negative for dizziness, syncope, weakness and numbness.  Psychiatric/Behavioral:  Negative for agitation and behavioral problems.       Objective:    Physical Exam Vitals reviewed.  Constitutional:      General: She is not in acute distress.    Appearance: She is obese. She is not diaphoretic.  HENT:     Head: Normocephalic and atraumatic.     Nose: Nose normal.     Mouth/Throat:     Mouth: Mucous membranes are moist.  Eyes:     General: No scleral icterus.    Extraocular Movements: Extraocular movements intact.  Cardiovascular:     Rate and Rhythm: Normal rate and regular rhythm.     Pulses: Normal pulses.     Heart sounds: Normal heart sounds. No murmur heard. Pulmonary:     Breath sounds: Normal breath sounds. No wheezing or rales.  Musculoskeletal:     Cervical back: Neck supple. No tenderness.     Right lower leg: No edema.     Left lower leg: No edema.  Skin:    General: Skin is warm.  Findings: Erythema (Intermittent, over b/l UE and LE) present. No rash.  Neurological:     General: No focal deficit present.     Mental Status: She is alert and oriented to person, place, and time.  Psychiatric:        Mood and Affect: Mood normal.        Behavior: Behavior normal.     BP 123/83 (BP Location: Right  Arm, Patient Position: Sitting, Cuff Size: Large)   Pulse 79   Ht 5\' 2"  (1.575 m)   Wt 225 lb 9.6 oz (102.3 kg)   SpO2 98%   BMI 41.26 kg/m  Wt Readings from Last 3 Encounters:  10/27/22 225 lb 9.6 oz (102.3 kg)  07/17/22 239 lb (108.4 kg)  06/23/22 236 lb (107 kg)    Lab Results  Component Value Date   TSH 1.660 06/23/2022   Lab Results  Component Value Date   WBC 8.5 06/23/2022   HGB 12.3 06/23/2022   HCT 39.8 06/23/2022   MCV 75 (L) 06/23/2022   PLT 342 06/23/2022   Lab Results  Component Value Date   NA 139 06/23/2022   K 4.0 06/23/2022   CO2 23 06/23/2022   GLUCOSE 77 06/23/2022   BUN 13 06/23/2022   CREATININE 0.65 06/23/2022   BILITOT <0.2 06/23/2022   ALKPHOS 104 06/23/2022   AST 16 06/23/2022   ALT 14 06/23/2022   PROT 6.8 06/23/2022   ALBUMIN 3.9 06/23/2022   CALCIUM 8.7 06/23/2022   ANIONGAP 9 07/22/2020   EGFR 117 06/23/2022   Lab Results  Component Value Date   CHOL 139 06/23/2022   Lab Results  Component Value Date   HDL 34 (L) 06/23/2022   Lab Results  Component Value Date   LDLCALC 89 06/23/2022   Lab Results  Component Value Date   TRIG 81 06/23/2022   Lab Results  Component Value Date   CHOLHDL 4.1 06/23/2022   Lab Results  Component Value Date   HGBA1C 5.6 10/27/2022      Assessment & Plan:   Problem List Items Addressed This Visit       Cardiovascular and Mediastinum   Migraine    Was well controlled with Aimovig Recently had breakthrough headaches, has Nurtec, helps for a day Followed by neurology      Relevant Medications   DULoxetine (CYMBALTA) 60 MG capsule   amitriptyline (ELAVIL) 10 MG tablet   Essential hypertension    BP Readings from Last 1 Encounters:  10/27/22 123/83   Well-controlled with amlodipine 5 mg daily Counseled for compliance with the medications Advised to follow DASH diet and perform moderate exercise/walking at least 150 minutes/week        Digestive   Rectal bleeding    Likely  due to internal hemorrhoids Since she has sporadic episodes, we will focus on treating constipation - advised to take Colace as needed If recurrent episodes, will need GI evaluation      Relevant Orders   CBC with Differential/Platelet     Endocrine   Type 2 diabetes mellitus with other specified complication (HCC) - Primary    Lab Results  Component Value Date   HGBA1C 5.8 (H) 06/23/2022   Well-controlled Associated with HTN and OSA Had added Ozempic for insulin resistance and weight loss benefit - 2 mg qw Advised to follow diabetic diet F/u CMP and lipid panel Diabetic eye exam: Advised to follow up with Ophthalmology for diabetic eye exam      Relevant  Orders   CMP14+EGFR   Hemoglobin A1c   Urine Microalbumin w/creat. ratio (Completed)   Ambulatory referral to Optometry   POCT glycosylated hemoglobin (Hb A1C) (Completed)     Musculoskeletal and Integument   Irritant contact dermatitis    Diffuse, appears to be allergic in etiology, unknown trigger Kenalog cream prescribed Had referred to Allergy clinic      Relevant Medications   cetirizine (ZYRTEC) 10 MG tablet     Other   Fibromyalgia    On Cymbalta 90 mg QD and tramadol 50 mg PRN, will refill for now Added Elavil 10 mg nightly Takes Klonopin for anxiety Used to follow up with neurology-Ayeshia Lowell Guitar, NP      Relevant Medications   DULoxetine (CYMBALTA) 60 MG capsule   amitriptyline (ELAVIL) 10 MG tablet   Other Relevant Orders   CBC with Differential/Platelet   CMP14+EGFR   Morbid obesity (HCC)    BMI Readings from Last 3 Encounters:  10/27/22 41.26 kg/m  07/17/22 43.71 kg/m  06/23/22 38.09 kg/m   Associated with HTN, DM and OSA Diet modification and moderate exercise advised On Ozempic for type II DM Has lost 32 lbs since starting Ozempic        Meds ordered this encounter  Medications   DULoxetine (CYMBALTA) 60 MG capsule    Sig: Take 1 capsule (60 mg total) by mouth daily.     Dispense:  30 capsule    Refill:  3    Please discontinue prescription with BID dosing.   amitriptyline (ELAVIL) 10 MG tablet    Sig: Take 1 tablet (10 mg total) by mouth at bedtime.    Dispense:  30 tablet    Refill:  3   cetirizine (ZYRTEC) 10 MG tablet    Sig: Take 1 tablet (10 mg total) by mouth daily.    Dispense:  30 tablet    Refill:  5    Follow-up: Return in about 4 months (around 02/26/2023) for DM and fibromyalgia.    [name], MD

## 2022-10-27 NOTE — Patient Instructions (Addendum)
Please start taking Amitriptyline at bedtime and Cymbalta in the morning for fibromyalgia.  Please take Colace for constipation. Please maintain at least 64 ounces of fluid in a day.  Please continue to take other medications as prescribed.  Please continue to follow low carb diet and perform moderate exercise/walking at least 150 mins/week.

## 2022-10-27 NOTE — Assessment & Plan Note (Signed)
BP Readings from Last 1 Encounters:  10/27/22 123/83   Well-controlled with amlodipine 5 mg daily Counseled for compliance with the medications Advised to follow DASH diet and perform moderate exercise/walking at least 150 minutes/week

## 2022-10-27 NOTE — Assessment & Plan Note (Addendum)
On Cymbalta 90 mg QD and tramadol 50 mg PRN, will refill for now Added Elavil 10 mg nightly Takes Klonopin for anxiety Used to follow up with neurology-Ayeshia Lowell Guitar, NP

## 2022-10-31 NOTE — Assessment & Plan Note (Signed)
Diffuse, appears to be allergic in etiology, unknown trigger Kenalog cream prescribed Had referred to Allergy clinic

## 2022-10-31 NOTE — Assessment & Plan Note (Signed)
BMI Readings from Last 3 Encounters:  10/27/22 41.26 kg/m  07/17/22 43.71 kg/m  06/23/22 38.09 kg/m   Associated with HTN, DM and OSA Diet modification and moderate exercise advised On Ozempic for type II DM Has lost 32 lbs since starting Ozempic

## 2022-10-31 NOTE — Assessment & Plan Note (Signed)
Likely due to internal hemorrhoids Since she has sporadic episodes, we will focus on treating constipation - advised to take Colace as needed If recurrent episodes, will need GI evaluation

## 2022-11-04 ENCOUNTER — Telehealth: Payer: Self-pay | Admitting: Internal Medicine

## 2022-11-04 ENCOUNTER — Other Ambulatory Visit: Payer: Self-pay

## 2022-11-04 DIAGNOSIS — E1169 Type 2 diabetes mellitus with other specified complication: Secondary | ICD-10-CM

## 2022-11-04 NOTE — Telephone Encounter (Signed)
[facility],  Not accepting new patients, patient will need new referral placed

## 2022-11-22 ENCOUNTER — Other Ambulatory Visit: Payer: Self-pay | Admitting: Adult Health

## 2022-11-25 ENCOUNTER — Encounter: Payer: Self-pay | Admitting: Internal Medicine

## 2022-12-02 NOTE — Progress Notes (Unsigned)
Virtual Visit via Video Note  I connected with Theresa Rosario on 12/03/22 at  2:15 PM EDT by a video enabled telemedicine application and verified that I am speaking with the correct person using two identifiers.  Location: Patient: at home Provider: in office   I discussed the limitations of evaluation and management by telemedicine and the availability of in person appointments. The patient expressed understanding and agreed to proceed.     ASSESSMENT AND PLAN  Theresa Rosario is a 37 y.o. female   Chronic migraine headaches Depression anxiety Obesity Obstructive sleep apnea, could not tolerate CPAP machine,  Can hold off on restarting preventative therapy as only 1 migraine per month (had difficulty obtaining Ajovy from pharmacy)  Recommend trialing naratriptan as needed for rescue, continue tizanidine and Zofran as needed  Likely benefit with rizatriptan, sumatriptan and Nurtec  Advised to call with any worsening migraine headaches     Follow-up in 6 months or call earlier if needed    DIAGNOSTIC DATA (LABS, IMAGING, TESTING) - I reviewed patient records, labs, notes, testing and imaging myself where available.   MEDICAL HISTORY:  Update 12/03/2022 JM: Returns for follow-up visit via MyChart video visit.  At prior visit, was started on Ajovy for prevention and rizatriptan for rescue.  Never started Ajovy due to difficulty getting it from insurance and then migraines gradually improved on their own. Currently having 1 migraine per month, month of August migraine resolved after 2 days. Had onset of migraine this past Saturday and has been persistent since that time (4 days). Improved some today. Has been using rizatriptan, usually needs to take 2nd dose, but denies much benefit, also using Aleve and tizanidine. Use of zofran only as needed but has not need recently.       Consult visit 07/17/2022 Dr. Terrace Arabia: Theresa Rosario is a 37 year old female,  seen in request by her primary care physician Dr. Allena Katz, Dondra Prader K for evaluation of migraine headache, initial evaluation was on July 17, 2022  I reviewed and summarized the referring note. PMHX HTN Fibromyalgia Anxiety DM Obesity OSA, could not tolerate CPAP machine  She had long history of migraine headache, described migraine as lateralized over the left side's retro-orbital area severe pounding headache, with light noise sensitivity, nauseous, lasting for hours to days,  Over the years, she tried different preventive medications, Topamax, was not sure about the benefit, from 2023 until March 2024, she was given aimovig monthly injection by previous neurologist Dr. Gerilyn Pilgrim, during that period of time, her headache was under best control, only happen few times each year  However since February 2024, she began to have frequent migraine headache almost daily basis, Imitrex Nurtec as needed was not very effective  She has stopped aimovig use since March because Dr. Ronal Fear office has closed off  She does have obstructive sleep apnea, tried CPAP, could not tolerated, also have obesity, no significant visual loss, no whooshing tinnitus,   PHYSICAL EXAM: N/A d/t visit type     REVIEW OF SYSTEMS:  Full 14 system review of systems performed and notable only for as above All other review of systems were negative.   ALLERGIES: Allergies  Allergen Reactions   Amoxicillin Hives   Banana Itching    Tongue itching   Motrin [Ibuprofen] Rash    MOTRIN (Brand only) causes this allergy (tolerates ADVIL brand)   Other Itching    Tree nuts cause tongue itching    Pineapple Itching  Tongue itching    HOME MEDICATIONS: Current Outpatient Medications  Medication Sig Dispense Refill   naratriptan (AMERGE) 2.5 MG tablet Take 1 tablet (2.5 mg total) by mouth as needed for migraine. Take one (1) tablet at onset of headache; if returns or does not resolve, may repeat after 4 hours; do  not exceed five (5) mg in 24 hours. 10 tablet 11   acetaminophen (TYLENOL) 500 MG tablet Take 2 tablets (1,000 mg total) by mouth every 6 (six) hours. 30 tablet 0   amitriptyline (ELAVIL) 10 MG tablet Take 1 tablet (10 mg total) by mouth at bedtime. 30 tablet 3   amLODipine (NORVASC) 5 MG tablet TAKE 1 TABLET(5 MG) BY MOUTH DAILY 30 tablet 6   cetirizine (ZYRTEC) 10 MG tablet Take 1 tablet (10 mg total) by mouth daily. 30 tablet 5   clonazePAM (KLONOPIN) 0.5 MG tablet Take 0.5 mg by mouth 2 (two) times daily as needed.     DULoxetine (CYMBALTA) 60 MG capsule Take 1 capsule (60 mg total) by mouth daily. 30 capsule 3   ibuprofen (ADVIL) 200 MG tablet Take 200 mg by mouth every 6 (six) hours as needed.     norethindrone (MICRONOR) 0.35 MG tablet TAKE 1 TABLET(0.35 MG) BY MOUTH DAILY 28 tablet 1   OZEMPIC, 2 MG/DOSE, 8 MG/3ML SOPN INJECT 2 MG UNDER THE SKIN ONCE A WEEK 3 mL 3   pantoprazole (PROTONIX) 40 MG tablet TAKE 1 TABLET(40 MG) BY MOUTH DAILY 90 tablet 1   Rimegepant Sulfate (NURTEC) 75 MG TBDP Take 1 tablet (75 mg total) by mouth daily as needed (for migraine). 16 tablet 1   rizatriptan (MAXALT-MLT) 10 MG disintegrating tablet Take 1 tablet (10 mg total) by mouth as needed. May repeat in 2 hours if needed 12 tablet 6   tiZANidine (ZANAFLEX) 4 MG tablet Take 1 tablet (4 mg total) by mouth every 6 (six) hours as needed. 30 tablet 6   traMADol (ULTRAM) 50 MG tablet Take 1 tablet (50 mg total) by mouth every 8 (eight) hours as needed. 30 tablet 0   Vitamin D, Ergocalciferol, (DRISDOL) 1.25 MG (50000 UNIT) CAPS capsule Take 1 capsule (50,000 Units total) by mouth every 7 (seven) days. 12 capsule 1   No current facility-administered medications for this visit.    PAST MEDICAL HISTORY: Past Medical History:  Diagnosis Date   Abnormal chromosomal and genetic finding on antenatal screening mother 02/06/2020   On Horizon Nov 2021     Silent carrier for alpha-thalassemia (FOB____)  and increased  carrier risk for SMA  (FOB____)   Anemia 12/30/2018   Blood in urine 10/18/2015   Condyloma of female genitalia 10/21/2012   Subclitoral, TCA 7/31    Depression    Diabetes mellitus without complication (HCC)    Fibromyalgia    Hx of hematuria    Hx of ovarian cyst    Hypertension    Irregular bleeding 12/02/2017   Kidney stone    History of kidney stones   Large breasts    Lumbago 11/11/2011   Migraine     PAST SURGICAL HISTORY: Past Surgical History:  Procedure Laterality Date   DENTAL SURGERY     WISDOM TOOTH EXTRACTION Bilateral     FAMILY HISTORY: Family History  Problem Relation Age of Onset   Diabetes Paternal Grandmother    Hypertension Paternal Grandmother    Diabetes Maternal Grandmother    Heart disease Maternal Grandfather    Hypertension Father    Diabetes Mother  Hypertension Mother    Parkinson's disease Mother    Colon cancer Mother 84   Coronary artery disease Other    Diabetes Other     SOCIAL HISTORY: Social History   Socioeconomic History   Marital status: Significant Other    Spouse name: Not on file   Number of children: 2   Years of education: college   Highest education level: Master's degree (e.g., MA, MS, MEng, MEd, MSW, MBA)  Occupational History   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 Right-handed. 2-3 cups caffeine per day.   Social Determinants of Health   Financial  Resource Strain: Low Risk  (08/29/2020)   Overall Financial Resource Strain (CARDIA)    Difficulty of Paying Living Expenses: Not very hard  Food Insecurity: No Food Insecurity (08/29/2020)   Hunger Vital Sign    Worried About Running Out of Food in the Last Year: Never true    Ran Out of Food in the Last Year: Never true  Transportation Needs: No Transportation Needs (08/29/2020)   PRAPARE - Administrator, Civil Service (Medical): No    Lack of Transportation (Non-Medical): No  Physical Activity: Inactive (08/29/2020)   Exercise Vital Sign    Days of Exercise per Week: 0 days    Minutes of Exercise per Session: 0 min  Stress: No Stress Concern Present (08/29/2020)   [employer] of Occupational Health - Occupational Stress Questionnaire    Feeling of Stress : Only a little  Social Connections: Moderately Isolated (08/29/2020)   Social Connection and Isolation Panel [NHANES]    Frequency of Communication with Friends and Family: Once a week    Frequency of Social Gatherings with Friends and Family: Never    Attends Religious Services: 1 to 4 times per year    Active Member of [employer] or Organizations: No    Attends Banker Meetings: Never    Marital Status: Living with partner  Intimate Partner Violence: Not At Risk (08/29/2020)   Humiliation, Afraid, Rape, and Kick questionnaire    Fear of Current or Ex-Partner: No    Emotionally Abused: No    Physically Abused: No    Sexually Abused: No      I spent 25 minutes of face-to-face and non-face-to-face time with patient via MyChart video visit.  This included previsit chart review, lab review, study review, order entry, electronic health record documentation, patient education and discussion regarding above diagnoses and treatment plan and answered all other questions to patient's satisfaction  Ihor Austin, [place]  [facility] [address] [place], [place] [address]  Phone  [phone] Fax [phone] Note: This document was prepared with digital dictation and possible smart phrase technology. Any transcriptional errors that result from this process are unintentional.

## 2022-12-03 ENCOUNTER — Other Ambulatory Visit (HOSPITAL_COMMUNITY)
Admission: RE | Admit: 2022-12-03 | Discharge: 2022-12-03 | Disposition: A | Discharge status: Home / Self Care | Payer: Medicaid Other | Source: Ambulatory Visit | Attending: Family Medicine | Admitting: Family Medicine

## 2022-12-03 ENCOUNTER — Encounter: Payer: Self-pay | Admitting: Family Medicine

## 2022-12-03 ENCOUNTER — Encounter: Payer: Self-pay | Admitting: Adult Health

## 2022-12-03 ENCOUNTER — Ambulatory Visit: Payer: Medicaid Other | Admitting: Family Medicine

## 2022-12-03 ENCOUNTER — Telehealth: Payer: Self-pay | Admitting: Internal Medicine

## 2022-12-03 ENCOUNTER — Telehealth (INDEPENDENT_AMBULATORY_CARE_PROVIDER_SITE_OTHER): Payer: Medicaid Other | Admitting: Adult Health

## 2022-12-03 VITALS — BP 122/86 | HR 79 | Ht 62.0 in | Wt 225.1 lb

## 2022-12-03 DIAGNOSIS — Z124 Encounter for screening for malignant neoplasm of cervix: Secondary | ICD-10-CM | POA: Diagnosis not present

## 2022-12-03 DIAGNOSIS — E669 Obesity, unspecified: Secondary | ICD-10-CM

## 2022-12-03 DIAGNOSIS — G4733 Obstructive sleep apnea (adult) (pediatric): Secondary | ICD-10-CM

## 2022-12-03 DIAGNOSIS — E119 Type 2 diabetes mellitus without complications: Secondary | ICD-10-CM | POA: Diagnosis not present

## 2022-12-03 DIAGNOSIS — G43709 Chronic migraine without aura, not intractable, without status migrainosus: Secondary | ICD-10-CM

## 2022-12-03 DIAGNOSIS — F418 Other specified anxiety disorders: Secondary | ICD-10-CM

## 2022-12-03 LAB — HM DIABETES EYE EXAM

## 2022-12-03 MED ORDER — NARATRIPTAN HCL 2.5 MG PO TABS: 2.5000 mg | ORAL_TABLET | ORAL | 11 refills | Status: AC | PRN

## 2022-12-03 NOTE — Telephone Encounter (Signed)
Health Examination Certificate  Copied Noted Sleeved (put in provider box)  Call patient when ready at [phone].

## 2022-12-03 NOTE — Patient Instructions (Signed)
Recommend trialing naratriptan 2.5 mg as needed for migraine rescue.  Can repeat after 4 hours if needed.  Do not take more than 2 tablets in a 24-hour.  Do not combine with any other rescue medication such as rizatriptan or Nurtec  Can hold off on restarting preventative migraine therapy but please call with any worsening/increased frequency of migraine headaches    Follow-up in 6 months or call earlier if needed

## 2022-12-03 NOTE — Patient Instructions (Signed)
I appreciate the opportunity to provide care to you today!    Follow up:  PCP  We will let you the results of your pap on Mychart  Attached with your AVS, you will find valuable resources for self-education. I highly recommend dedicating some time to thoroughly examine them.   Please continue to a heart-healthy diet and increase your physical activities. Try to exercise for at least five days a week.    It was a pleasure to see you and I look forward to continuing to work together on your health and well-being. Please do not hesitate to call the office if you need care or have questions about your care.  In case of emergency, please visit the Emergency Department for urgent care, or contact our clinic at [phone] to schedule an appointment. We're here to help you!   Have a wonderful day and week. With Gratitude, Gilmore Laroche MSN, FNP-BC

## 2022-12-03 NOTE — Assessment & Plan Note (Addendum)
According to the American Cancer Society, cytology and HPV co-testing (preferred) every 5 years or cytology alone (acceptable) every 3 years. - Pap due 2029

## 2022-12-03 NOTE — Progress Notes (Addendum)
Acute Office Visit  Subjective:    Patient ID: Theresa Rosario, female    DOB: [birth year], 37 y.o.   MRN: [id number]  Chief Complaint  Patient presents with   Gynecologic Exam    Pap today    HPI Patient is in today for a Pap smear examination.  Past Medical History:  Diagnosis Date   Abnormal chromosomal and genetic finding on antenatal screening mother 02/06/2020   On Horizon Nov 2021     Silent carrier for alpha-thalassemia (FOB____)  and increased carrier risk for SMA  (FOB____)   Anemia 12/30/2018   Blood in urine 10/18/2015   Condyloma of female genitalia 10/21/2012   Subclitoral, TCA 7/31    Depression    Diabetes mellitus without complication (HCC)    Fibromyalgia    Hx of hematuria    Hx of ovarian cyst    Hypertension    Irregular bleeding 12/02/2017   Kidney stone    History of kidney stones   Large breasts    Lumbago 11/11/2011   Migraine     Past Surgical History:  Procedure Laterality Date   DENTAL SURGERY     WISDOM TOOTH EXTRACTION Bilateral     Family History  Problem Relation Age of Onset   Diabetes Paternal Grandmother    Hypertension Paternal Grandmother    Diabetes Maternal Grandmother    Heart disease Maternal Grandfather    Hypertension Father    Diabetes Mother    Hypertension Mother    Parkinson's disease Mother    Colon cancer Mother 36   Coronary artery disease Other    Diabetes Other     Social History   Socioeconomic History   Marital status: Significant Other    Spouse name: Not on file   Number of children: 2   Years of education: college   Highest education level: Master's degree (e.g., MA, MS, MEng, MEd, MSW, MBA)  Occupational History   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 Right-handed. 2-3 cups caffeine per day.   Social Determinants of Health   Financial Resource Strain: Low Risk  (08/29/2020)   Overall Financial Resource Strain (CARDIA)    Difficulty of Paying Living Expenses: Not very hard  Food Insecurity: No Food Insecurity (08/29/2020)   Hunger Vital Sign    Worried About Running Out of Food in the Last Year: Never true    Ran Out of Food in the Last Year: Never true  Transportation Needs: No Transportation Needs (08/29/2020)   PRAPARE - Administrator, Civil Service (Medical): No    Lack of Transportation (Non-Medical): No  Physical Activity: Inactive (08/29/2020)   Exercise Vital Sign    Days of Exercise per Week: 0 days    Minutes of Exercise per Session: 0 min  Stress: No Stress Concern Present (08/29/2020)   [employer] of Occupational Health - Occupational Stress Questionnaire    Feeling of Stress : Only a little  Social Connections: Moderately Isolated (08/29/2020)   Social Connection and Isolation Panel [NHANES]    Frequency of Communication with Friends and Family: Once a week    Frequency of Social Gatherings with Friends and Family: Never    Attends Religious Services: 1 to 4 times per year    Active Member of [employer] or Organizations: No    Attends Banker Meetings: Never    Marital Status: Living with partner  Intimate Partner Violence: Not At Risk (08/29/2020)   Humiliation, Afraid, Rape, and Kick questionnaire    Fear of Current or Ex-Partner: No    Emotionally Abused: No    Physically Abused: No    Sexually Abused: No    Outpatient Medications Prior to Visit  Medication Sig Dispense  Refill   acetaminophen (TYLENOL) 500 MG tablet Take 2 tablets (1,000 mg total) by mouth every 6 (six) hours. 30 tablet 0   amitriptyline (ELAVIL) 10 MG tablet Take 1 tablet (10 mg total) by mouth at bedtime. 30 tablet 3   amLODipine (NORVASC) 5 MG tablet TAKE 1 TABLET(5 MG) BY MOUTH DAILY 30 tablet 6   cetirizine (ZYRTEC) 10 MG tablet Take 1 tablet (10 mg total) by mouth daily. 30 tablet 5   clonazePAM (KLONOPIN) 0.5 MG tablet Take 0.5 mg by mouth 2 (two) times daily as needed.     DULoxetine (CYMBALTA) 60 MG capsule Take 1 capsule (60 mg total) by mouth daily. 30 capsule 3   ibuprofen (ADVIL) 200 MG tablet Take 200 mg by mouth every 6 (six) hours as needed.     norethindrone (MICRONOR) 0.35 MG tablet TAKE 1 TABLET(0.35 MG) BY MOUTH DAILY 28 tablet 1   OZEMPIC, 2 MG/DOSE, 8 MG/3ML SOPN INJECT 2 MG UNDER THE SKIN ONCE A WEEK 3 mL 3   pantoprazole (PROTONIX) 40 MG tablet TAKE 1 TABLET(40 MG) BY MOUTH DAILY 90 tablet 1   Rimegepant Sulfate (NURTEC) 75 MG TBDP Take 1 tablet (75 mg total) by mouth daily as needed (for migraine). 16 tablet 1   rizatriptan (MAXALT-MLT) 10 MG disintegrating tablet Take 1 tablet (10 mg total) by mouth as needed. May repeat in 2 hours if needed 12 tablet 6   tiZANidine (ZANAFLEX) 4 MG tablet Take 1 tablet (4 mg total) by mouth every 6 (six) hours as needed. 30 tablet 6   traMADol (ULTRAM) 50 MG tablet Take 1 tablet (50 mg total) by mouth every 8 (eight) hours as needed. 30 tablet 0   Vitamin D, Ergocalciferol, (DRISDOL) 1.25 MG (50000 UNIT) CAPS capsule Take 1 capsule (50,000 Units total) by mouth every 7 (seven) days. 12 capsule 1   No facility-administered medications prior to visit.    Allergies  Allergen Reactions   Amoxicillin Hives   Banana Itching    Tongue itching   Motrin [Ibuprofen] Rash    MOTRIN (Brand only) causes this allergy (tolerates ADVIL brand)   Other Itching    Tree nuts cause tongue itching    Pineapple Itching    Tongue itching     Review of Systems  Constitutional:  Negative for chills and fever.  Eyes:  Negative for visual disturbance.  Respiratory:  Negative for chest tightness and shortness of breath.   Genitourinary:  Negative for decreased urine volume, dyspareunia, menstrual problem, vaginal bleeding and vaginal discharge.  Neurological:  Negative for dizziness and headaches.       Objective:    Physical Exam HENT:     Head: Normocephalic.     Mouth/Throat:     Mouth: Mucous membranes are moist.  Cardiovascular:     Rate and Rhythm: Normal rate.     Heart sounds: Normal heart sounds.  Pulmonary:     Effort: Pulmonary effort is normal.     Breath sounds: Normal breath sounds.  Chest:     Chest wall: No mass or deformity.  Breasts:    Right: No swelling, inverted nipple, mass, nipple discharge or skin change.     Left: No swelling, inverted nipple, mass, nipple discharge or skin change.     Comments: Breast exam performed Genitourinary:    General: Normal vulva.     Exam position: Lithotomy position.     Tanner stage (genital): 5.     Comments: Vaginal wall: pink and rugated, smooth and non-tender; absence of lesions, edema, and erythema. Labia Majora and Minora: present bilaterally, moist, soft tissue, and homogeneous; free of edema and ulcerations. Clitoris is anatomically present, above the urethral, and free of lesions, masses, and ulceration.    Neurological:     Mental Status: She is alert.     BP 122/86   Pulse 79   Ht 5\' 2"  (1.575 m)   Wt 225 lb 1.9 oz (102.1 kg)   SpO2 98%   BMI 41.17 kg/m  Wt Readings from Last 3 Encounters:  12/03/22 225 lb 1.9 oz (102.1 kg)  10/27/22 225 lb 9.6 oz (102.3 kg)  07/17/22 239 lb (108.4 kg)       Assessment & Plan:  Cervical cancer screening Assessment & Plan:  According to the American Cancer Society, cytology and HPV co-testing (preferred) every 5 years or cytology alone (acceptable) every 3 years. - Pap due 2029         Orders: -     Cytology - PAP  Diabetic eye exam [facility]) Assessment & Plan: Chrissi T Antwi arrived 12/03/2022 and has given verbal consent to obtain images and complete their overdue diabetic retinal screening.  The images have been sent to an ophthalmologist or optometrist for review and interpretation.  Results will be sent back to [name], MD for review.  Patient has been informed they will be contacted when we receive the results via telephone or MyChart   Note: This chart has been completed using Dragon Medical Dictation software, and while attempts have been made to ensure accuracy, certain words and phrases may not be transcribed as intended.    Gilmore Laroche, FNP

## 2022-12-05 LAB — CYTOLOGY - PAP
Chlamydia: NEGATIVE
Comment: NEGATIVE
Comment: NEGATIVE
Comment: NEGATIVE
Comment: NORMAL
Diagnosis: NEGATIVE
High risk HPV: NEGATIVE
Neisseria Gonorrhea: NEGATIVE
Trichomonas: NEGATIVE

## 2022-12-08 ENCOUNTER — Ambulatory Visit (INDEPENDENT_AMBULATORY_CARE_PROVIDER_SITE_OTHER): Payer: Medicaid Other

## 2022-12-08 DIAGNOSIS — Z111 Encounter for screening for respiratory tuberculosis: Secondary | ICD-10-CM

## 2022-12-10 ENCOUNTER — Other Ambulatory Visit: Payer: Self-pay

## 2022-12-10 DIAGNOSIS — Z135 Encounter for screening for eye and ear disorders: Secondary | ICD-10-CM

## 2022-12-12 ENCOUNTER — Other Ambulatory Visit: Payer: Self-pay | Admitting: Internal Medicine

## 2022-12-12 DIAGNOSIS — E559 Vitamin D deficiency, unspecified: Secondary | ICD-10-CM

## 2022-12-15 DIAGNOSIS — E119 Type 2 diabetes mellitus without complications: Secondary | ICD-10-CM | POA: Insufficient documentation

## 2022-12-15 NOTE — Assessment & Plan Note (Signed)
[name] arrived 12/03/2022 and has given verbal consent to obtain images and complete their overdue diabetic retinal screening.  The images have been sent to an ophthalmologist or optometrist for review and interpretation.  Results will be sent back to [name], MD for review.  Patient has been informed they will be contacted when we receive the results via telephone or MyChart

## 2022-12-25 ENCOUNTER — Encounter: Payer: Self-pay | Admitting: Internal Medicine

## 2023-02-09 ENCOUNTER — Other Ambulatory Visit: Payer: Self-pay | Admitting: Adult Health

## 2023-02-11 ENCOUNTER — Other Ambulatory Visit: Payer: Self-pay | Admitting: Neurology

## 2023-02-23 ENCOUNTER — Other Ambulatory Visit: Payer: Self-pay | Admitting: Adult Health

## 2023-02-26 ENCOUNTER — Encounter: Payer: Self-pay | Admitting: Internal Medicine

## 2023-02-26 ENCOUNTER — Ambulatory Visit: Payer: Medicaid Other | Admitting: Internal Medicine

## 2023-02-26 VITALS — BP 116/86 | HR 79 | Ht 61.0 in | Wt 224.2 lb

## 2023-02-26 DIAGNOSIS — I10 Essential (primary) hypertension: Secondary | ICD-10-CM | POA: Diagnosis not present

## 2023-02-26 DIAGNOSIS — J3089 Other allergic rhinitis: Secondary | ICD-10-CM

## 2023-02-26 DIAGNOSIS — K625 Hemorrhage of anus and rectum: Secondary | ICD-10-CM | POA: Diagnosis not present

## 2023-02-26 DIAGNOSIS — E1169 Type 2 diabetes mellitus with other specified complication: Secondary | ICD-10-CM

## 2023-02-26 DIAGNOSIS — Z23 Encounter for immunization: Secondary | ICD-10-CM

## 2023-02-26 DIAGNOSIS — G43E19 Chronic migraine with aura, intractable, without status migrainosus: Secondary | ICD-10-CM | POA: Diagnosis not present

## 2023-02-26 DIAGNOSIS — M940 Chondrocostal junction syndrome [Tietze]: Secondary | ICD-10-CM | POA: Diagnosis not present

## 2023-02-26 DIAGNOSIS — M797 Fibromyalgia: Secondary | ICD-10-CM | POA: Diagnosis not present

## 2023-02-26 DIAGNOSIS — Z7985 Long-term (current) use of injectable non-insulin antidiabetic drugs: Secondary | ICD-10-CM | POA: Diagnosis not present

## 2023-02-26 MED ORDER — AMITRIPTYLINE HCL 25 MG PO TABS
25.0000 mg | ORAL_TABLET | Freq: Every day | ORAL | 3 refills | Status: DC
Start: 2023-02-26 — End: 2023-07-01

## 2023-02-26 MED ORDER — OZEMPIC (2 MG/DOSE) 8 MG/3ML ~~LOC~~ SOPN: 2.0000 mg | PEN_INJECTOR | SUBCUTANEOUS | 3 refills | Status: DC

## 2023-02-26 NOTE — Patient Instructions (Signed)
Please start taking Amitriptyline 25 mg once daily.  Please continue to take medications as prescribed.  Please continue to follow low carb diet and perform moderate exercise/walking at least 150 mins/week.

## 2023-02-26 NOTE — Assessment & Plan Note (Signed)
Lab Results  Component Value Date   HGBA1C 5.6 10/27/2022   Well-controlled Associated with HTN and OSA Had added Ozempic for insulin resistance and weight loss benefit - 2 mg qw Advised to follow diabetic diet F/u CMP and lipid panel Diabetic eye exam: Advised to follow up with Ophthalmology for diabetic eye exam

## 2023-02-26 NOTE — Assessment & Plan Note (Addendum)
Well controlled currently Has Maxalt and Nurtec as needed for breakthrough headache Elavil has likely improved her migraine frequency Followed by neurology

## 2023-02-26 NOTE — Assessment & Plan Note (Addendum)
BMI Readings from Last 3 Encounters:  02/26/23 42.36 kg/m  12/03/22 41.17 kg/m  10/27/22 41.26 kg/m   Associated with HTN, DM and OSA Diet modification and moderate exercise advised On Ozempic for type II DM Has lost 32 lbs since starting Ozempic, but has plateaued now - needs to follow strict low carb diet

## 2023-02-26 NOTE — Progress Notes (Signed)
Established Patient Office Visit  Subjective:  Patient ID: Theresa Rosario, female    DOB: September 13, 1985  Age: 37 y.o. MRN: [id number]  CC:  Chief Complaint  Patient presents with   Diabetes    Four month follow up    Fibromyalgia    Four month follow up    chest discomfort    Chest discomfort when taking a deep breath     HPI Theresa Rosario is a 37 y.o. female with past medical history of hypertension, type 2 DM, fibromyalgia, insomnia, OSA and morbid obesity who presents for f/u of her chronic medical conditions.  HTN: BP is well-controlled. Takes amlodipine 5 mg QD regularly. Patient denies dizziness, chest pain, dyspnea or palpitations.  Type II DM: Her HbA1C was 5.6 in 08/24.  She has started taking Ozempic 2 mg qw and has been tolerating it well.  She had lost about 32 lbs since starting Ozempic, but has been overall stable since the last visit.  She has been trying to follow low-carb diet, but admits that she still needs to improve her diet.  She has chronic fatigue, but denies any polyuria or polyphagia currently.  Fibromyalgia: She has started taking Elavil 10 mg QD and has noticed improvement in her sleep, myalgias and her migraine frequency has also improved.  She still reports sleep interruptions.  She is also taking tizanidine 4 mg nightly and Cymbalta 60 mg daily.  She takes tramadol for acute flareups, and needs it only on few days in a month. She sees [facility] neurology for migraine.  She has clonazepam PRN for anxiety.  She has tried BuSpar in the past.  She has hair pulling episodes during anxiety spells.  Denies any SI or HI currently.  She reports mild chest discomfort, with coughing and deep breaths since this morning. She also reports mild chest tenderness. Denies heaviness feeling, dyspnea or palpitations.  Past Medical History:  Diagnosis Date   Abnormal chromosomal and genetic finding on antenatal screening mother 02/06/2020   On Horizon Nov 2021      Silent carrier for alpha-thalassemia (FOB____)  and increased carrier risk for SMA  (FOB____)   Anemia 12/30/2018   Blood in urine 10/18/2015   Condyloma of female genitalia 10/21/2012   Subclitoral, TCA 7/31    Depression    Diabetes mellitus without complication (HCC)    Fibromyalgia    Hx of hematuria    Hx of ovarian cyst    Hypertension    Irregular bleeding 12/02/2017   Kidney stone    History of kidney stones   Large breasts    Lumbago 11/11/2011   Migraine     Past Surgical History:  Procedure Laterality Date   DENTAL SURGERY     WISDOM TOOTH EXTRACTION Bilateral     Family History  Problem Relation Age of Onset   Diabetes Paternal Grandmother    Hypertension Paternal Grandmother    Diabetes Maternal Grandmother    Heart disease Maternal Grandfather    Hypertension Father    Diabetes Mother    Hypertension Mother    Parkinson's disease Mother    Colon cancer Mother 43   Coronary artery disease Other    Diabetes Other     Social History   Socioeconomic History   Marital status: Significant Other    Spouse name: Not on file   Number of children: 2   Years of education: college   Highest education level: Master's degree (e.g., MA, MS, MEng, MEd,  MSW, MBA)  Occupational History   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 Right-handed. 2-3 cups caffeine per day.   Social Determinants of Health   Financial  Resource Strain: Low Risk  (08/29/2020)   Overall Financial Resource Strain (CARDIA)    Difficulty of Paying Living Expenses: Not very hard  Food Insecurity: No Food Insecurity (08/29/2020)   Hunger Vital Sign    Worried About Running Out of Food in the Last Year: Never true    Ran Out of Food in the Last Year: Never true  Transportation Needs: No Transportation Needs (08/29/2020)   PRAPARE - Administrator, Civil Service (Medical): No    Lack of Transportation (Non-Medical): No  Physical Activity: Inactive (08/29/2020)   Exercise Vital Sign    Days of Exercise per Week: 0 days    Minutes of Exercise per Session: 0 min  Stress: No Stress Concern Present (08/29/2020)   [employer] of Occupational Health - Occupational Stress Questionnaire    Feeling of Stress : Only a little  Social Connections: Moderately Isolated (08/29/2020)   Social Connection and Isolation Panel [NHANES]    Frequency of Communication with Friends and Family: Once a week    Frequency of Social Gatherings with Friends and Family: Never    Attends Religious Services: 1 to 4 times per year    Active Member of [employer] or Organizations: No    Attends Banker Meetings: Never    Marital Status: Living with partner  Intimate Partner Violence: Not At Risk (08/29/2020)   Humiliation, Afraid, Rape, and Kick questionnaire    Fear of Current or Ex-Partner: No    Emotionally Abused: No    Physically Abused: No    Sexually Abused: No    Outpatient Medications Prior to Visit  Medication Sig Dispense Refill   acetaminophen (TYLENOL) 500 MG tablet Take 2 tablets (1,000 mg total) by mouth every 6 (six) hours. 30 tablet 0   amLODipine (NORVASC) 5 MG tablet TAKE 1 TABLET(5 MG) BY MOUTH DAILY 30 tablet 6   cetirizine (ZYRTEC) 10 MG tablet Take 1 tablet (10 mg total) by mouth daily. 30 tablet 5   clonazePAM (KLONOPIN) 0.5 MG tablet Take 0.5 mg by mouth 2 (two) times daily as needed.     DULoxetine (CYMBALTA) 60 MG  capsule Take 1 capsule (60 mg total) by mouth daily. 30 capsule 3   ibuprofen (ADVIL) 200 MG tablet Take 200 mg by mouth every 6 (six) hours as needed.     naratriptan (AMERGE) 2.5 MG tablet Take 1 tablet (2.5 mg total) by mouth as needed for migraine. Take one (1) tablet at onset of headache; if returns or does not resolve, may repeat after 4 hours; do not exceed five (5) mg in 24 hours. 10 tablet  11   norethindrone (MICRONOR) 0.35 MG tablet TAKE 1 TABLET(0.35 MG) BY MOUTH DAILY 28 tablet 1   pantoprazole (PROTONIX) 40 MG tablet TAKE 1 TABLET(40 MG) BY MOUTH DAILY 90 tablet 1   Rimegepant Sulfate (NURTEC) 75 MG TBDP Take 1 tablet (75 mg total) by mouth daily as needed (for migraine). 16 tablet 1   rizatriptan (MAXALT-MLT) 10 MG disintegrating tablet Take 1 tablet (10 mg total) by mouth as needed. May repeat in 2 hours if needed 12 tablet 6   tiZANidine (ZANAFLEX) 4 MG tablet TAKE 1 TABLET(4 MG) BY MOUTH EVERY 6 HOURS AS NEEDED 30 tablet 9   traMADol (ULTRAM) 50 MG tablet Take 1 tablet (50 mg total) by mouth every 8 (eight) hours as needed. 30 tablet 0   Vitamin D, Ergocalciferol, (DRISDOL) 1.25 MG (50000 UNIT) CAPS capsule TAKE 1 CAPSULE BY MOUTH EVERY 7 DAYS 12 capsule 1   amitriptyline (ELAVIL) 10 MG tablet Take 1 tablet (10 mg total) by mouth at bedtime. 30 tablet 3   OZEMPIC, 2 MG/DOSE, 8 MG/3ML SOPN INJECT 2 MG UNDER THE SKIN ONCE A WEEK 3 mL 3   No facility-administered medications prior to visit.    Allergies  Allergen Reactions   Amoxicillin Hives   Banana Itching    Tongue itching   Motrin [Ibuprofen] Rash    MOTRIN (Brand only) causes this allergy (tolerates ADVIL brand)   Other Itching    Tree nuts cause tongue itching    Pineapple Itching    Tongue itching    ROS Review of Systems  Constitutional:  Negative for chills and fever.  HENT:  Negative for sinus pressure, sinus pain and sore throat.   Eyes:  Negative for pain and discharge.  Respiratory:  Negative for cough  and shortness of breath.   Cardiovascular:  Negative for chest pain and palpitations.  Gastrointestinal:  Positive for constipation. Negative for diarrhea, nausea and vomiting.  Endocrine: Negative for polydipsia and polyuria.  Genitourinary:  Negative for dysuria and hematuria.  Musculoskeletal:  Positive for arthralgias, back pain and myalgias. Negative for neck pain and neck stiffness.  Skin:  Negative for rash.  Neurological:  Positive for headaches. Negative for dizziness, syncope, weakness and numbness.  Psychiatric/Behavioral:  Positive for sleep disturbance. Negative for agitation and behavioral problems.       Objective:    Physical Exam Vitals reviewed.  Constitutional:      General: She is not in acute distress.    Appearance: She is obese. She is not diaphoretic.  HENT:     Head: Normocephalic and atraumatic.     Nose: Nose normal.     Mouth/Throat:     Mouth: Mucous membranes are moist.  Eyes:     General: No scleral icterus.    Extraocular Movements: Extraocular movements intact.  Cardiovascular:     Rate and Rhythm: Normal rate and regular rhythm.     Heart sounds: Normal heart sounds. No murmur heard. Pulmonary:     Breath sounds: Normal breath sounds. No wheezing or rales.  Chest:     Chest wall: Tenderness (left sided chest wall) present.  Musculoskeletal:     Cervical back: Neck supple. No tenderness.     Right lower leg: No edema.     Left lower leg: No edema.  Skin:    General: Skin is warm.     Findings: No rash.  Neurological:     General: No focal deficit present.     Mental Status:  She is alert and oriented to person, place, and time.  Psychiatric:        Mood and Affect: Mood normal.        Behavior: Behavior normal.     BP 116/86 (BP Location: Left Arm)   Pulse 79   Ht 5\' 1"  (1.549 m)   Wt 224 lb 3.2 oz (101.7 kg)   SpO2 97%   BMI 42.36 kg/m  Wt Readings from Last 3 Encounters:  02/26/23 224 lb 3.2 oz (101.7 kg)  12/03/22 225 lb  1.9 oz (102.1 kg)  10/27/22 225 lb 9.6 oz (102.3 kg)    Lab Results  Component Value Date   TSH 1.660 06/23/2022   Lab Results  Component Value Date   WBC 8.5 06/23/2022   HGB 12.3 06/23/2022   HCT 39.8 06/23/2022   MCV 75 (L) 06/23/2022   PLT 342 06/23/2022   Lab Results  Component Value Date   NA 139 06/23/2022   K 4.0 06/23/2022   CO2 23 06/23/2022   GLUCOSE 77 06/23/2022   BUN 13 06/23/2022   CREATININE 0.65 06/23/2022   BILITOT <0.2 06/23/2022   ALKPHOS 104 06/23/2022   AST 16 06/23/2022   ALT 14 06/23/2022   PROT 6.8 06/23/2022   ALBUMIN 3.9 06/23/2022   CALCIUM 8.7 06/23/2022   ANIONGAP 9 07/22/2020   EGFR 117 06/23/2022   Lab Results  Component Value Date   CHOL 139 06/23/2022   Lab Results  Component Value Date   HDL 34 (L) 06/23/2022   Lab Results  Component Value Date   LDLCALC 89 06/23/2022   Lab Results  Component Value Date   TRIG 81 06/23/2022   Lab Results  Component Value Date   CHOLHDL 4.1 06/23/2022   Lab Results  Component Value Date   HGBA1C 5.6 10/27/2022      Assessment & Plan:   Problem List Items Addressed This Visit       Cardiovascular and Mediastinum   Migraine    Well controlled currently Has Maxalt and Nurtec as needed for breakthrough headache Elavil has likely improved her migraine frequency Followed by neurology      Relevant Medications   amitriptyline (ELAVIL) 25 MG tablet   Essential hypertension    BP Readings from Last 1 Encounters:  02/26/23 116/86   Well-controlled with amlodipine 5 mg daily Counseled for compliance with the medications Advised to follow DASH diet and perform moderate exercise/walking at least 150 minutes/week        Respiratory   Allergic rhinitis    On Zyrtec currently Uses Flonase Eye itchiness likely due to allergies as well        Endocrine   Type 2 diabetes mellitus with other specified complication (HCC) - Primary    Lab Results  Component Value Date    HGBA1C 5.6 10/27/2022   Well-controlled Associated with HTN and OSA Had added Ozempic for insulin resistance and weight loss benefit - 2 mg qw Advised to follow diabetic diet F/u CMP and lipid panel Diabetic eye exam: Advised to follow up with Ophthalmology for diabetic eye exam      Relevant Medications   Semaglutide, 2 MG/DOSE, (OZEMPIC, 2 MG/DOSE,) 8 MG/3ML SOPN     Musculoskeletal and Integument   Costochondritis    Chest discomfort with tenderness likely due to costochondritis Ibuprofen as needed for pain If persistent, can prescribe oral steroid        Other   Fibromyalgia    Had added  Elavil, has improved sleep and myalgias-increased dose to 25 mg at bedtime to improve sleep quality On Cymbalta 60 mg QD and tramadol 50 mg PRN, will continue for now      Relevant Medications   amitriptyline (ELAVIL) 25 MG tablet   Morbid obesity (HCC)    BMI Readings from Last 3 Encounters:  02/26/23 42.36 kg/m  12/03/22 41.17 kg/m  10/27/22 41.26 kg/m   Associated with HTN, DM and OSA Diet modification and moderate exercise advised On Ozempic for type II DM Has lost 32 lbs since starting Ozempic, but has plateaued now - needs to follow strict low carb diet      Relevant Medications   Semaglutide, 2 MG/DOSE, (OZEMPIC, 2 MG/DOSE,) 8 MG/3ML SOPN   Other Visit Diagnoses     Encounter for immunization       Relevant Orders   Flu vaccine trivalent PF, 6mos and older(Flulaval,Afluria,Fluarix,Fluzone) (Completed)        Meds ordered this encounter  Medications   amitriptyline (ELAVIL) 25 MG tablet    Sig: Take 1 tablet (25 mg total) by mouth at bedtime.    Dispense:  30 tablet    Refill:  3   Semaglutide, 2 MG/DOSE, (OZEMPIC, 2 MG/DOSE,) 8 MG/3ML SOPN    Sig: Inject 2 mg into the skin every 7 (seven) days.    Dispense:  3 mL    Refill:  3    Follow-up: Return in about 4 months (around 06/27/2023) for DM and HTN.    [name], MD

## 2023-02-26 NOTE — Assessment & Plan Note (Addendum)
BP Readings from Last 1 Encounters:  02/26/23 116/86   Well-controlled with amlodipine 5 mg daily Counseled for compliance with the medications Advised to follow DASH diet and perform moderate exercise/walking at least 150 minutes/week

## 2023-02-26 NOTE — Assessment & Plan Note (Signed)
Chest discomfort with tenderness likely due to costochondritis Ibuprofen as needed for pain If persistent, can prescribe oral steroid

## 2023-02-26 NOTE — Assessment & Plan Note (Addendum)
Had added Elavil, has improved sleep and myalgias-increased dose to 25 mg at bedtime to improve sleep quality On Cymbalta 60 mg QD and tramadol 50 mg PRN, will continue for now

## 2023-02-26 NOTE — Assessment & Plan Note (Signed)
On Zyrtec currently Uses Flonase Eye itchiness likely due to allergies as well

## 2023-02-27 LAB — CMP14+EGFR
ALT: 15 [IU]/L (ref 0–32)
AST: 19 [IU]/L (ref 0–40)
Albumin: 4 g/dL (ref 3.9–4.9)
Alkaline Phosphatase: 99 [IU]/L (ref 44–121)
BUN/Creatinine Ratio: 9 (ref 9–23)
BUN: 8 mg/dL (ref 6–20)
Bilirubin Total: <0.2 mg/dL (ref 0.0–1.2)
CO2: 21 mmol/L (ref 20–29)
Calcium: 8.8 mg/dL (ref 8.7–10.2)
Chloride: 103 mmol/L (ref 96–106)
Creatinine, Ser: 0.86 mg/dL (ref 0.57–1.00)
Globulin, Total: 2.9 g/dL (ref 1.5–4.5)
Glucose: 79 mg/dL (ref 70–99)
Potassium: 4.3 mmol/L (ref 3.5–5.2)
Sodium: 138 mmol/L (ref 134–144)
Total Protein: 6.9 g/dL (ref 6.0–8.5)
eGFR: 89 mL/min/{1.73_m2} (ref >59)

## 2023-02-27 LAB — CBC WITH DIFFERENTIAL/PLATELET
Basophils Absolute: 0 10*3/uL (ref 0.0–0.2)
Basos: 0 %
EOS (ABSOLUTE): 0.1 10*3/uL (ref 0.0–0.4)
Eos: 2 %
Hematocrit: 42.4 % (ref 34.0–46.6)
Hemoglobin: 13.1 g/dL (ref 11.1–15.9)
Immature Grans (Abs): 0 10*3/uL (ref 0.0–0.1)
Immature Granulocytes: 0 %
Lymphocytes Absolute: 3.2 10*3/uL — ABNORMAL HIGH (ref 0.7–3.1)
Lymphs: 38 %
MCH: 23.9 pg — ABNORMAL LOW (ref 26.6–33.0)
MCHC: 30.9 g/dL — ABNORMAL LOW (ref 31.5–35.7)
MCV: 78 fL — ABNORMAL LOW (ref 79–97)
Monocytes Absolute: 0.9 10*3/uL (ref 0.1–0.9)
Monocytes: 10 %
Neutrophils Absolute: 4.1 10*3/uL (ref 1.4–7.0)
Neutrophils: 50 %
Platelets: 361 10*3/uL (ref 150–450)
RBC: 5.47 x10E6/uL — ABNORMAL HIGH (ref 3.77–5.28)
RDW: 14.5 % (ref 11.7–15.4)
WBC: 8.3 10*3/uL (ref 3.4–10.8)

## 2023-02-27 LAB — HEMOGLOBIN A1C
Est. average glucose Bld gHb Est-mCnc: 117 mg/dL
Hgb A1c MFr Bld: 5.7 % — ABNORMAL HIGH (ref 4.8–5.6)

## 2023-03-03 ENCOUNTER — Other Ambulatory Visit: Payer: Self-pay | Admitting: Internal Medicine

## 2023-03-03 DIAGNOSIS — M797 Fibromyalgia: Secondary | ICD-10-CM

## 2023-03-30 ENCOUNTER — Other Ambulatory Visit: Payer: Self-pay

## 2023-03-30 ENCOUNTER — Encounter: Payer: Self-pay | Admitting: Internal Medicine

## 2023-03-30 DIAGNOSIS — E1169 Type 2 diabetes mellitus with other specified complication: Secondary | ICD-10-CM

## 2023-03-30 MED ORDER — BLOOD GLUCOSE TEST VI STRP: 1.0000 | ORAL_STRIP | Freq: Three times a day (TID) | 0 refills | Status: DC

## 2023-03-30 MED ORDER — BLOOD GLUCOSE MONITORING SUPPL DEVI: 1.0000 | Freq: Three times a day (TID) | 0 refills | Status: AC

## 2023-03-30 MED ORDER — LANCET DEVICE MISC: 1.0000 | Freq: Three times a day (TID) | 0 refills | Status: AC

## 2023-03-30 MED ORDER — LANCETS MISC. MISC: 1.0000 | Freq: Three times a day (TID) | 0 refills | Status: AC

## 2023-04-08 ENCOUNTER — Other Ambulatory Visit: Payer: Self-pay | Admitting: Adult Health

## 2023-04-10 ENCOUNTER — Other Ambulatory Visit: Payer: Self-pay | Admitting: Adult Health

## 2023-04-12 ENCOUNTER — Encounter: Payer: Self-pay | Admitting: Family Medicine

## 2023-04-13 ENCOUNTER — Other Ambulatory Visit: Payer: Self-pay | Admitting: Internal Medicine

## 2023-04-13 ENCOUNTER — Other Ambulatory Visit: Payer: Self-pay

## 2023-04-13 DIAGNOSIS — Z308 Encounter for other contraceptive management: Secondary | ICD-10-CM

## 2023-04-13 MED ORDER — NORETHINDRONE 0.35 MG PO TABS: 1.0000 | ORAL_TABLET | Freq: Every day | ORAL | 11 refills | Status: DC

## 2023-04-19 ENCOUNTER — Other Ambulatory Visit: Payer: Self-pay | Admitting: Family Medicine

## 2023-04-19 DIAGNOSIS — Z308 Encounter for other contraceptive management: Secondary | ICD-10-CM

## 2023-04-19 MED ORDER — NORETHINDRONE 0.35 MG PO TABS: 1.0000 | ORAL_TABLET | Freq: Every day | ORAL | 1 refills | Status: DC

## 2023-05-01 ENCOUNTER — Other Ambulatory Visit: Payer: Self-pay | Admitting: Internal Medicine

## 2023-05-01 DIAGNOSIS — E1169 Type 2 diabetes mellitus with other specified complication: Secondary | ICD-10-CM

## 2023-05-12 ENCOUNTER — Ambulatory Visit
Admission: RE | Admit: 2023-05-12 | Discharge: 2023-05-12 | Disposition: A | Discharge status: Home / Self Care | Payer: Medicaid Other | Source: Ambulatory Visit | Attending: Nurse Practitioner | Admitting: Nurse Practitioner

## 2023-05-12 VITALS — BP 100/71 | HR 95 | Temp 99.1°F | Resp 18

## 2023-05-12 DIAGNOSIS — Z8739 Personal history of other diseases of the musculoskeletal system and connective tissue: Secondary | ICD-10-CM | POA: Diagnosis not present

## 2023-05-12 DIAGNOSIS — R52 Pain, unspecified: Secondary | ICD-10-CM

## 2023-05-12 LAB — POC COVID19/FLU A&B COMBO
Covid Antigen, POC: NEGATIVE
Influenza A Antigen, POC: NEGATIVE
Influenza B Antigen, POC: NEGATIVE

## 2023-05-12 MED ORDER — KETOROLAC TROMETHAMINE 30 MG/ML IJ SOLN
30.0000 mg | Freq: Once | INTRAMUSCULAR | Status: AC
  Administered 2023-05-12: 30 mg via INTRAMUSCULAR

## 2023-05-12 NOTE — Discharge Instructions (Addendum)
Your symptoms are consistent with a flare of fibromyalgia.  We gave you an injection of Toradol today for the pain.  Avoid ibuprofen and all other NSAIDs for 48 hours.  You can take Tylenol 500 to 1000 mg every 6 hours as needed for pain.  Make sure you are drinking plenty of water.  Recommend follow-up with your PCP if symptoms do not improve with treatment.  COVID-19 and influenza testing is negative today.

## 2023-05-12 NOTE — ED Triage Notes (Signed)
Pt states she started having body aches and fatigue on Saturday. She did take IBU at 6am. She isn't sure if she is having a fibromyalgia flare or something else is going on .

## 2023-05-12 NOTE — ED Provider Notes (Signed)
[facility]    CSN: [id number] Arrival date & time: 05/12/23  1423      History   Chief Complaint Chief Complaint  Patient presents with   Generalized Body Aches    Back pain leg pain body pain and extreme fatigue headache and dizziness not sure if it's flu or fibromyalgia flare up. - Entered by patient   Fatigue   Dizziness    HPI Theresa Rosario is a 38 y.o. female.   Patient presents today with 3-day history of body pain all over, decreased appetite, and feeling like her food "goes right through me" when she eats.  She also endorses weakness and fatigue.  No fever or chills, cough, runny or stuffy nose, sore throat, headache, ear pain, abdominal pain, or vomiting.  No known sick contacts.  Has been taking ibuprofen for the pain which seems to help temporarily.  Reports her fibromyalgia doctor recently retired and she has not seen anybody for fibromyalgia.  She does have a primary care provider.  Reports the symptoms are typical of a fibromyalgia flare.  Patient reports she thinks she may be having a flare of fibromyalgia but is not sure.  She wanted to be tested for the flu today.    Past Medical History:  Diagnosis Date   Abnormal chromosomal and genetic finding on antenatal screening mother 02/06/2020   On Horizon Nov 2021     Silent carrier for alpha-thalassemia (FOB____)  and increased carrier risk for SMA  (FOB____)   Anemia 12/30/2018   Blood in urine 10/18/2015   Condyloma of female genitalia 10/21/2012   Subclitoral, TCA 7/31    Depression    Diabetes mellitus without complication (HCC)    Fibromyalgia    Hx of hematuria    Hx of ovarian cyst    Hypertension    Irregular bleeding 12/02/2017   Kidney stone    History of kidney stones   Large breasts    Lumbago 11/11/2011   Migraine     Patient Active Problem List   Diagnosis Date Noted   Encounter for other contraceptive management 04/13/2023   Costochondritis 02/26/2023   Diabetic eye exam  (HCC) 12/15/2022   Cervical cancer screening 12/03/2022   Rectal bleeding 10/27/2022   Chronic migraine w/o aura w/o status migrainosus, not intractable 07/17/2022   Vitamin D deficiency 06/24/2022   Allergic rhinitis 10/29/2021   Morbid obesity (HCC) 10/29/2021   Type 2 diabetes mellitus with other specified complication (HCC) 08/20/2021   Encounter for general adult medical examination with abnormal findings 04/02/2021   Irritant contact dermatitis 04/02/2021   History of nephrolithiasis 01/17/2021   Essential hypertension 01/24/2020   Insomnia 01/04/2020   Hyperpigmented skin lesion 07/07/2019   Obstructive sleep apnea of adult 06/08/2019   Chronic low back pain 06/08/2019   GERD (gastroesophageal reflux disease) 03/31/2019   Family history of colon cancer in mother 02/09/2019   IDA (iron deficiency anemia) 12/30/2018   Migraine 08/17/2018   Fibromyalgia 07/13/2012   Depression with anxiety 07/13/2012    Past Surgical History:  Procedure Laterality Date   DENTAL SURGERY     WISDOM TOOTH EXTRACTION Bilateral     OB History     Gravida  3   Para  2   Term  2   Preterm  0   AB  1   Living  1      SAB  1   IAB  0   Ectopic  0   Multiple  Live Births  1            Home Medications    Prior to Admission medications   Medication Sig Start Date End Date Taking? Authorizing Provider  amitriptyline (ELAVIL) 25 MG tablet Take 1 tablet (25 mg total) by mouth at bedtime. 02/26/23  Yes [name], MD  amLODipine (NORVASC) 5 MG tablet TAKE 1 TABLET(5 MG) BY MOUTH DAILY 02/23/23  Yes Cyril Mourning A, NP  Blood Glucose Monitoring Suppl DEVI 1 each by Does not apply route in the morning, at noon, and at bedtime. May substitute to any manufacturer covered by patient's insurance. 03/30/23   [name], MD  cetirizine (ZYRTEC) 10 MG tablet Take 1 tablet (10 mg total) by mouth daily. 10/27/22  Yes [name], MD  DULoxetine (CYMBALTA) 60 MG capsule  Take 1 capsule (60 mg total) by mouth daily. 10/27/22  Yes [name], MD  ibuprofen (ADVIL) 200 MG tablet Take 200 mg by mouth every 6 (six) hours as needed.   Yes [provider]  norethindrone (MICRONOR) 0.35 MG tablet Take 1 tablet (0.35 mg total) by mouth daily. 04/19/23  Yes Gilmore Laroche, FNP  Semaglutide, 2 MG/DOSE, (OZEMPIC, 2 MG/DOSE,) 8 MG/3ML SOPN Inject 2 mg into the skin every 7 (seven) days. 02/26/23  Yes [name], MD  Vitamin D, Ergocalciferol, (DRISDOL) 1.25 MG (50000 UNIT) CAPS capsule TAKE 1 CAPSULE BY MOUTH EVERY 7 DAYS 12/15/22  Yes [name], MD  ACCU-CHEK GUIDE TEST test strip USE TO TEST BLOOD GLUCOSE EVERY MORNING, AT NOON AND EVERY NIGHT AT BEDTIME 05/04/23   [name], MD  Accu-Chek Softclix Lancets lancets USE TO TEST BLOOD GLUCOSE EVERY MORNING, AT NOON AND EVERY NIGHT AT BEDTIME 05/04/23   [name], MD  acetaminophen (TYLENOL) 500 MG tablet Take 2 tablets (1,000 mg total) by mouth every 6 (six) hours. 07/25/20   Brand Males, CNM  clonazePAM (KLONOPIN) 0.5 MG tablet Take 0.5 mg by mouth 2 (two) times daily as needed. 12/07/20   [provider]  naratriptan (AMERGE) 2.5 MG tablet Take 1 tablet (2.5 mg total) by mouth as needed for migraine. Take one (1) tablet at onset of headache; if returns or does not resolve, may repeat after 4 hours; do not exceed five (5) mg in 24 hours. 12/03/22   Ihor Austin, NP  pantoprazole (PROTONIX) 40 MG tablet TAKE 1 TABLET(40 MG) BY MOUTH DAILY 08/11/22   [name], MD  Rimegepant Sulfate (NURTEC) 75 MG TBDP Take 1 tablet (75 mg total) by mouth daily as needed (for migraine). 05/26/22   [name], MD  rizatriptan (MAXALT-MLT) 10 MG disintegrating tablet Take 1 tablet (10 mg total) by mouth as needed. May repeat in 2 hours if needed 07/17/22   Levert Feinstein, MD  tiZANidine (ZANAFLEX) 4 MG tablet TAKE 1 TABLET(4 MG) BY MOUTH EVERY 6 HOURS AS NEEDED 02/12/23   Ihor Austin, NP  traMADol  (ULTRAM) 50 MG tablet Take 1 tablet (50 mg total) by mouth every 8 (eight) hours as needed. 07/10/22   [name], MD  propranolol ER (INDERAL LA) 60 MG 24 hr capsule Take 1 capsule (60 mg total) by mouth daily. 08/17/18 11/29/18  Levert Feinstein, MD  zonisamide (ZONEGRAN) 100 MG capsule Take 2 capsules (200 mg total) by mouth at bedtime. Patient not taking: Reported on 08/27/2018 08/17/18 11/29/18  Levert Feinstein, MD    Family History Family History  Problem  Relation Age of Onset   Diabetes Paternal Grandmother    Hypertension Paternal Grandmother    Diabetes Maternal Grandmother    Heart disease Maternal Grandfather    Hypertension Father    Diabetes Mother    Hypertension Mother    Parkinson's disease Mother    Colon cancer Mother 72   Coronary artery disease Other    Diabetes Other     Social History Social History   Tobacco Use   Smoking status: Former    Current packs/day: 0.00    Types: Cigarettes    Quit date: 09/22/2019    Years since quitting: 3.6   Smokeless tobacco: Never   Tobacco comments:    occasional vape  Vaping Use   Vaping status: Former  Substance Use Topics   Alcohol use: Not Currently    Comment: socially   Drug use: No     Allergies   Amoxicillin, Banana, Motrin [ibuprofen], Other, and Pineapple   Review of Systems Review of Systems Per HPI  Physical Exam Triage Vital Signs ED Triage Vitals  Encounter Vitals Group     BP 05/12/23 1456 100/71     Systolic BP Percentile --      Diastolic BP Percentile --      Pulse Rate 05/12/23 1456 95     Resp 05/12/23 1456 18     Temp 05/12/23 1456 99.1 F (37.3 C)     Temp Source 05/12/23 1456 Oral     SpO2 05/12/23 1456 97 %     Weight --      Height --      Head Circumference --      Peak Flow --      Pain Score 05/12/23 1454 0     Pain Loc --      Pain Education --      Exclude from Growth Chart --    No data found.  Updated Vital Signs BP 100/71 (BP Location: Right Arm)   Pulse 95   Temp  99.1 F (37.3 C) (Oral)   Resp 18   LMP 05/08/2023 (Approximate)   SpO2 97%   Visual Acuity Right Eye Distance:   Left Eye Distance:   Bilateral Distance:    Right Eye Near:   Left Eye Near:    Bilateral Near:     Physical Exam Vitals and nursing note reviewed.  Constitutional:      General: She is not in acute distress.    Appearance: Normal appearance. She is not ill-appearing or toxic-appearing.  HENT:     Head: Normocephalic and atraumatic.     Right Ear: Tympanic membrane, ear canal and external ear normal.     Left Ear: Tympanic membrane, ear canal and external ear normal.     Nose: No congestion or rhinorrhea.     Mouth/Throat:     Mouth: Mucous membranes are moist.     Pharynx: Oropharynx is clear. No oropharyngeal exudate or posterior oropharyngeal erythema.  Eyes:     General: No scleral icterus.    Extraocular Movements: Extraocular movements intact.  Cardiovascular:     Rate and Rhythm: Normal rate and regular rhythm.  Pulmonary:     Effort: Pulmonary effort is normal. No respiratory distress.     Breath sounds: Normal breath sounds. No wheezing, rhonchi or rales.  Musculoskeletal:     Cervical back: Normal range of motion and neck supple.  Lymphadenopathy:     Cervical: No cervical adenopathy.  Skin:  General: Skin is warm and dry.     Coloration: Skin is not jaundiced or pale.     Findings: No erythema or rash.  Neurological:     Mental Status: She is alert and oriented to person, place, and time.  Psychiatric:        Behavior: Behavior is cooperative.      UC Treatments / Results  Labs (all labs ordered are listed, but only abnormal results are displayed) Labs Reviewed  POC COVID19/FLU A&B COMBO    EKG   Radiology No results found.  Procedures Procedures (including critical care time)  Medications Ordered in UC Medications  ketorolac (TORADOL) 30 MG/ML injection 30 mg (30 mg Intramuscular Given 05/12/23 1542)    Initial  Impression / Assessment and Plan / UC Course  I have reviewed the triage vital signs and the nursing notes.  Pertinent labs & imaging results that were available during my care of the patient were reviewed by me and considered in my medical decision making (see chart for details).   Patient is well-appearing, normotensive, afebrile, not tachycardic, not tachypneic, oxygenating well on room air.    1. Body aches 2. History of fibromyalgia Vitals and exam are reassuring today COVID-19, influenza testing is negative Suspect fibromyalgia flare Will treat with Toradol 30 mg IM in urgent care today, avoid NSAIDs for 48 hours, start Tylenol 500 to 1000 mg every 6 hours for pain Recommended follow-up with PCP if symptoms do not improve with treatment  The patient was given the opportunity to ask questions.  All questions answered to their satisfaction.  The patient is in agreement to this plan.    Final Clinical Impressions(s) / UC Diagnoses   Final diagnoses:  Body aches  History of fibromyalgia     Discharge Instructions      Your symptoms are consistent with a flare of fibromyalgia.  We gave you an injection of Toradol today for the pain.  Avoid ibuprofen and all other NSAIDs for 48 hours.  You can take Tylenol 500 to 1000 mg every 6 hours as needed for pain.  Make sure you are drinking plenty of water.  Recommend follow-up with your PCP if symptoms do not improve with treatment.  COVID-19 and influenza testing is negative today.     ED Prescriptions   None    PDMP not reviewed this encounter.   Valentino Nose, NP 05/12/23 [phone]

## 2023-06-08 ENCOUNTER — Other Ambulatory Visit: Payer: Self-pay | Admitting: Internal Medicine

## 2023-06-08 DIAGNOSIS — E1169 Type 2 diabetes mellitus with other specified complication: Secondary | ICD-10-CM

## 2023-06-11 ENCOUNTER — Ambulatory Visit: Admitting: Internal Medicine

## 2023-06-11 ENCOUNTER — Encounter: Payer: Self-pay | Admitting: Internal Medicine

## 2023-06-11 ENCOUNTER — Ambulatory Visit: Payer: Self-pay

## 2023-06-11 VITALS — BP 116/74 | HR 92 | Ht 61.0 in | Wt 228.6 lb

## 2023-06-11 DIAGNOSIS — M5442 Lumbago with sciatica, left side: Secondary | ICD-10-CM

## 2023-06-11 DIAGNOSIS — G8929 Other chronic pain: Secondary | ICD-10-CM

## 2023-06-11 DIAGNOSIS — G43E19 Chronic migraine with aura, intractable, without status migrainosus: Secondary | ICD-10-CM

## 2023-06-11 DIAGNOSIS — M5441 Lumbago with sciatica, right side: Secondary | ICD-10-CM

## 2023-06-11 DIAGNOSIS — M797 Fibromyalgia: Secondary | ICD-10-CM | POA: Diagnosis not present

## 2023-06-11 DIAGNOSIS — E538 Deficiency of other specified B group vitamins: Secondary | ICD-10-CM | POA: Diagnosis not present

## 2023-06-11 DIAGNOSIS — E1140 Type 2 diabetes mellitus with diabetic neuropathy, unspecified: Secondary | ICD-10-CM | POA: Diagnosis not present

## 2023-06-11 MED ORDER — CELECOXIB 200 MG PO CAPS: 200.0000 mg | ORAL_CAPSULE | Freq: Two times a day (BID) | ORAL | 0 refills | Status: DC

## 2023-06-11 MED ORDER — CYANOCOBALAMIN 1000 MCG/ML IJ SOLN
1000.0000 ug | Freq: Once | INTRAMUSCULAR | Status: AC
  Administered 2023-06-11: 1000 ug via INTRAMUSCULAR

## 2023-06-11 NOTE — Assessment & Plan Note (Signed)
 Has chronic low back pain, can cause radicular symptoms in LE Celecoxib as needed for back pain Continue Cymbalta Avoid heavy lifting and frequent bending

## 2023-06-11 NOTE — Telephone Encounter (Signed)
 noted

## 2023-06-11 NOTE — Assessment & Plan Note (Signed)
 Well controlled currently Migraine can also cause intermittent numbness and tingling Has Maxalt and Nurtec as needed for breakthrough headache Elavil has likely improved her migraine frequency Followed by neurology

## 2023-06-11 NOTE — Patient Instructions (Signed)
 Please start taking Vitamin B12 500 mcg once daily.  Please continue taking Cymbalta and Amitriptyline.  If you have persistent numbness and tingling of the hands or feet, please contact Neurology office as well.

## 2023-06-11 NOTE — Assessment & Plan Note (Signed)
 Subjective feeling of numbness and tingling of bilateral hands and feet Could be due to diabetic neuropathy, continue Cymbalta and Elavil for now Used to take gabapentin, but was held due to hypersensitivity reaction Check CBC, CMP, TSH, B12 levels

## 2023-06-11 NOTE — Progress Notes (Signed)
 Acute Office Visit  Subjective:    Patient ID: Theresa Rosario, female    DOB: [birth year], 38 y.o.   MRN: [id number]  Chief Complaint  Patient presents with   Pain    Pt reports pain in her hands and in feet when walking. Ongoing for about a week.     HPI Patient is in today for complaint of burning pain in bilateral hands and feet.  She reports tingling and pins and prick like sensations in feet for the last 1 week, worse with standing and walking.  Of note, she has chronic low back pain, but denies any recent worsening of the back pain.  She has history of fibromyalgia, takes amitriptyline 25 mg QD and Cymbalta 60 mg QD.  She also has history of migraine, but has been overall better controlled since starting amitriptyline.  She currently denies headache.  She also has history of type II DM.  Past Medical History:  Diagnosis Date   Abnormal chromosomal and genetic finding on antenatal screening mother 02/06/2020   On Horizon Nov 2021     Silent carrier for alpha-thalassemia (FOB____)  and increased carrier risk for SMA  (FOB____)   Anemia 12/30/2018   Blood in urine 10/18/2015   Condyloma of female genitalia 10/21/2012   Subclitoral, TCA 7/31    Depression    Diabetes mellitus without complication (HCC)    Fibromyalgia    Hx of hematuria    Hx of ovarian cyst    Hypertension    Irregular bleeding 12/02/2017   Kidney stone    History of kidney stones   Large breasts    Lumbago 11/11/2011   Migraine     Past Surgical History:  Procedure Laterality Date   DENTAL SURGERY     WISDOM TOOTH EXTRACTION Bilateral     Family History  Problem Relation Age of Onset   Diabetes Paternal Grandmother    Hypertension Paternal Grandmother    Diabetes Maternal Grandmother    Heart disease Maternal Grandfather    Hypertension Father    Diabetes Mother    Hypertension Mother    Parkinson's disease Mother    Colon cancer Mother 21   Coronary artery disease Other    Diabetes Other      Social History   Socioeconomic History   Marital status: Significant Other    Spouse name: Not on file   Number of children: 2   Years of education: college   Highest education level: Master's degree (e.g., MA, MS, MEng, MEd, MSW, MBA)  Occupational History   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 Right-handed. 2-3 cups caffeine per day.   Social Drivers of Health   Financial Resource Strain: Low Risk  (08/29/2020)   Overall Financial Resource Strain (CARDIA)    Difficulty of  Paying Living Expenses: Not very hard  Food Insecurity: No Food Insecurity (08/29/2020)   Hunger Vital Sign    Worried About Running Out of Food in the Last Year: Never true    Ran Out of Food in the Last Year: Never true  Transportation Needs: No Transportation Needs (08/29/2020)   PRAPARE - Administrator, Civil Service (Medical): No    Lack of Transportation (Non-Medical): No  Physical Activity: Inactive (08/29/2020)   Exercise Vital Sign    Days of Exercise per Week: 0 days    Minutes of Exercise per Session: 0 min  Stress: No Stress Concern Present (08/29/2020)   [employer] of Occupational Health - Occupational Stress Questionnaire    Feeling of Stress : Only a little  Social Connections: Moderately Isolated (08/29/2020)   Social Connection and Isolation Panel [NHANES]    Frequency of Communication with Friends and Family: Once a  week    Frequency of Social Gatherings with Friends and Family: Never    Attends Religious Services: 1 to 4 times per year    Active Member of [employer] or Organizations: No    Attends Banker Meetings: Never    Marital Status: Living with partner  Intimate Partner Violence: Not At Risk (08/29/2020)   Humiliation, Afraid, Rape, and Kick questionnaire    Fear of Current or Ex-Partner: No    Emotionally Abused: No    Physically Abused: No    Sexually Abused: No    Outpatient Medications Prior to Visit  Medication Sig Dispense Refill   ACCU-CHEK GUIDE TEST test strip USE TO TEST BLOOD GLUCOSE EVERY MORNING, AT NOON AND EVERY NIGHT AT BEDTIME 100 strip 0   Accu-Chek Softclix Lancets lancets USE TO TEST BLOOD GLUCOSE EVERY MORNING, AT NOON AND EVERY NIGHT AT BEDTIME 100 each 0   acetaminophen (TYLENOL) 500 MG tablet Take 2 tablets (1,000 mg total) by mouth every 6 (six) hours. 30 tablet 0   amitriptyline (ELAVIL) 25 MG tablet Take 1 tablet (25 mg total) by mouth at bedtime. 30 tablet 3   amLODipine (NORVASC) 5 MG tablet TAKE 1 TABLET(5 MG) BY MOUTH DAILY 30 tablet 6   Blood Glucose Monitoring Suppl DEVI 1 each by Does not apply route in the morning, at noon, and at bedtime. May substitute to any manufacturer covered by patient's insurance. 1 each 0   cetirizine (ZYRTEC) 10 MG tablet Take 1 tablet (10 mg total) by mouth daily. 30 tablet 5   clonazePAM (KLONOPIN) 0.5 MG tablet Take 0.5 mg by mouth 2 (two) times daily as needed.     DULoxetine (CYMBALTA) 60 MG capsule Take 1 capsule (60 mg total) by mouth daily. 30 capsule 3   naratriptan (AMERGE) 2.5 MG tablet Take 1 tablet (2.5 mg total) by mouth as needed for migraine. Take one (1) tablet at onset of headache; if returns or does not resolve, may repeat after 4 hours; do not exceed five (5) mg in 24 hours. 10 tablet 11   norethindrone (MICRONOR) 0.35 MG tablet Take 1 tablet (0.35 mg total) by mouth daily. 84 tablet 1   pantoprazole  (PROTONIX) 40 MG tablet TAKE 1 TABLET(40 MG) BY MOUTH DAILY 90 tablet 1   Rimegepant Sulfate (NURTEC) 75 MG TBDP Take 1 tablet (75 mg total) by mouth daily as needed (for migraine). 16 tablet 1   rizatriptan (MAXALT-MLT) 10 MG disintegrating tablet Take 1 tablet (10 mg total) by mouth as needed. May repeat in 2 hours if  needed 12 tablet 6   Semaglutide, 2 MG/DOSE, (OZEMPIC, 2 MG/DOSE,) 8 MG/3ML SOPN Inject 2 mg into the skin every 7 (seven) days. 3 mL 3   tiZANidine (ZANAFLEX) 4 MG tablet TAKE 1 TABLET(4 MG) BY MOUTH EVERY 6 HOURS AS NEEDED 30 tablet 9   traMADol (ULTRAM) 50 MG tablet Take 1 tablet (50 mg total) by mouth every 8 (eight) hours as needed. 30 tablet 0   Vitamin D, Ergocalciferol, (DRISDOL) 1.25 MG (50000 UNIT) CAPS capsule TAKE 1 CAPSULE BY MOUTH EVERY 7 DAYS 12 capsule 1   ibuprofen (ADVIL) 200 MG tablet Take 200 mg by mouth every 6 (six) hours as needed.     No facility-administered medications prior to visit.    Allergies  Allergen Reactions   Amoxicillin Hives   Banana Itching    Tongue itching   Motrin [Ibuprofen] Rash    MOTRIN (Brand only) causes this allergy (tolerates ADVIL brand)   Other Itching    Tree nuts cause tongue itching    Pineapple Itching    Tongue itching    Review of Systems  Constitutional:  Negative for chills and fever.  HENT:  Negative for sinus pressure, sinus pain and sore throat.   Eyes:  Negative for pain and discharge.  Respiratory:  Negative for cough and shortness of breath.   Cardiovascular:  Negative for chest pain and palpitations.  Gastrointestinal:  Positive for constipation. Negative for diarrhea, nausea and vomiting.  Endocrine: Negative for polydipsia and polyuria.  Genitourinary:  Negative for dysuria and hematuria.  Musculoskeletal:  Positive for arthralgias, back pain and myalgias. Negative for neck pain and neck stiffness.  Skin:  Negative for rash.  Neurological:  Positive for numbness (Bilateral hands and feet) and  headaches. Negative for dizziness, syncope and weakness.  Psychiatric/Behavioral:  Positive for sleep disturbance. Negative for agitation and behavioral problems.        Objective:    Physical Exam Vitals reviewed.  Constitutional:      General: She is not in acute distress.    Appearance: She is obese. She is not diaphoretic.  HENT:     Head: Normocephalic and atraumatic.     Nose: Nose normal.     Mouth/Throat:     Mouth: Mucous membranes are moist.  Eyes:     General: No scleral icterus.    Extraocular Movements: Extraocular movements intact.  Cardiovascular:     Rate and Rhythm: Normal rate and regular rhythm.     Heart sounds: Normal heart sounds. No murmur heard. Pulmonary:     Breath sounds: Normal breath sounds. No wheezing or rales.  Musculoskeletal:     Cervical back: Neck supple. No tenderness.     Right lower leg: No edema.     Left lower leg: No edema.  Skin:    General: Skin is warm.     Findings: No rash.  Neurological:     General: No focal deficit present.     Mental Status: She is alert and oriented to person, place, and time.     Sensory: No sensory deficit.     Motor: No weakness.  Psychiatric:        Mood and Affect: Mood normal.        Behavior: Behavior normal.     BP 116/74   Pulse 92   Ht 5\' 1"  (1.549 m)   Wt 228 lb 9.6 oz (103.7 kg)   LMP 05/08/2023 (Approximate)   SpO2 98%   BMI 43.19  kg/m  Wt Readings from Last 3 Encounters:  06/11/23 228 lb 9.6 oz (103.7 kg)  02/26/23 224 lb 3.2 oz (101.7 kg)  12/03/22 225 lb 1.9 oz (102.1 kg)        Assessment & Plan:   Problem List Items Addressed This Visit       Cardiovascular and Mediastinum   Migraine   Well controlled currently Migraine can also cause intermittent numbness and tingling Has Maxalt and Nurtec as needed for breakthrough headache Elavil has likely improved her migraine frequency Followed by neurology      Relevant Medications   celecoxib (CELEBREX) 200 MG  capsule   Other Relevant Orders   CBC with Differential/Platelet   CMP14+EGFR     Endocrine   Type 2 diabetes mellitus with diabetic neuropathy, without long-term current use of insulin (HCC)   Subjective feeling of numbness and tingling of bilateral hands and feet Could be due to diabetic neuropathy, continue Cymbalta and Elavil for now Used to take gabapentin, but was held due to hypersensitivity reaction Check CBC, CMP, TSH, B12 levels      Relevant Orders   CMP14+EGFR   TSH   Hemoglobin A1c     Other   Fibromyalgia - Primary   Had added Elavil, has improved sleep and myalgias- later increased dose to 25 mg at bedtime to improve sleep quality On Cymbalta 60 mg QD and tramadol 50 mg PRN, will continue for now      Relevant Medications   celecoxib (CELEBREX) 200 MG capsule   Chronic low back pain   Has chronic low back pain, can cause radicular symptoms in LE Celecoxib as needed for back pain Continue Cymbalta Avoid heavy lifting and frequent bending      Relevant Medications   celecoxib (CELEBREX) 200 MG capsule   B12 deficiency   B12 deficiency can give neuropathic symptoms, check B12 level B12 injection today      Relevant Orders   B12     Meds ordered this encounter  Medications   celecoxib (CELEBREX) 200 MG capsule    Sig: Take 1 capsule (200 mg total) by mouth 2 (two) times daily.    Dispense:  30 capsule    Refill:  0   cyanocobalamin (VITAMIN B12) injection 1,000 mcg     [name], MD

## 2023-06-11 NOTE — Assessment & Plan Note (Signed)
 Had added Elavil, has improved sleep and myalgias- later increased dose to 25 mg at bedtime to improve sleep quality On Cymbalta 60 mg QD and tramadol 50 mg PRN, will continue for now

## 2023-06-11 NOTE — Telephone Encounter (Signed)
 Copied from CRM [id number]. Topic: Appointments - Appointment Scheduling >> Jun 11, 2023  1:10 PM Morrie Sheldon D wrote: Reason for EAV:[id number] called stating she has increase pain in her hands and feet when she walks. Patient states it is sharp, tingling and burning. Patient also states it is the first time it has happen in her face and eyes and It last for 15 minutes  Chief Complaint: pain in hands and feet when she walks; states face is also burning Symptoms: see above Frequency: comes and goes Pertinent Negatives: Patient denies cp, numbness, sob, difficulty walking Disposition: [] ED /[] Urgent Care (no appt availability in office) / [x] Appointment(In office/virtual)/ []  [facility]/ [] Home Care/ [] Refused Recommended Disposition /[] [facility]/ []  Follow-up with PCP Additional Notes: apt made for today per protocol. Care advice given, denies questions; instructed to go to ER if becomes worse.   Reason for Disposition  [1] Tingling (e.g., pins and needles) of the face, arm / hand, or leg / foot on one side of the body AND [2] present now (Exceptions: Chronic or recurrent symptom lasting > 4 weeks; or tingling from known cause, such as: bumped elbow, carpal tunnel syndrome, pinched nerve, frostbite.)  Answer Assessment - Initial Assessment Questions 1. SYMPTOM: "What is the main symptom you are concerned about?" (e.g., weakness, numbness)     Pain, in hands and feet when she walks 2. ONSET: "When did this start?" (minutes, hours, days; while sleeping)     Today; lasted 15 minutes 3. LAST NORMAL: "When was the last time you (the patient) were normal (no symptoms)?"     yesterday 4. PATTERN "Does this come and go, or has it been constant since it started?"  "Is it present now?"     Comes and goes 5. CARDIAC SYMPTOMS: "Have you had any of the following symptoms: chest pain, difficulty breathing, palpitations?"     denies 6. NEUROLOGIC SYMPTOMS: "Have you had any of the following  symptoms: headache, dizziness, vision loss, double vision, changes in speech, unsteady on your feet?"     denies 7. OTHER SYMPTOMS: "Do you have any other symptoms?"     Face feels like it is burning 8. PREGNANCY: "Is there any chance you are pregnant?" "When was your last menstrual period?"     denies  Protocols used: Neurologic Deficit-A-AH

## 2023-06-11 NOTE — Assessment & Plan Note (Signed)
 B12 deficiency can give neuropathic symptoms, check B12 level B12 injection today

## 2023-06-12 ENCOUNTER — Other Ambulatory Visit: Payer: Self-pay | Admitting: Internal Medicine

## 2023-06-12 DIAGNOSIS — E538 Deficiency of other specified B group vitamins: Secondary | ICD-10-CM

## 2023-06-12 LAB — HEMOGLOBIN A1C
Est. average glucose Bld gHb Est-mCnc: 117 mg/dL
Hgb A1c MFr Bld: 5.7 % — ABNORMAL HIGH (ref 4.8–5.6)

## 2023-06-12 LAB — CMP14+EGFR
ALT: 15 IU/L (ref 0–32)
AST: 19 IU/L (ref 0–40)
Albumin: 4.3 g/dL (ref 3.9–4.9)
Alkaline Phosphatase: 105 IU/L (ref 44–121)
BUN/Creatinine Ratio: 14 (ref 9–23)
BUN: 10 mg/dL (ref 6–20)
Bilirubin Total: <0.2 mg/dL (ref 0.0–1.2)
CO2: 24 mmol/L (ref 20–29)
Calcium: 9.3 mg/dL (ref 8.7–10.2)
Chloride: 102 mmol/L (ref 96–106)
Creatinine, Ser: 0.74 mg/dL (ref 0.57–1.00)
Globulin, Total: 2.9 g/dL (ref 1.5–4.5)
Glucose: 70 mg/dL (ref 70–99)
Potassium: 4.2 mmol/L (ref 3.5–5.2)
Sodium: 140 mmol/L (ref 134–144)
Total Protein: 7.2 g/dL (ref 6.0–8.5)
eGFR: 107 mL/min/{1.73_m2} (ref >59)

## 2023-06-12 LAB — CBC WITH DIFFERENTIAL/PLATELET
Basophils Absolute: 0 10*3/uL (ref 0.0–0.2)
Basos: 0 %
EOS (ABSOLUTE): 0.1 10*3/uL (ref 0.0–0.4)
Eos: 2 %
Hematocrit: 42.7 % (ref 34.0–46.6)
Hemoglobin: 13.4 g/dL (ref 11.1–15.9)
Immature Grans (Abs): 0 10*3/uL (ref 0.0–0.1)
Immature Granulocytes: 0 %
Lymphocytes Absolute: 2.6 10*3/uL (ref 0.7–3.1)
Lymphs: 35 %
MCH: 24.1 pg — ABNORMAL LOW (ref 26.6–33.0)
MCHC: 31.4 g/dL — ABNORMAL LOW (ref 31.5–35.7)
MCV: 77 fL — ABNORMAL LOW (ref 79–97)
Monocytes Absolute: 0.6 10*3/uL (ref 0.1–0.9)
Monocytes: 7 %
Neutrophils Absolute: 4.3 10*3/uL (ref 1.4–7.0)
Neutrophils: 56 %
Platelets: 383 10*3/uL (ref 150–450)
RBC: 5.55 x10E6/uL — ABNORMAL HIGH (ref 3.77–5.28)
RDW: 14.4 % (ref 11.7–15.4)
WBC: 7.7 10*3/uL (ref 3.4–10.8)

## 2023-06-12 LAB — VITAMIN B12: Vitamin B-12: 270 pg/mL (ref 232–1245)

## 2023-06-12 LAB — TSH: TSH: 1.59 u[IU]/mL (ref 0.450–4.500)

## 2023-06-12 MED ORDER — VITAMIN B-12 500 MCG PO TABS: 500.0000 ug | ORAL_TABLET | Freq: Every day | ORAL | 1 refills | Status: AC

## 2023-06-15 ENCOUNTER — Encounter: Payer: Self-pay | Admitting: Internal Medicine

## 2023-06-15 ENCOUNTER — Other Ambulatory Visit: Payer: Self-pay

## 2023-06-15 DIAGNOSIS — M797 Fibromyalgia: Secondary | ICD-10-CM

## 2023-06-15 MED ORDER — DULOXETINE HCL 60 MG PO CPEP: 60.0000 mg | ORAL_CAPSULE | Freq: Every day | ORAL | 0 refills | Status: DC

## 2023-06-30 ENCOUNTER — Other Ambulatory Visit: Payer: Self-pay | Admitting: Internal Medicine

## 2023-06-30 DIAGNOSIS — M797 Fibromyalgia: Secondary | ICD-10-CM

## 2023-07-02 ENCOUNTER — Ambulatory Visit: Payer: Medicaid Other | Admitting: Internal Medicine

## 2023-07-02 ENCOUNTER — Encounter: Payer: Self-pay | Admitting: Internal Medicine

## 2023-07-02 VITALS — BP 122/62 | HR 86 | Ht 61.0 in | Wt 229.0 lb

## 2023-07-02 DIAGNOSIS — M797 Fibromyalgia: Secondary | ICD-10-CM | POA: Diagnosis not present

## 2023-07-02 DIAGNOSIS — R200 Anesthesia of skin: Secondary | ICD-10-CM | POA: Insufficient documentation

## 2023-07-02 DIAGNOSIS — R202 Paresthesia of skin: Secondary | ICD-10-CM | POA: Insufficient documentation

## 2023-07-02 DIAGNOSIS — G43E19 Chronic migraine with aura, intractable, without status migrainosus: Secondary | ICD-10-CM | POA: Diagnosis not present

## 2023-07-02 DIAGNOSIS — Z0001 Encounter for general adult medical examination with abnormal findings: Secondary | ICD-10-CM

## 2023-07-02 DIAGNOSIS — E538 Deficiency of other specified B group vitamins: Secondary | ICD-10-CM

## 2023-07-02 DIAGNOSIS — I10 Essential (primary) hypertension: Secondary | ICD-10-CM | POA: Diagnosis not present

## 2023-07-02 MED ORDER — TRAMADOL HCL 50 MG PO TABS: 50.0000 mg | ORAL_TABLET | Freq: Three times a day (TID) | ORAL | 0 refills | Status: DC | PRN

## 2023-07-02 MED ORDER — CYANOCOBALAMIN 1000 MCG/ML IJ SOLN
1000.0000 ug | Freq: Once | INTRAMUSCULAR | Status: AC
  Administered 2023-07-02: 1000 ug via INTRAMUSCULAR

## 2023-07-02 NOTE — Progress Notes (Signed)
 Established Patient Office Visit  Subjective:  Patient ID: Theresa Rosario, female    DOB: Feb 05, 1986  Age: 38 y.o. MRN: [id number]  CC:  Chief Complaint  Patient presents with   Care Management    4 month f/u, pt reports sx of pain neck, back and leg.    Annual Exam    HPI Theresa Rosario is a 38 y.o. female with past medical history of hypertension, type 2 DM, fibromyalgia, insomnia, OSA and morbid obesity who presents for f/u of her chronic medical conditions.  She still has  burning pain in bilateral hands and feet.  She reports tingling and pins and prick like sensations in feet for the last 1 month, worse with standing and walking. She has noticed dizzy spells as well.  Of note, she has chronic low back pain, but denies any recent worsening of the back pain.   HTN: BP is well-controlled. Takes amlodipine 5 mg QD regularly. Patient denies chest pain, dyspnea or palpitations.  Type II DM: Her HbA1C was 5.6 in 08/24.  She has started taking Ozempic 2 mg qw and has been tolerating it well.  She had lost about 32 lbs since starting Ozempic, but has been overall stable since the last visit.  She has been trying to follow low-carb diet, but admits that she still needs to improve her diet.  She has chronic fatigue, but denies any polyuria or polyphagia currently.  Fibromyalgia: She has been taking Elavil 25 mg QD and has noticed improvement in her sleep, myalgias and her migraine frequency has also improved.  She still reports sleep interruptions.  She is also taking tizanidine 4 mg nightly and Cymbalta 60 mg daily.  She takes tramadol for acute flareups, and needs it only on few days in a month. She has seen [facility] neurology for migraine, but does not have f/u.  She has clonazepam PRN for anxiety.  She has tried BuSpar in the past.  She has hair pulling episodes during anxiety spells.  Denies any SI or HI currently.   Past Medical History:  Diagnosis Date   Abnormal  chromosomal and genetic finding on antenatal screening mother 02/06/2020   On [facility] Nov 2021     Silent carrier for alpha-thalassemia (FOB____)  and increased carrier risk for SMA  (FOB____)   Anemia 12/30/2018   Blood in urine 10/18/2015   Condyloma of female genitalia 10/21/2012   Subclitoral, TCA 7/31    Depression    Diabetes mellitus without complication (HCC)    Fibromyalgia    Hx of hematuria    Hx of ovarian cyst    Hypertension    Irregular bleeding 12/02/2017   Kidney stone    History of kidney stones   Large breasts    Lumbago 11/11/2011   Migraine     Past Surgical History:  Procedure Laterality Date   DENTAL SURGERY     WISDOM TOOTH EXTRACTION Bilateral     Family History  Problem Relation Age of Onset   Diabetes Paternal Grandmother    Hypertension Paternal Grandmother    Diabetes Maternal Grandmother    Heart disease Maternal Grandfather    Hypertension Father    Diabetes Mother    Hypertension Mother    Parkinson's disease Mother    Colon cancer Mother 57   Coronary artery disease Other    Diabetes Other     Social History   Socioeconomic History   Marital status: Significant Other    Spouse name:  Not on file   Number of children: 2   Years of education: college   Highest education level: Master's degree (e.g., MA, MS, MEng, MEd, MSW, MBA)  Occupational History   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 Right-handed. 2-3 cups caffeine per day.   Social Drivers of Corporate investment banker Strain: Low Risk  (08/29/2020)   Overall Financial Resource Strain (CARDIA)    Difficulty of Paying Living Expenses: Not very hard  Food Insecurity: No Food Insecurity (08/29/2020)   Hunger Vital Sign    Worried About Running Out of Food in the Last Year: Never true    Ran Out of Food in the Last Year: Never true  Transportation Needs: No Transportation Needs (08/29/2020)   PRAPARE - Administrator, Civil Service (Medical): No    Lack of Transportation (Non-Medical): No  Physical Activity: Inactive (08/29/2020)   Exercise Vital Sign    Days of Exercise per Week: 0 days    Minutes of Exercise per Session: 0 min  Stress: No Stress Concern Present (08/29/2020)   [employer] of Occupational Health - Occupational Stress Questionnaire    Feeling of Stress : Only a little  Social Connections: Moderately Isolated (08/29/2020)   Social Connection and Isolation Panel [NHANES]    Frequency of Communication with Friends and Family: Once a week    Frequency of Social Gatherings with Friends and Family: Never    Attends Religious Services: 1 to 4 times per year    Active Member of [employer] or Organizations: No    Attends Banker Meetings: Never    Marital Status: Living with partner  Intimate Partner Violence: Not At Risk (08/29/2020)   Humiliation, Afraid, Rape, and Kick questionnaire    Fear of Current or Ex-Partner: No    Emotionally Abused: No    Physically Abused: No    Sexually Abused: No    Outpatient Medications Prior to Visit  Medication Sig Dispense Refill   ACCU-CHEK GUIDE TEST test strip USE TO TEST BLOOD GLUCOSE EVERY MORNING, AT NOON AND EVERY NIGHT AT BEDTIME 100 strip 0   Accu-Chek Softclix Lancets lancets USE TO TEST BLOOD GLUCOSE EVERY MORNING, AT NOON AND EVERY NIGHT AT BEDTIME 100 each 0   acetaminophen (TYLENOL) 500 MG tablet Take 2 tablets (1,000 mg  total) by mouth every 6 (six) hours. 30 tablet 0   amitriptyline (ELAVIL) 25 MG tablet TAKE 1 TABLET(25 MG) BY MOUTH AT BEDTIME 30 tablet 3   amLODipine (NORVASC) 5 MG tablet TAKE 1 TABLET(5 MG) BY MOUTH DAILY 30 tablet 6   Blood Glucose Monitoring Suppl DEVI 1 each by Does not apply route in the morning, at noon, and at bedtime. May substitute to any manufacturer covered by patient's insurance. 1 each 0  celecoxib (CELEBREX) 200 MG capsule Take 1 capsule (200 mg total) by mouth 2 (two) times daily. 30 capsule 0   cetirizine (ZYRTEC) 10 MG tablet Take 1 tablet (10 mg total) by mouth daily. 30 tablet 5   clonazePAM (KLONOPIN) 0.5 MG tablet Take 0.5 mg by mouth 2 (two) times daily as needed.     DULoxetine (CYMBALTA) 60 MG capsule Take 1 capsule (60 mg total) by mouth daily. 90 capsule 0   naratriptan (AMERGE) 2.5 MG tablet Take 1 tablet (2.5 mg total) by mouth as needed for migraine. Take one (1) tablet at onset of headache; if returns or does not resolve, may repeat after 4 hours; do not exceed five (5) mg in 24 hours. 10 tablet 11   norethindrone (MICRONOR) 0.35 MG tablet Take 1 tablet (0.35 mg total) by mouth daily. 84 tablet 1   pantoprazole (PROTONIX) 40 MG tablet TAKE 1 TABLET(40 MG) BY MOUTH DAILY 90 tablet 1   Rimegepant Sulfate (NURTEC) 75 MG TBDP Take 1 tablet (75 mg total) by mouth daily as needed (for migraine). 16 tablet 1   rizatriptan (MAXALT-MLT) 10 MG disintegrating tablet Take 1 tablet (10 mg total) by mouth as needed. May repeat in 2 hours if needed 12 tablet 6   Semaglutide, 2 MG/DOSE, (OZEMPIC, 2 MG/DOSE,) 8 MG/3ML SOPN Inject 2 mg into the skin every 7 (seven) days. 3 mL 3   tiZANidine (ZANAFLEX) 4 MG tablet TAKE 1 TABLET(4 MG) BY MOUTH EVERY 6 HOURS AS NEEDED 30 tablet 9   vitamin B-12 (CYANOCOBALAMIN) 500 MCG tablet Take 1 tablet (500 mcg total) by mouth daily. 100 tablet 1   Vitamin D, Ergocalciferol, (DRISDOL) 1.25 MG (50000 UNIT) CAPS capsule TAKE 1 CAPSULE BY MOUTH EVERY 7  DAYS 12 capsule 1   traMADol (ULTRAM) 50 MG tablet Take 1 tablet (50 mg total) by mouth every 8 (eight) hours as needed. 30 tablet 0   No facility-administered medications prior to visit.    Allergies  Allergen Reactions   Amoxicillin Hives   Banana Itching    Tongue itching   Motrin [Ibuprofen] Rash    MOTRIN (Brand only) causes this allergy (tolerates ADVIL brand)   Other Itching    Tree nuts cause tongue itching    Pineapple Itching    Tongue itching    ROS Review of Systems  Constitutional:  Negative for chills and fever.  HENT:  Negative for sinus pressure, sinus pain and sore throat.   Eyes:  Negative for pain and discharge.  Respiratory:  Negative for cough and shortness of breath.   Cardiovascular:  Negative for chest pain and palpitations.  Gastrointestinal:  Positive for constipation. Negative for diarrhea, nausea and vomiting.  Endocrine: Negative for polydipsia and polyuria.  Genitourinary:  Negative for dysuria and hematuria.  Musculoskeletal:  Positive for arthralgias, back pain and myalgias. Negative for neck pain and neck stiffness.  Skin:  Negative for rash.  Neurological:  Positive for dizziness, numbness (B/l hands and feet) and headaches. Negative for syncope and weakness.  Psychiatric/Behavioral:  Positive for sleep disturbance. Negative for agitation and behavioral problems.       Objective:    Physical Exam Vitals reviewed.  Constitutional:      General: She is not in acute distress.    Appearance: She is obese. She is not diaphoretic.  HENT:     Head: Normocephalic and atraumatic.     Nose: Nose normal.     Mouth/Throat:     Mouth: Mucous  membranes are moist.  Eyes:     General: No scleral icterus.    Extraocular Movements: Extraocular movements intact.  Cardiovascular:     Rate and Rhythm: Normal rate and regular rhythm.     Heart sounds: Normal heart sounds. No murmur heard. Pulmonary:     Breath sounds: Normal breath sounds. No  wheezing or rales.  Abdominal:     Palpations: Abdomen is soft.     Tenderness: There is no abdominal tenderness.  Musculoskeletal:     Cervical back: Neck supple. No tenderness.     Right lower leg: No edema.     Left lower leg: No edema.  Skin:    General: Skin is warm.     Findings: No rash.  Neurological:     General: No focal deficit present.     Mental Status: She is alert and oriented to person, place, and time.     Cranial Nerves: No cranial nerve deficit.     Sensory: Sensory deficit (Intermittent - b/l hands and feet) present.     Motor: No weakness.  Psychiatric:        Mood and Affect: Mood normal.        Behavior: Behavior normal.     BP 122/62 (BP Location: Left Arm)   Pulse 86   Ht 5\' 1"  (1.549 m)   Wt 229 lb (103.9 kg)   SpO2 96%   BMI 43.27 kg/m  Wt Readings from Last 3 Encounters:  07/02/23 229 lb (103.9 kg)  06/11/23 228 lb 9.6 oz (103.7 kg)  02/26/23 224 lb 3.2 oz (101.7 kg)    Lab Results  Component Value Date   TSH 1.590 06/11/2023   Lab Results  Component Value Date   WBC 7.7 06/11/2023   HGB 13.4 06/11/2023   HCT 42.7 06/11/2023   MCV 77 (L) 06/11/2023   PLT 383 06/11/2023   Lab Results  Component Value Date   NA 140 06/11/2023   K 4.2 06/11/2023   CO2 24 06/11/2023   GLUCOSE 70 06/11/2023   BUN 10 06/11/2023   CREATININE 0.74 06/11/2023   BILITOT <0.2 06/11/2023   ALKPHOS 105 06/11/2023   AST 19 06/11/2023   ALT 15 06/11/2023   PROT 7.2 06/11/2023   ALBUMIN 4.3 06/11/2023   CALCIUM 9.3 06/11/2023   ANIONGAP 9 07/22/2020   EGFR 107 06/11/2023   Lab Results  Component Value Date   CHOL 139 06/23/2022   Lab Results  Component Value Date   HDL 34 (L) 06/23/2022   Lab Results  Component Value Date   LDLCALC 89 06/23/2022   Lab Results  Component Value Date   TRIG 81 06/23/2022   Lab Results  Component Value Date   CHOLHDL 4.1 06/23/2022   Lab Results  Component Value Date   HGBA1C 5.7 (H) 06/11/2023       Assessment & Plan:   Problem List Items Addressed This Visit       Cardiovascular and Mediastinum   Migraine   Well controlled currently Migraine can also cause intermittent numbness and tingling, but considering persistent symptoms with new onset dizziness, will get MRI of brain Has Maxalt and Nurtec as needed for breakthrough headache Elavil has likely improved her migraine frequency Followed by neurology      Relevant Medications   traMADol (ULTRAM) 50 MG tablet   Other Relevant Orders   MR Brain Wo Contrast   Essential hypertension   BP Readings from Last 1 Encounters:  07/02/23  122/62   Well-controlled with amlodipine 5 mg daily Counseled for compliance with the medications Advised to follow DASH diet and perform moderate exercise/walking at least 150 minutes/week        Other   Fibromyalgia   Had added Elavil, has improved sleep and myalgias- later increased dose to 25 mg at bedtime to improve sleep quality On Cymbalta 60 mg QD and tramadol 50 mg PRN, will continue for now      Relevant Medications   traMADol (ULTRAM) 50 MG tablet   Encounter for general adult medical examination with abnormal findings - Primary   Physical exam as documented. Fasting labs reviewed. Advised to follow low-carb diet and focus on weight loss. PAP smear with OB/GYN.      B12 deficiency   B12 deficiency can give neuropathic symptoms B12 injection today Advised to take oral vitamin B12 500 mcg QD      Numbness and tingling   Intermittent numbness and tingling - but without headache lately Unclear etiology She already takes amitriptyline and Cymbalta, which can be helpful for neuropathy Migraine can also cause intermittent numbness and tingling, but considering persistent symptoms with new onset dizziness, will get MRI of brain Advised to follow up with neurology as well      Relevant Orders   MR Brain Wo Contrast      Meds ordered this encounter  Medications    traMADol (ULTRAM) 50 MG tablet    Sig: Take 1 tablet (50 mg total) by mouth every 8 (eight) hours as needed.    Dispense:  30 tablet    Refill:  0   cyanocobalamin (VITAMIN B12) injection 1,000 mcg    Follow-up: Return in about 4 months (around 11/01/2023) for DM and fibromyalgia.    [name], MD

## 2023-07-02 NOTE — Assessment & Plan Note (Signed)
 Physical exam as documented. Fasting labs reviewed. Advised to follow low-carb diet and focus on weight loss. PAP smear with OB/GYN.

## 2023-07-02 NOTE — Patient Instructions (Addendum)
 Please start taking Vitamin B12 500 mcg once daily.  Please take Tramadol as needed for severe pain.  Please continue to take medications as prescribed.  Please continue to follow low carb diet and perform moderate exercise/walking at least 150 mins/week.

## 2023-07-02 NOTE — Addendum Note (Signed)
 Addended byTrena Platt on: 07/02/2023 05:35 PM   Modules accepted: Level of Service

## 2023-07-02 NOTE — Assessment & Plan Note (Signed)
 B12 deficiency can give neuropathic symptoms B12 injection today Advised to take oral vitamin B12 500 mcg QD

## 2023-07-02 NOTE — Assessment & Plan Note (Signed)
 BP Readings from Last 1 Encounters:  07/02/23 122/62   Well-controlled with amlodipine 5 mg daily Counseled for compliance with the medications Advised to follow DASH diet and perform moderate exercise/walking at least 150 minutes/week

## 2023-07-02 NOTE — Assessment & Plan Note (Addendum)
 Well controlled currently Migraine can also cause intermittent numbness and tingling, but considering persistent symptoms with new onset dizziness, will get MRI of brain Has Maxalt and Nurtec as needed for breakthrough headache Elavil has likely improved her migraine frequency Followed by neurology

## 2023-07-02 NOTE — Assessment & Plan Note (Signed)
 Had added Elavil, has improved sleep and myalgias- later increased dose to 25 mg at bedtime to improve sleep quality On Cymbalta 60 mg QD and tramadol 50 mg PRN, will continue for now

## 2023-07-02 NOTE — Assessment & Plan Note (Addendum)
 Intermittent numbness and tingling - but without headache lately Unclear etiology She already takes amitriptyline and Cymbalta, which can be helpful for neuropathy Migraine can also cause intermittent numbness and tingling, but considering persistent symptoms with new onset dizziness, will get MRI of brain Advised to follow up with neurology as well

## 2023-07-08 ENCOUNTER — Other Ambulatory Visit: Payer: Self-pay | Admitting: Internal Medicine

## 2023-07-08 DIAGNOSIS — E1169 Type 2 diabetes mellitus with other specified complication: Secondary | ICD-10-CM

## 2023-07-09 ENCOUNTER — Other Ambulatory Visit: Payer: Self-pay | Admitting: Internal Medicine

## 2023-07-09 DIAGNOSIS — L249 Irritant contact dermatitis, unspecified cause: Secondary | ICD-10-CM

## 2023-07-12 ENCOUNTER — Other Ambulatory Visit: Payer: Self-pay | Admitting: Internal Medicine

## 2023-07-12 DIAGNOSIS — E1169 Type 2 diabetes mellitus with other specified complication: Secondary | ICD-10-CM

## 2023-07-14 ENCOUNTER — Encounter: Payer: Self-pay | Admitting: Internal Medicine

## 2023-07-15 ENCOUNTER — Other Ambulatory Visit: Payer: Self-pay | Admitting: Internal Medicine

## 2023-07-15 DIAGNOSIS — R202 Paresthesia of skin: Secondary | ICD-10-CM

## 2023-07-21 ENCOUNTER — Encounter: Payer: Self-pay | Admitting: Internal Medicine

## 2023-07-21 ENCOUNTER — Other Ambulatory Visit: Payer: Self-pay | Admitting: Internal Medicine

## 2023-07-21 DIAGNOSIS — F40240 Claustrophobia: Secondary | ICD-10-CM

## 2023-07-21 MED ORDER — LORAZEPAM 0.5 MG PO TABS: ORAL_TABLET | ORAL | 0 refills | Status: DC

## 2023-07-24 ENCOUNTER — Ambulatory Visit (HOSPITAL_COMMUNITY)
Admission: RE | Admit: 2023-07-24 | Discharge: 2023-07-24 | Disposition: A | Discharge status: Home / Self Care | Source: Ambulatory Visit | Attending: Internal Medicine | Admitting: Internal Medicine

## 2023-07-24 DIAGNOSIS — J320 Chronic maxillary sinusitis: Secondary | ICD-10-CM | POA: Diagnosis not present

## 2023-07-24 DIAGNOSIS — R200 Anesthesia of skin: Secondary | ICD-10-CM | POA: Diagnosis not present

## 2023-07-24 DIAGNOSIS — R202 Paresthesia of skin: Secondary | ICD-10-CM | POA: Diagnosis not present

## 2023-07-24 DIAGNOSIS — G43E19 Chronic migraine with aura, intractable, without status migrainosus: Secondary | ICD-10-CM | POA: Diagnosis not present

## 2023-07-24 DIAGNOSIS — R519 Headache, unspecified: Secondary | ICD-10-CM | POA: Diagnosis not present

## 2023-07-27 ENCOUNTER — Other Ambulatory Visit: Payer: Self-pay | Admitting: Internal Medicine

## 2023-07-27 DIAGNOSIS — J320 Chronic maxillary sinusitis: Secondary | ICD-10-CM

## 2023-07-27 DIAGNOSIS — E559 Vitamin D deficiency, unspecified: Secondary | ICD-10-CM

## 2023-07-27 MED ORDER — AZITHROMYCIN 250 MG PO TABS: ORAL_TABLET | ORAL | 0 refills | Status: AC

## 2023-07-30 ENCOUNTER — Encounter (INDEPENDENT_AMBULATORY_CARE_PROVIDER_SITE_OTHER): Payer: Self-pay | Admitting: Otolaryngology

## 2023-07-31 ENCOUNTER — Other Ambulatory Visit: Payer: Self-pay | Admitting: Internal Medicine

## 2023-07-31 ENCOUNTER — Encounter: Payer: Self-pay | Admitting: Internal Medicine

## 2023-07-31 DIAGNOSIS — M4722 Other spondylosis with radiculopathy, cervical region: Secondary | ICD-10-CM | POA: Insufficient documentation

## 2023-07-31 DIAGNOSIS — M542 Cervicalgia: Secondary | ICD-10-CM

## 2023-07-31 NOTE — Telephone Encounter (Signed)
 scheduled

## 2023-08-01 ENCOUNTER — Other Ambulatory Visit: Payer: Self-pay | Admitting: Adult Health

## 2023-08-06 ENCOUNTER — Encounter: Payer: Self-pay | Admitting: Internal Medicine

## 2023-08-06 ENCOUNTER — Ambulatory Visit: Admitting: Internal Medicine

## 2023-08-06 VITALS — BP 94/65 | HR 97 | Ht 61.0 in | Wt 231.6 lb

## 2023-08-06 DIAGNOSIS — M797 Fibromyalgia: Secondary | ICD-10-CM

## 2023-08-06 DIAGNOSIS — M4722 Other spondylosis with radiculopathy, cervical region: Secondary | ICD-10-CM

## 2023-08-06 DIAGNOSIS — G43E19 Chronic migraine with aura, intractable, without status migrainosus: Secondary | ICD-10-CM | POA: Diagnosis not present

## 2023-08-06 DIAGNOSIS — K219 Gastro-esophageal reflux disease without esophagitis: Secondary | ICD-10-CM

## 2023-08-06 MED ORDER — CELECOXIB 200 MG PO CAPS
200.0000 mg | ORAL_CAPSULE | Freq: Two times a day (BID) | ORAL | 1 refills | Status: DC
Start: 2023-08-06 — End: 2023-12-14

## 2023-08-06 MED ORDER — PANTOPRAZOLE SODIUM 40 MG PO TBEC: 40.0000 mg | DELAYED_RELEASE_TABLET | Freq: Every day | ORAL | 1 refills | Status: AC

## 2023-08-06 MED ORDER — TRAMADOL HCL 50 MG PO TABS: 50.0000 mg | ORAL_TABLET | Freq: Two times a day (BID) | ORAL | 0 refills | Status: AC | PRN

## 2023-08-06 MED ORDER — PREDNISONE 20 MG PO TABS
40.0000 mg | ORAL_TABLET | Freq: Every day | ORAL | 0 refills | Status: DC
Start: 2023-08-06 — End: 2023-10-15

## 2023-08-06 NOTE — Assessment & Plan Note (Signed)
 Has acute on chronic neck pain, radiating to bilateral UE Has numbness and tingling of bilateral UE and LE Recent MRI of brain reviewed, concern for cervical spinal stenosis Check MRI of cervical spine Prednisone  40 mg QD x 5 days Celebrex  200 mg as needed after completing oral prednisone  Tramadol  as needed for severe pain Tizanidine  as needed for muscle spasms May need spine surgery evaluation based on MRI of cervical spine

## 2023-08-06 NOTE — Assessment & Plan Note (Signed)
 Takes pantoprazole  as needed, advised to take it once daily while taking oral prednisone 

## 2023-08-06 NOTE — Assessment & Plan Note (Signed)
 Had added Elavil, has improved sleep and myalgias- later increased dose to 25 mg at bedtime to improve sleep quality On Cymbalta 60 mg QD and tramadol 50 mg PRN, will continue for now

## 2023-08-06 NOTE — Patient Instructions (Addendum)
 Please start taking Prednisone  as prescribed.  Please take Celecoxib  as needed for pain after completing Prednisone .  Okay to apply heating pad as needed for neck pain.  Please get MRI of cervical spine as scheduled.

## 2023-08-06 NOTE — Assessment & Plan Note (Signed)
 Well controlled currently Has Maxalt and Nurtec as needed for breakthrough headache Elavil has likely improved her migraine frequency Followed by neurology

## 2023-08-06 NOTE — Progress Notes (Signed)
 Acute Office Visit  Subjective:    Patient ID: Theresa Rosario, female    DOB: Aug 10, 1985, 38 y.o.   MRN: [id number]  Chief Complaint  Patient presents with   Neck Pain    Pt reports ongoing neck pain.    HPI Patient is in today for complaint of persistent neck pain, radiating to occipital area.  Neck pain is constant, dull, radiating to bilateral shoulder area as well and is associated with bilateral hand numbness and tingling.  She has tried taking Tylenol , Celebrex  and ibuprofen  without much relief.  She also has Elavil  and tizanidine  for fibromyalgia.  She was initially having occipital headache, for which she had MRI of brain, which was negative for any acute changes, but showed cervical spondylosis.  She has history of migraine, for which she takes Elavil  currently.  She used to get Botox injections by Dr Margie Sheller in the past.  Past Medical History:  Diagnosis Date   Abnormal chromosomal and genetic finding on antenatal screening mother 02/06/2020   On Horizon Nov 2021     Silent carrier for alpha-thalassemia (FOB____)  and increased carrier risk for SMA  (FOB____)   Anemia 12/30/2018   Blood in urine 10/18/2015   Condyloma of female genitalia 10/21/2012   Subclitoral, TCA 7/31    Depression    Diabetes mellitus without complication (HCC)    Fibromyalgia    Hx of hematuria    Hx of ovarian cyst    Hypertension    Irregular bleeding 12/02/2017   Kidney stone    History of kidney stones   Large breasts    Lumbago 11/11/2011   Migraine     Past Surgical History:  Procedure Laterality Date   DENTAL SURGERY     WISDOM TOOTH EXTRACTION Bilateral     Family History  Problem Relation Age of Onset   Diabetes Paternal Grandmother    Hypertension Paternal Grandmother    Diabetes Maternal Grandmother    Heart disease Maternal Grandfather    Hypertension Father    Diabetes Mother    Hypertension Mother    Parkinson's disease Mother    Colon cancer Mother 21   Coronary  artery disease Other    Diabetes Other     Social History   Socioeconomic History   Marital status: Significant Other    Spouse name: Not on file   Number of children: 2   Years of education: college   Highest education level: Master's degree (e.g., MA, MS, MEng, MEd, MSW, MBA)  Occupational History   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 Right-handed. 2-3 cups caffeine per day.   Social Drivers of Health   Financial Resource Strain: Low Risk  (08/29/2020)   Overall Financial Resource Strain (CARDIA)    Difficulty of Paying Living Expenses: Not  very hard  Food Insecurity: No Food Insecurity (08/29/2020)   Hunger Vital Sign    Worried About Running Out of Food in the Last Year: Never true    Ran Out of Food in the Last Year: Never true  Transportation Needs: No Transportation Needs (08/29/2020)   PRAPARE - Administrator, Civil Service (Medical): No    Lack of Transportation (Non-Medical): No  Physical Activity: Inactive (08/29/2020)   Exercise Vital Sign    Days of Exercise per Week: 0 days    Minutes of Exercise per Session: 0 min  Stress: No Stress Concern Present (08/29/2020)   [employer] of Occupational Health - Occupational Stress Questionnaire    Feeling of Stress : Only a little  Social Connections: Moderately Isolated (08/29/2020)   Social Connection and Isolation Panel [NHANES]    Frequency of  Communication with Friends and Family: Once a week    Frequency of Social Gatherings with Friends and Family: Never    Attends Religious Services: 1 to 4 times per year    Active Member of [employer] or Organizations: No    Attends Banker Meetings: Never    Marital Status: Living with partner  Intimate Partner Violence: Not At Risk (08/29/2020)   Humiliation, Afraid, Rape, and Kick questionnaire    Fear of Current or Ex-Partner: No    Emotionally Abused: No    Physically Abused: No    Sexually Abused: No    Outpatient Medications Prior to Visit  Medication Sig Dispense Refill   ACCU-CHEK GUIDE TEST test strip USE TO TEST BLOOD GLUCOSE EVERY MORNING, AT NOON AND EVERY NIGHT AT BEDTIME 100 strip 0   Accu-Chek Softclix Lancets lancets USE TO TEST BLOOD GLUCOSE EVERY MORNING, AT NOON AND EVERY NIGHT AT BEDTIME 100 each 0   acetaminophen  (TYLENOL ) 500 MG tablet Take 2 tablets (1,000 mg total) by mouth every 6 (six) hours. 30 tablet 0   amitriptyline  (ELAVIL ) 25 MG tablet TAKE 1 TABLET(25 MG) BY MOUTH AT BEDTIME 30 tablet 3   amLODipine  (NORVASC ) 5 MG tablet TAKE 1 TABLET(5 MG) BY MOUTH DAILY 30 tablet 6   Blood Glucose Monitoring Suppl DEVI 1 each by Does not apply route in the morning, at noon, and at bedtime. May substitute to any manufacturer covered by patient's insurance. 1 each 0   cetirizine  (ZYRTEC ) 10 MG tablet TAKE 1 TABLET(10 MG) BY MOUTH DAILY 30 tablet 5   clonazePAM (KLONOPIN) 0.5 MG tablet Take 0.5 mg by mouth 2 (two) times daily as needed.     DULoxetine  (CYMBALTA ) 60 MG capsule Take 1 capsule (60 mg total) by mouth daily. 90 capsule 0   LORazepam  (ATIVAN ) 0.5 MG tablet Take 1 tablet 45 minutes prior to procedure. If persistent anxiety, take second tablet after waiting for 30 minutes. 2 tablet 0   naratriptan  (AMERGE) 2.5 MG tablet Take 1 tablet (2.5 mg total) by mouth as needed for migraine. Take one (1) tablet at onset of headache; if returns or does not resolve, may  repeat after 4 hours; do not exceed five (5) mg in 24 hours. 10 tablet 11   norethindrone  (MICRONOR ) 0.35 MG tablet Take 1 tablet (0.35 mg total) by mouth daily. 84 tablet 1   OZEMPIC , 2 MG/DOSE, 8 MG/3ML SOPN INJECT 2 MG INTO THE SKIN EVERY 7 DAYS 3 mL 3   Rimegepant Sulfate (NURTEC) 75 MG TBDP Take 1 tablet (75 mg total) by mouth daily as needed (for migraine). 16 tablet 1  rizatriptan  (MAXALT -MLT) 10 MG disintegrating tablet Take 1 tablet (10 mg total) by mouth as needed. May repeat in 2 hours if needed 12 tablet 6   tiZANidine  (ZANAFLEX ) 4 MG tablet TAKE 1 TABLET(4 MG) BY MOUTH EVERY 6 HOURS AS NEEDED 30 tablet 9   vitamin B-12 (CYANOCOBALAMIN ) 500 MCG tablet Take 1 tablet (500 mcg total) by mouth daily. 100 tablet 1   Vitamin D , Ergocalciferol , (DRISDOL ) 1.25 MG (50000 UNIT) CAPS capsule TAKE 1 CAPSULE BY MOUTH EVERY 7 DAYS 12 capsule 0   celecoxib  (CELEBREX ) 200 MG capsule Take 1 capsule (200 mg total) by mouth 2 (two) times daily. 30 capsule 0   pantoprazole  (PROTONIX ) 40 MG tablet TAKE 1 TABLET(40 MG) BY MOUTH DAILY 90 tablet 1   traMADol  (ULTRAM ) 50 MG tablet Take 1 tablet (50 mg total) by mouth every 8 (eight) hours as needed. 30 tablet 0   No facility-administered medications prior to visit.    Allergies  Allergen Reactions   Amoxicillin Hives   Banana Itching    Tongue itching   Motrin  [Ibuprofen ] Rash    MOTRIN  (Brand only) causes this allergy (tolerates ADVIL  brand)   Other Itching    Tree nuts cause tongue itching    Pineapple Itching    Tongue itching    Review of Systems  Constitutional:  Negative for chills and fever.  HENT:  Positive for congestion. Negative for sore throat.   Eyes:  Negative for pain and discharge.  Respiratory:  Negative for cough and shortness of breath.   Cardiovascular:  Negative for chest pain and palpitations.  Gastrointestinal:  Positive for constipation. Negative for diarrhea, nausea and vomiting.  Endocrine: Negative for polydipsia  and polyuria.  Genitourinary:  Negative for dysuria and hematuria.  Musculoskeletal:  Positive for arthralgias, back pain, myalgias and neck pain. Negative for neck stiffness.  Skin:  Negative for rash.  Neurological:  Positive for dizziness, numbness (B/l hands and feet) and headaches. Negative for syncope and weakness.  Psychiatric/Behavioral:  Positive for sleep disturbance. Negative for agitation and behavioral problems.        Objective:     Physical Exam Vitals reviewed.  Constitutional:      General: She is not in acute distress.    Appearance: She is obese. She is not diaphoretic.  HENT:     Head: Normocephalic and atraumatic.     Nose: Nose normal.     Mouth/Throat:     Mouth: Mucous membranes are moist.  Eyes:     General: No scleral icterus.    Extraocular Movements: Extraocular movements intact.  Cardiovascular:     Rate and Rhythm: Normal rate and regular rhythm.     Heart sounds: Normal heart sounds. No murmur heard. Pulmonary:     Breath sounds: Normal breath sounds. No wheezing or rales.  Musculoskeletal:     Cervical back: Neck supple. Tenderness present. Pain with movement present.     Right lower leg: No edema.     Left lower leg: No edema.  Skin:    General: Skin is warm.     Findings: No rash.  Neurological:     General: No focal deficit present.     Mental Status: She is alert and oriented to person, place, and time.     Sensory: Sensory deficit (Intermittent - b/l hands and feet) present.     Motor: No weakness.  Psychiatric:        Mood and Affect: Mood normal.  Behavior: Behavior normal.     BP 94/65   Pulse 97   Ht 5\' 1"  (1.549 m)   Wt 231 lb 9.6 oz (105.1 kg)   SpO2 96%   BMI 43.76 kg/m  Wt Readings from Last 3 Encounters:  08/06/23 231 lb 9.6 oz (105.1 kg)  07/02/23 229 lb (103.9 kg)  06/11/23 228 lb 9.6 oz (103.7 kg)        Assessment & Plan:   Problem List Items Addressed This Visit       Cardiovascular and  Mediastinum   Migraine   Well controlled currently Has Maxalt  and Nurtec as needed for breakthrough headache Elavil  has likely improved her migraine frequency Followed by neurology      Relevant Medications   celecoxib  (CELEBREX ) 200 MG capsule   traMADol  (ULTRAM ) 50 MG tablet     Digestive   GERD (gastroesophageal reflux disease)   Takes pantoprazole  as needed, advised to take it once daily while taking oral prednisone       Relevant Medications   pantoprazole  (PROTONIX ) 40 MG tablet     Nervous and Auditory   Cervical spondylosis with radiculopathy - Primary   Has acute on chronic neck pain, radiating to bilateral UE Has numbness and tingling of bilateral UE and LE Recent MRI of brain reviewed, concern for cervical spinal stenosis Check MRI of cervical spine Prednisone  40 mg QD x 5 days Celebrex  200 mg as needed after completing oral prednisone  Tramadol  as needed for severe pain Tizanidine  as needed for muscle spasms May need spine surgery evaluation based on MRI of cervical spine      Relevant Medications   predniSONE  (DELTASONE ) 20 MG tablet     Other   Fibromyalgia   Had added Elavil , has improved sleep and myalgias- later increased dose to 25 mg at bedtime to improve sleep quality On Cymbalta  60 mg QD and tramadol  50 mg PRN, will continue for now      Relevant Medications   predniSONE  (DELTASONE ) 20 MG tablet   celecoxib  (CELEBREX ) 200 MG capsule   traMADol  (ULTRAM ) 50 MG tablet     Meds ordered this encounter  Medications   predniSONE  (DELTASONE ) 20 MG tablet    Sig: Take 2 tablets (40 mg total) by mouth daily with breakfast.    Dispense:  10 tablet    Refill:  0   celecoxib  (CELEBREX ) 200 MG capsule    Sig: Take 1 capsule (200 mg total) by mouth 2 (two) times daily.    Dispense:  30 capsule    Refill:  1   pantoprazole  (PROTONIX ) 40 MG tablet    Sig: Take 1 tablet (40 mg total) by mouth daily.    Dispense:  90 tablet    Refill:  1   traMADol   (ULTRAM ) 50 MG tablet    Sig: Take 1 tablet (50 mg total) by mouth every 12 (twelve) hours as needed.    Dispense:  30 tablet    Refill:  0     Zidane Renner Alyssa Backbone, MD

## 2023-09-07 ENCOUNTER — Other Ambulatory Visit: Payer: Self-pay | Admitting: Internal Medicine

## 2023-09-07 DIAGNOSIS — F40240 Claustrophobia: Secondary | ICD-10-CM

## 2023-09-07 MED ORDER — LORAZEPAM 0.5 MG PO TABS: ORAL_TABLET | ORAL | 0 refills | Status: DC

## 2023-09-10 ENCOUNTER — Ambulatory Visit (HOSPITAL_COMMUNITY)
Admission: RE | Admit: 2023-09-10 | Discharge: 2023-09-10 | Disposition: A | Discharge status: Home / Self Care | Source: Ambulatory Visit | Attending: Internal Medicine | Admitting: Internal Medicine

## 2023-09-10 DIAGNOSIS — M4802 Spinal stenosis, cervical region: Secondary | ICD-10-CM | POA: Diagnosis not present

## 2023-09-10 DIAGNOSIS — M4722 Other spondylosis with radiculopathy, cervical region: Secondary | ICD-10-CM | POA: Diagnosis not present

## 2023-09-10 DIAGNOSIS — M50222 Other cervical disc displacement at C5-C6 level: Secondary | ICD-10-CM | POA: Diagnosis not present

## 2023-09-10 DIAGNOSIS — M50221 Other cervical disc displacement at C4-C5 level: Secondary | ICD-10-CM | POA: Diagnosis not present

## 2023-09-10 DIAGNOSIS — M47812 Spondylosis without myelopathy or radiculopathy, cervical region: Secondary | ICD-10-CM | POA: Diagnosis not present

## 2023-09-17 ENCOUNTER — Other Ambulatory Visit: Payer: Self-pay | Admitting: Internal Medicine

## 2023-09-17 ENCOUNTER — Ambulatory Visit: Payer: Self-pay | Admitting: Internal Medicine

## 2023-09-17 DIAGNOSIS — M4722 Other spondylosis with radiculopathy, cervical region: Secondary | ICD-10-CM

## 2023-10-02 ENCOUNTER — Other Ambulatory Visit: Payer: Self-pay | Admitting: Internal Medicine

## 2023-10-02 DIAGNOSIS — M797 Fibromyalgia: Secondary | ICD-10-CM

## 2023-10-06 ENCOUNTER — Ambulatory Visit: Admitting: Neurology

## 2023-10-15 ENCOUNTER — Ambulatory Visit (INDEPENDENT_AMBULATORY_CARE_PROVIDER_SITE_OTHER): Admitting: Otolaryngology

## 2023-10-15 ENCOUNTER — Encounter (INDEPENDENT_AMBULATORY_CARE_PROVIDER_SITE_OTHER): Payer: Self-pay | Admitting: Otolaryngology

## 2023-10-15 VITALS — HR 84 | Ht 62.0 in | Wt 242.0 lb

## 2023-10-15 DIAGNOSIS — R0981 Nasal congestion: Secondary | ICD-10-CM | POA: Diagnosis not present

## 2023-10-15 DIAGNOSIS — J328 Other chronic sinusitis: Secondary | ICD-10-CM | POA: Diagnosis not present

## 2023-10-15 DIAGNOSIS — J3089 Other allergic rhinitis: Secondary | ICD-10-CM

## 2023-10-15 DIAGNOSIS — J343 Hypertrophy of nasal turbinates: Secondary | ICD-10-CM

## 2023-10-15 DIAGNOSIS — R0982 Postnasal drip: Secondary | ICD-10-CM

## 2023-10-15 MED ORDER — DOXYCYCLINE HYCLATE 100 MG PO TABS: 100.0000 mg | ORAL_TABLET | Freq: Two times a day (BID) | ORAL | 0 refills | Status: AC

## 2023-10-15 MED ORDER — FLUTICASONE PROPIONATE 50 MCG/ACT NA SUSP: 2.0000 | Freq: Every day | NASAL | 6 refills | Status: AC

## 2023-10-15 MED ORDER — PREDNISONE 10 MG PO TABS: 10.0000 mg | ORAL_TABLET | Freq: Every day | ORAL | 0 refills | Status: AC

## 2023-10-15 MED ORDER — AZELASTINE HCL 0.1 % NA SOLN: 2.0000 | Freq: Every day | NASAL | 12 refills | Status: AC

## 2023-10-15 NOTE — Patient Instructions (Signed)
 I have ordered an imaging study for you to complete prior to your next visit. Please call [facility] Scheduling at [phone] to schedule your imaging if you have not received a call within 24 hours. If you are unable to complete your imaging study prior to your next scheduled visit please call our office to let us  know.   Take doxycycline  100mg  tablet twice daily for 10 days; use prednisone  10mg  tablet once daily for 5 days in morning with breakfast  Use two sprays of flonase  in each nostril daily; right after, use astelin  spray two sprays each nostril twice per day   [facility] Med Nasal Saline Rinse - use daily - start nasal saline rinses with [facility] Bottle available over the counter    Nasal Saline Irrigation instructions: If you choose to make your own salt water solution, You will need: Salt (kosher, canning, or pickling salt) Baking soda Nasal irrigation bottle (i.e. Aurel[facility] Med Sinus Rinse) Measuring spoon ( teaspoon) Distilled / boiled water   Mix solution Mix 1 teaspoon of salt, 1/2 teaspoon of baking soda and 1 cup of water into irrigation bottle ** May use saline packet instead of homemade recipe for this step if you prefer If medicine was prescribed to be mixed with solution, place this into bottle Examples 2 inches of 2% mupirocin  ointment Budesonide solution Position your head: Lean over sink (about 45 degrees) Rotate head (about 45 degrees) so that one nostril is above the other Irrigate Insert tip of irrigation bottle into upper nostril so it forms a comfortable seal Irrigate while breathing through your mouth May remove the straw from the bottle in order to irrigate the entire solution (important if medicine was added) Exhale through nose when finished and blow nose as necessary  Repeat on opposite side with other 1/2 of solution (120 mL) or remake solution if all 240 mL was used on first side Wash irrigation bottle regularly, replace every 3  months

## 2023-10-15 NOTE — Progress Notes (Signed)
 Dear Dr. Tobie, Here is my assessment for our mutual patient, Theresa Rosario. Thank you for allowing me the opportunity to care for your patient. Please do not hesitate to contact me should you have any other questions. Sincerely, Dr. Eldora Tobie  Otolaryngology Clinic Note  HISTORY: Theresa Rosario is a 38 y.o. female with history of Migraines kindly referred by Dr. Tobie for evaluation of chronic sinusitis  Initial visit (09/2023): She reports that she has she chronic sinus symptoms as well as allergies. She reports that she has intermittent history of facial pressure/pain - right side is worse. Mostly does not have discolored drainage. Some PND and anterior rhinorrhea. All this is intermittent. She reports that she has a sinus infection every other month - symptoms include congestion, eye puffiness (mostly AR symptoms), sneezing. She does not typically get abx/steroids, with last round of abx after her MRI.  She has tried PO benadryl  or OTC cold medications. No prior nasal spray use.   Her migraine symptoms include significant headaches, can't see well, whole face hurts and lasts ~2-3 days, burning in face and then goes away. Tried migraine medications which do help but do not completely resolve her pain. Migraines seem seasonal - I.e. occurs in clusters  Allergy testing has been done. No previous sinonasal surgery.  GLP-1: ozempic  AP/AC: no  Tobacco: no  PMHx: T2DM, HTN, Insomnia, OSA, Fibromyalgia, Migraines  RADIOGRAPHIC EVALUATION AND INDEPENDENT REVIEW OF OTHER RECORDS:: Dr. JONELLE Tobie (2025) referral notes: noted MRI brian for facial numnbess and headaches; Dx: Chronic sinusitis; Rx: ref to ENT -- did get doxy, steroids Dr. Tobie 07/02/2023: noted b/l burning pain in hands/feet, noted h/o Migraines with intermittent tingling and numbness; Dx: Migraines; Rx: MRI Harlene Bogaert (12/03/2022): chronic headaches, 1 migraine per month; left retro-orbital, light and noise  sensitivity Dr. Iva (08/14/2021): noted chronic rhinitis, start zyrtec , flonase . Recurrent sinus infections, rec labs Prior Skin testing RAST 05/17/2021: + to alternaria CBC and CMP 02/26/2023): WBC 7.7, Eos 100; BUN/Cr wnl Immune labs 10/22/2021: Complement wnl, Ig's wnl, Strep titers low MRI Brain wo 07/24/2023 independently interpreted with respsect to sinuses and ears: right mastoid effusion, b/l max sinusitis L > R with left > right ethmoid sinusitis as well Past Medical History:  Diagnosis Date   Abnormal chromosomal and genetic finding on antenatal screening mother 02/06/2020   On Horizon Nov 2021     Silent carrier for alpha-thalassemia (FOB____)  and increased carrier risk for SMA  (FOB____)   Anemia 12/30/2018   Blood in urine 10/18/2015   Condyloma of female genitalia 10/21/2012   Subclitoral, TCA 7/31    Depression    Diabetes mellitus without complication (HCC)    Fibromyalgia    Hx of hematuria    Hx of ovarian cyst    Hypertension    Irregular bleeding 12/02/2017   Kidney stone    History of kidney stones   Large breasts    Lumbago 11/11/2011   Migraine    Past Surgical History:  Procedure Laterality Date   DENTAL SURGERY     WISDOM TOOTH EXTRACTION Bilateral    Family History  Problem Relation Age of Onset   Diabetes Paternal Grandmother    Hypertension Paternal Grandmother    Diabetes Maternal Grandmother    Heart disease Maternal Grandfather    Hypertension Father    Diabetes Mother    Hypertension Mother    Parkinson's disease Mother    Colon cancer Mother 26   Coronary artery disease Other  Diabetes Other    Social History   Tobacco Use   Smoking status: Former    Current packs/day: 0.00    Types: Cigarettes    Quit date: 09/22/2019    Years since quitting: 4.0   Smokeless tobacco: Never   Tobacco comments:    occasional vape  Substance Use Topics   Alcohol use: Not Currently    Comment: socially   Allergies  Allergen Reactions    Amoxicillin Hives   Banana Itching    Tongue itching   Motrin  [Ibuprofen ] Rash    MOTRIN  (Brand only) causes this allergy (tolerates ADVIL  brand)   Other Itching    Tree nuts cause tongue itching    Pineapple Itching    Tongue itching   Current Outpatient Medications  Medication Sig Dispense Refill   ACCU-CHEK GUIDE TEST test strip USE TO TEST BLOOD GLUCOSE EVERY MORNING, AT NOON AND EVERY NIGHT AT BEDTIME 100 strip 0   Accu-Chek Softclix Lancets lancets USE TO TEST BLOOD GLUCOSE EVERY MORNING, AT NOON AND EVERY NIGHT AT BEDTIME 100 each 0   acetaminophen  (TYLENOL ) 500 MG tablet Take 2 tablets (1,000 mg total) by mouth every 6 (six) hours. 30 tablet 0   amitriptyline  (ELAVIL ) 25 MG tablet TAKE 1 TABLET(25 MG) BY MOUTH AT BEDTIME 30 tablet 3   amLODipine  (NORVASC ) 5 MG tablet TAKE 1 TABLET(5 MG) BY MOUTH DAILY 30 tablet 6   azelastine  (ASTELIN ) 0.1 % nasal spray Place 2 sprays into both nostrils daily. Use in each nostril as directed 30 mL 12   Blood Glucose Monitoring Suppl DEVI 1 each by Does not apply route in the morning, at noon, and at bedtime. May substitute to any manufacturer covered by patient's insurance. 1 each 0   celecoxib  (CELEBREX ) 200 MG capsule Take 1 capsule (200 mg total) by mouth 2 (two) times daily. 30 capsule 1   cetirizine  (ZYRTEC ) 10 MG tablet TAKE 1 TABLET(10 MG) BY MOUTH DAILY 30 tablet 5   clonazePAM (KLONOPIN) 0.5 MG tablet Take 0.5 mg by mouth 2 (two) times daily as needed.     doxycycline  (VIBRA -TABS) 100 MG tablet Take 1 tablet (100 mg total) by mouth 2 (two) times daily for 10 days. 20 tablet 0   DULoxetine  (CYMBALTA ) 60 MG capsule TAKE 1 CAPSULE(60 MG) BY MOUTH DAILY 90 capsule 0   fluticasone  (FLONASE ) 50 MCG/ACT nasal spray Place 2 sprays into both nostrils daily. 16 g 6   LORazepam  (ATIVAN ) 0.5 MG tablet Take 1 tablet 45 minutes prior to procedure. If persistent anxiety, take second tablet after waiting for 30 minutes. 2 tablet 0   naratriptan  (AMERGE)  2.5 MG tablet Take 1 tablet (2.5 mg total) by mouth as needed for migraine. Take one (1) tablet at onset of headache; if returns or does not resolve, may repeat after 4 hours; do not exceed five (5) mg in 24 hours. 10 tablet 11   norethindrone  (MICRONOR ) 0.35 MG tablet Take 1 tablet (0.35 mg total) by mouth daily. 84 tablet 1   OZEMPIC , 2 MG/DOSE, 8 MG/3ML SOPN INJECT 2 MG INTO THE SKIN EVERY 7 DAYS 3 mL 3   pantoprazole  (PROTONIX ) 40 MG tablet Take 1 tablet (40 mg total) by mouth daily. 90 tablet 1   predniSONE  (DELTASONE ) 10 MG tablet Take 1 tablet (10 mg total) by mouth daily with breakfast for 5 days. 5 tablet 0   Rimegepant Sulfate (NURTEC) 75 MG TBDP Take 1 tablet (75 mg total) by mouth daily as needed (  for migraine). 16 tablet 1   rizatriptan  (MAXALT -MLT) 10 MG disintegrating tablet Take 1 tablet (10 mg total) by mouth as needed. May repeat in 2 hours if needed 12 tablet 6   tiZANidine  (ZANAFLEX ) 4 MG tablet TAKE 1 TABLET(4 MG) BY MOUTH EVERY 6 HOURS AS NEEDED 30 tablet 9   traMADol  (ULTRAM ) 50 MG tablet Take 1 tablet (50 mg total) by mouth every 12 (twelve) hours as needed. 30 tablet 0   vitamin B-12 (CYANOCOBALAMIN ) 500 MCG tablet Take 1 tablet (500 mcg total) by mouth daily. 100 tablet 1   Vitamin D , Ergocalciferol , (DRISDOL ) 1.25 MG (50000 UNIT) CAPS capsule TAKE 1 CAPSULE BY MOUTH EVERY 7 DAYS (Patient not taking: Reported on 10/15/2023) 12 capsule 0   No current facility-administered medications for this visit.   Pulse 84   Ht 5' 2 (1.575 m)   Wt 242 lb (109.8 kg)   SpO2 97%   BMI 44.26 kg/m   PHYSICAL EXAM:  Pulse 84   Ht 5' 2 (1.575 m)   Wt 242 lb (109.8 kg)   SpO2 97%   BMI 44.26 kg/m    Salient findings:  CN II-XII intact Bilateral EAC clear and TM intact with well pneumatized middle ear spaces Nose: Anterior rhinoscopy reveals septal deviation left, bilateral modest inferior turbinate hypertrophy.  Nasal endoscopy was indicated to better evaluate the nose and  paranasal sinuses, given the patient's history and exam findings, and is detailed below. No lesions of oral cavity/oropharynx; dentition poor - partial on top with poor max dentition No obviously palpable neck masses/lymphadenopathy/thyromegaly No respiratory distress or stridor   PROCEDURE:  Prior to initiating any procedures, risks/benefits/alternatives were explained to the patient and verbal consent obtained. Diagnostic Nasal Endoscopy Pre-procedure diagnosis: Concern for chronic sinusitis Post-procedure diagnosis: same Indication: See pre-procedure diagnosis and physical exam above Complications: None apparent EBL: 0 mL Anesthesia: Lidocaine  4% and topical decongestant was topically sprayed in each nasal cavity  Description of Procedure:  Patient was identified. A rigid 30 degree endoscope was utilized to evaluate the sinonasal cavities, mucosa, sinus ostia and turbinates and septum.  Overall, signs of mucosal inflammation are mild but noted.  No polyps, or masses noted.   Right Middle meatus: clear Right SE Recess: clear Left MM: some mucoid secretions including along nasal floor Left SE Recess: mild mucosal edema   CPT CODE -- 31231 - Mod 25   ASSESSMENT:  38 y.o. w/ h/o migraines with:  1. Other chronic sinusitis   2. Nasal congestion   3. Hypertrophy of both inferior nasal turbinates   4. Post-nasal drip   5. Other allergic rhinitis    PLAN: We've discussed issues and options today.  We reviewed the nasal endoscopy images together.  The risks, benefits and alternatives were discussed and questions answered.  Do not think she is having true headaches from sinus etiology as frontals are clear and etiology/sx not typical. Does have some sinonasal sx which I think are combo of allergy + sinus exacerbations.  Will do post-treatment CT    She has elected to proceed with:  1) Daily Sinus Rinses 2) Flonase  and astelin  daily 3) Pred burst (risks discussed); doxy x10d 4)  Post-treatment CT Agree with neuro referral Follow-up in 3 months -- sooner as necessary.  See below regarding exact medications prescribed this encounter including dosages and route: Meds ordered this encounter  Medications   doxycycline  (VIBRA -TABS) 100 MG tablet    Sig: Take 1 tablet (100 mg total) by mouth  2 (two) times daily for 10 days.    Dispense:  20 tablet    Refill:  0   predniSONE  (DELTASONE ) 10 MG tablet    Sig: Take 1 tablet (10 mg total) by mouth daily with breakfast for 5 days.    Dispense:  5 tablet    Refill:  0   fluticasone  (FLONASE ) 50 MCG/ACT nasal spray    Sig: Place 2 sprays into both nostrils daily.    Dispense:  16 g    Refill:  6   azelastine  (ASTELIN ) 0.1 % nasal spray    Sig: Place 2 sprays into both nostrils daily. Use in each nostril as directed    Dispense:  30 mL    Refill:  12     Thank you for allowing me the opportunity to care for your patient. Please do not hesitate to contact me should you have any other questions.  Sincerely, Eldora Blanch, MD Otolaryngologist (ENT), [facility] Phone: [phone] Fax: [phone]  MDM:  Level 4: [phone] Complexity/Problems addressed: mod  Data complexity: mod - independent interpretation of MRI - Morbidity: mod  - Prescription Drug prescribed or managed: y  10/15/2023, 8:39 AM

## 2023-10-22 ENCOUNTER — Encounter: Payer: Self-pay | Admitting: Neurology

## 2023-10-22 ENCOUNTER — Ambulatory Visit: Admitting: Neurology

## 2023-10-22 VITALS — BP 123/61 | HR 97 | Ht 62.0 in | Wt 242.5 lb

## 2023-10-22 DIAGNOSIS — R7989 Other specified abnormal findings of blood chemistry: Secondary | ICD-10-CM | POA: Diagnosis not present

## 2023-10-22 DIAGNOSIS — R748 Abnormal levels of other serum enzymes: Secondary | ICD-10-CM | POA: Diagnosis not present

## 2023-10-22 DIAGNOSIS — R700 Elevated erythrocyte sedimentation rate: Secondary | ICD-10-CM | POA: Diagnosis not present

## 2023-10-22 DIAGNOSIS — M792 Neuralgia and neuritis, unspecified: Secondary | ICD-10-CM

## 2023-10-22 DIAGNOSIS — Z1329 Encounter for screening for other suspected endocrine disorder: Secondary | ICD-10-CM | POA: Diagnosis not present

## 2023-10-22 DIAGNOSIS — R202 Paresthesia of skin: Secondary | ICD-10-CM

## 2023-10-22 DIAGNOSIS — E079 Disorder of thyroid, unspecified: Secondary | ICD-10-CM | POA: Diagnosis not present

## 2023-10-22 DIAGNOSIS — E538 Deficiency of other specified B group vitamins: Secondary | ICD-10-CM | POA: Diagnosis not present

## 2023-10-22 DIAGNOSIS — M797 Fibromyalgia: Secondary | ICD-10-CM | POA: Diagnosis not present

## 2023-10-22 DIAGNOSIS — R7309 Other abnormal glucose: Secondary | ICD-10-CM | POA: Diagnosis not present

## 2023-10-22 DIAGNOSIS — R7982 Elevated C-reactive protein (CRP): Secondary | ICD-10-CM | POA: Diagnosis not present

## 2023-10-22 MED ORDER — AMITRIPTYLINE HCL 25 MG PO TABS: 50.0000 mg | ORAL_TABLET | Freq: Every day | ORAL | 11 refills | Status: AC

## 2023-10-22 NOTE — Progress Notes (Signed)
 Chief Complaint  Patient presents with   New Patient (Initial Visit)    Rm14, alone, internal referral for numbness and tingling: pt stated that bilateral hands, arms and it radiates to chest, neck and bilateral feet. In essence, its widespread. Ongoing since April but gotten progressively worse       ASSESSMENT AND PLAN  Theresa Rosario is a 38 y.o. female   Subacute onset about bilateral upper and lower extremity paresthesia,  In the setting of long term depression anxiety, chronic pain,  Essentially normal neurological examination, brisk well-preserved deep tendon reflexes, including ankle reflex  MRI of the brain, cervical spine failed to showed etiology to demonstrate above complaints,  Laboratory evaluation to rule out treatable etiology including inflammatory markers  Keep Cymbalta  60 mg daily, higher dose of Elavil  50 mg every night   DIAGNOSTIC DATA (LABS, IMAGING, TESTING) - I reviewed patient records, labs, notes, testing and imaging myself where available.   MEDICAL HISTORY:  Theresa Rosario is a 38 year old female, seen in request by her primary care at [facility] Dr. Tobie, [place] for evaluation of hands and feet paresthesia, initial evaluation October 22, 2023  History is obtained from the patient and review of electronic medical records. I personally reviewed pertinent available imaging films in PACS.   PMHx of  HTN Chronic migraine, DM Depression, anxiety  Since April 2025, without clear triggers, she began to notice fairly acute onset numbness tingling involving her fingers and toes, described numb tingling burning sensation extending from her hands to her upper extremity and chest, foot numbness, painful to bear weight,  She had long history of depression anxiety, obesity, on polypharmacy treatment, including Cymbalta  60 mg, Elavil  25 mg every night, which seems to help her symptoms some  Also tried gabapentin in the past, unknown dosage, did not  help her at all, on Celebrex  200 mg twice a day, chronic migraine, Maxalt  as needed  Already had MRIs, personally reviewed film  MRI cervical June 2025, multilevel mild degenerative changes, straightening of normal lordosis, no significant canal stenosis, variable degree of foraminal narrowing  MRI of the brain, paranasal sinus disease, mostly less severe left maxillary sinusitis   PHYSICAL EXAM:   Vitals:   10/22/23 1257  BP: 123/61  Pulse: 97  Weight: 242 lb 8 oz (110 kg)  Height: 5' 2 (1.575 m)   Body mass index is 44.35 kg/m.  PHYSICAL EXAMNIATION:  Gen: NAD, conversant, well nourised, well groomed                     Cardiovascular: Regular rate rhythm, no peripheral edema, warm, nontender. Eyes: Conjunctivae clear without exudates or hemorrhage Neck: Supple, no carotid bruits. Pulmonary: Clear to auscultation bilaterally   NEUROLOGICAL EXAM:  MENTAL STATUS: Speech/cognition: Awake, alert, oriented to history taking and casual conversation CRANIAL NERVES: CN II: Visual fields are full to confrontation. Pupils are round equal and briskly reactive to light. CN III, IV, VI: extraocular movement are normal. No ptosis. CN V: Facial sensation is intact to light touch CN VII: Face is symmetric with normal eye closure  CN VIII: Hearing is normal to causal conversation. CN IX, X: Phonation is normal. CN XI: Head turning and shoulder shrug are intact  MOTOR: There is no pronator drift of out-stretched arms. Muscle bulk and tone are normal. Muscle strength is normal.  REFLEXES: Reflexes are 2+ and symmetric at the biceps, triceps, knees, and ankles. Plantar responses are flexor.  SENSORY: Intact to light  touch, pinprick and vibratory sensation are intact in fingers and toes.  COORDINATION: There is no trunk or limb dysmetria noted.  GAIT/STANCE: Posture is normal. Gait is mildly antalgic, steady  REVIEW OF SYSTEMS:  Full 14 system review of systems performed and  notable only for as above All other review of systems were negative.   ALLERGIES: Allergies  Allergen Reactions   Amoxicillin Hives   Banana Itching    Tongue itching   Motrin  [Ibuprofen ] Rash    MOTRIN  (Brand only) causes this allergy (tolerates ADVIL  brand)   Other Itching    Tree nuts cause tongue itching    Pineapple Itching    Tongue itching    HOME MEDICATIONS: Current Outpatient Medications  Medication Sig Dispense Refill   ACCU-CHEK GUIDE TEST test strip USE TO TEST BLOOD GLUCOSE EVERY MORNING, AT NOON AND EVERY NIGHT AT BEDTIME 100 strip 0   Accu-Chek Softclix Lancets lancets USE TO TEST BLOOD GLUCOSE EVERY MORNING, AT NOON AND EVERY NIGHT AT BEDTIME 100 each 0   acetaminophen  (TYLENOL ) 500 MG tablet Take 2 tablets (1,000 mg total) by mouth every 6 (six) hours. 30 tablet 0   amitriptyline  (ELAVIL ) 25 MG tablet TAKE 1 TABLET(25 MG) BY MOUTH AT BEDTIME 30 tablet 3   amLODipine  (NORVASC ) 5 MG tablet TAKE 1 TABLET(5 MG) BY MOUTH DAILY 30 tablet 6   azelastine  (ASTELIN ) 0.1 % nasal spray Place 2 sprays into both nostrils daily. Use in each nostril as directed 30 mL 12   Blood Glucose Monitoring Suppl DEVI 1 each by Does not apply route in the morning, at noon, and at bedtime. May substitute to any manufacturer covered by patient's insurance. 1 each 0   celecoxib  (CELEBREX ) 200 MG capsule Take 1 capsule (200 mg total) by mouth 2 (two) times daily. 30 capsule 1   cetirizine  (ZYRTEC ) 10 MG tablet TAKE 1 TABLET(10 MG) BY MOUTH DAILY 30 tablet 5   doxycycline  (VIBRA -TABS) 100 MG tablet Take 1 tablet (100 mg total) by mouth 2 (two) times daily for 10 days. 20 tablet 0   DULoxetine  (CYMBALTA ) 60 MG capsule TAKE 1 CAPSULE(60 MG) BY MOUTH DAILY 90 capsule 0   fluticasone  (FLONASE ) 50 MCG/ACT nasal spray Place 2 sprays into both nostrils daily. 16 g 6   LORazepam  (ATIVAN ) 0.5 MG tablet Take 1 tablet 45 minutes prior to procedure. If persistent anxiety, take second tablet after waiting for  30 minutes. 2 tablet 0   naratriptan  (AMERGE) 2.5 MG tablet Take 1 tablet (2.5 mg total) by mouth as needed for migraine. Take one (1) tablet at onset of headache; if returns or does not resolve, may repeat after 4 hours; do not exceed five (5) mg in 24 hours. 10 tablet 11   norethindrone  (MICRONOR ) 0.35 MG tablet Take 1 tablet (0.35 mg total) by mouth daily. 84 tablet 1   OZEMPIC , 2 MG/DOSE, 8 MG/3ML SOPN INJECT 2 MG INTO THE SKIN EVERY 7 DAYS 3 mL 3   pantoprazole  (PROTONIX ) 40 MG tablet Take 1 tablet (40 mg total) by mouth daily. 90 tablet 1   Rimegepant Sulfate (NURTEC) 75 MG TBDP Take 1 tablet (75 mg total) by mouth daily as needed (for migraine). 16 tablet 1   rizatriptan  (MAXALT -MLT) 10 MG disintegrating tablet Take 1 tablet (10 mg total) by mouth as needed. May repeat in 2 hours if needed 12 tablet 6   tiZANidine  (ZANAFLEX ) 4 MG tablet TAKE 1 TABLET(4 MG) BY MOUTH EVERY 6 HOURS AS NEEDED 30 tablet  9   traMADol  (ULTRAM ) 50 MG tablet Take 1 tablet (50 mg total) by mouth every 12 (twelve) hours as needed. 30 tablet 0   vitamin B-12 (CYANOCOBALAMIN ) 500 MCG tablet Take 1 tablet (500 mcg total) by mouth daily. 100 tablet 1   Vitamin D , Ergocalciferol , (DRISDOL ) 1.25 MG (50000 UNIT) CAPS capsule TAKE 1 CAPSULE BY MOUTH EVERY 7 DAYS 12 capsule 0   No current facility-administered medications for this visit.    PAST MEDICAL HISTORY: Past Medical History:  Diagnosis Date   Abnormal chromosomal and genetic finding on antenatal screening mother 02/06/2020   On Horizon Nov 2021     Silent carrier for alpha-thalassemia (FOB____)  and increased carrier risk for SMA  (FOB____)   Anemia 12/30/2018   Blood in urine 10/18/2015   Condyloma of female genitalia 10/21/2012   Subclitoral, TCA 7/31    Depression    Diabetes mellitus without complication (HCC)    Fibromyalgia    Hx of hematuria    Hx of ovarian cyst    Hypertension    Irregular bleeding 12/02/2017   Kidney stone    History of kidney  stones   Large breasts    Lumbago 11/11/2011   Migraine     PAST SURGICAL HISTORY: Past Surgical History:  Procedure Laterality Date   DENTAL SURGERY     WISDOM TOOTH EXTRACTION Bilateral     FAMILY HISTORY: Family History  Problem Relation Age of Onset   Diabetes Paternal Grandmother    Hypertension Paternal Grandmother    Diabetes Maternal Grandmother    Heart disease Maternal Grandfather    Hypertension Father    Diabetes Mother    Hypertension Mother    Parkinson's disease Mother    Colon cancer Mother 13   Coronary artery disease Other    Diabetes Other     SOCIAL HISTORY: Social History   Socioeconomic History   Marital status: Significant Other    Spouse name: Not on file   Number of children: 2   Years of education: college   Highest education level: Master's degree (e.g., MA, MS, MEng, MEd, MSW, MBA)  Occupational History   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 Right-handed. 2-3 cups caffeine per day.   Social Drivers of Corporate investment banker Strain: Low Risk  (08/29/2020)   Overall Financial Resource Strain (CARDIA)    Difficulty of Paying Living Expenses: Not very hard  Food Insecurity: No Food Insecurity (08/29/2020)   Hunger Vital Sign    Worried About Running Out of Food in the Last Year: Never true    Ran Out of Food in the Last Year: Never true  Transportation Needs: No Transportation  Needs (08/29/2020)   PRAPARE - Transportation    Lack of Transportation (Medical): No    Lack  of Transportation (Non-Medical): No  Physical Activity: Inactive (08/29/2020)   Exercise Vital Sign    Days of Exercise per Week: 0 days    Minutes of Exercise per Session: 0 min  Stress: No Stress Concern Present (08/29/2020)   [employer] of Occupational Health - Occupational Stress Questionnaire    Feeling of Stress : Only a little  Social Connections: Moderately Isolated (08/29/2020)   Social Connection and Isolation Panel    Frequency of Communication with Friends and Family: Once a week    Frequency of Social Gatherings with Friends and Family: Never    Attends Religious Services: 1 to 4 times per year    Active Member of [employer] or Organizations: No    Attends Banker Meetings: Never    Marital Status: Living with partner  Intimate Partner Violence: Not At Risk (08/29/2020)   Humiliation, Afraid, Rape, and Kick questionnaire    Fear of Current or Ex-Partner: No    Emotionally Abused: No    Physically Abused: No    Sexually Abused: No      Modena Callander, M.D. Ph.D.  [facility] [address], [address] [place], [place] [address] Ph: [phone] Fax: [phone]  CC:  Tobie Suzzane POUR, MD [address] [place],  [place] [address]  Tobie Suzzane POUR, MD

## 2023-10-22 NOTE — Addendum Note (Signed)
 Addended by: JOSHUA IZETTA CROME on: 10/22/2023 01:46 PM   Modules accepted: Orders

## 2023-10-29 ENCOUNTER — Ambulatory Visit: Payer: Self-pay | Admitting: Neurology

## 2023-10-29 LAB — HGB A1C W/O EAG: Hgb A1c MFr Bld: 5.9 % — ABNORMAL HIGH (ref 4.8–5.6)

## 2023-10-29 LAB — COMPREHENSIVE METABOLIC PANEL WITH GFR
ALT: 16 IU/L (ref 0–32)
AST: 16 IU/L (ref 0–40)
Albumin: 4.3 g/dL (ref 3.9–4.9)
Alkaline Phosphatase: 93 IU/L (ref 44–121)
BUN/Creatinine Ratio: 11 (ref 9–23)
BUN: 8 mg/dL (ref 6–20)
Bilirubin Total: 0.2 mg/dL (ref 0.0–1.2)
CO2: 20 mmol/L (ref 20–29)
Calcium: 9.1 mg/dL (ref 8.7–10.2)
Chloride: 101 mmol/L (ref 96–106)
Creatinine, Ser: 0.73 mg/dL (ref 0.57–1.00)
Globulin, Total: 2.9 g/dL (ref 1.5–4.5)
Glucose: 60 mg/dL — ABNORMAL LOW (ref 70–99)
Potassium: 4.6 mmol/L (ref 3.5–5.2)
Sodium: 138 mmol/L (ref 134–144)
Total Protein: 7.2 g/dL (ref 6.0–8.5)
eGFR: 109 mL/min/1.73 (ref >59)

## 2023-10-29 LAB — DRUG SCREEN 10 W/CONF, SERUM

## 2023-10-29 LAB — C-REACTIVE PROTEIN: CRP: 26 mg/L — ABNORMAL HIGH (ref 0–10)

## 2023-10-29 LAB — CBC WITH DIFFERENTIAL/PLATELET
Basophils Absolute: 0 x10E3/uL (ref 0.0–0.2)
Basos: 0 %
EOS (ABSOLUTE): 0.2 x10E3/uL (ref 0.0–0.4)
Eos: 2 %
Hematocrit: 47.1 % — ABNORMAL HIGH (ref 34.0–46.6)
Hemoglobin: 14.2 g/dL (ref 11.1–15.9)
Immature Grans (Abs): 0 x10E3/uL (ref 0.0–0.1)
Immature Granulocytes: 0 %
Lymphocytes Absolute: 2.4 x10E3/uL (ref 0.7–3.1)
Lymphs: 35 %
MCH: 23.4 pg — ABNORMAL LOW (ref 26.6–33.0)
MCHC: 30.1 g/dL — ABNORMAL LOW (ref 31.5–35.7)
MCV: 78 fL — ABNORMAL LOW (ref 79–97)
Monocytes Absolute: 0.5 x10E3/uL (ref 0.1–0.9)
Monocytes: 8 %
Neutrophils Absolute: 3.8 x10E3/uL (ref 1.4–7.0)
Neutrophils: 55 %
Platelets: 378 x10E3/uL (ref 150–450)
RBC: 6.08 x10E6/uL — ABNORMAL HIGH (ref 3.77–5.28)
RDW: 14.7 % (ref 11.7–15.4)
WBC: 6.9 x10E3/uL (ref 3.4–10.8)

## 2023-10-29 LAB — MULTIPLE MYELOMA PANEL, SERUM
Albumin SerPl Elph-Mcnc: 3.6 g/dL (ref 2.9–4.4)
Albumin/Glob SerPl: 1.1 (ref 0.7–1.7)
Alpha 1: 0.2 g/dL (ref 0.0–0.4)
Alpha2 Glob SerPl Elph-Mcnc: 0.8 g/dL (ref 0.4–1.0)
B-Globulin SerPl Elph-Mcnc: 1.2 g/dL (ref 0.7–1.3)
Gamma Glob SerPl Elph-Mcnc: 1.3 g/dL (ref 0.4–1.8)
Globulin, Total: 3.6 g/dL (ref 2.2–3.9)
IgA/Immunoglobulin A, Serum: 221 mg/dL (ref 87–352)
IgG (Immunoglobin G), Serum: 1322 mg/dL (ref 586–1602)
IgM (Immunoglobulin M), Srm: 91 mg/dL (ref 26–217)

## 2023-10-29 LAB — HIV ANTIBODY (ROUTINE TESTING W REFLEX): HIV Screen 4th Generation wRfx: NONREACTIVE

## 2023-10-29 LAB — RPR: RPR Ser Ql: NONREACTIVE

## 2023-10-29 LAB — SEDIMENTATION RATE: Sed Rate: 20 mm/h (ref 0–32)

## 2023-10-29 LAB — CK: Total CK: 200 U/L — ABNORMAL HIGH (ref 32–182)

## 2023-10-29 LAB — TSH: TSH: 1.85 u[IU]/mL (ref 0.450–4.500)

## 2023-10-29 LAB — FOLATE: Folate: 7.4 ng/mL (ref >3.0)

## 2023-10-29 LAB — VITAMIN B12: Vitamin B-12: 465 pg/mL (ref 232–1245)

## 2023-10-29 LAB — ANA W/REFLEX IF POSITIVE: Anti Nuclear Antibody (ANA): NEGATIVE

## 2023-11-02 ENCOUNTER — Encounter: Payer: Self-pay | Admitting: Internal Medicine

## 2023-11-02 ENCOUNTER — Ambulatory Visit: Admitting: Internal Medicine

## 2023-11-02 VITALS — BP 124/78 | HR 95 | Ht 62.0 in | Wt 243.0 lb

## 2023-11-02 DIAGNOSIS — Z7985 Long-term (current) use of injectable non-insulin antidiabetic drugs: Secondary | ICD-10-CM | POA: Diagnosis not present

## 2023-11-02 DIAGNOSIS — E1169 Type 2 diabetes mellitus with other specified complication: Secondary | ICD-10-CM | POA: Diagnosis not present

## 2023-11-02 DIAGNOSIS — M797 Fibromyalgia: Secondary | ICD-10-CM

## 2023-11-02 DIAGNOSIS — G4733 Obstructive sleep apnea (adult) (pediatric): Secondary | ICD-10-CM | POA: Diagnosis not present

## 2023-11-02 DIAGNOSIS — G43E19 Chronic migraine with aura, intractable, without status migrainosus: Secondary | ICD-10-CM | POA: Diagnosis not present

## 2023-11-02 DIAGNOSIS — I10 Essential (primary) hypertension: Secondary | ICD-10-CM

## 2023-11-02 DIAGNOSIS — F418 Other specified anxiety disorders: Secondary | ICD-10-CM | POA: Diagnosis not present

## 2023-11-02 DIAGNOSIS — E1140 Type 2 diabetes mellitus with diabetic neuropathy, unspecified: Secondary | ICD-10-CM | POA: Diagnosis not present

## 2023-11-02 DIAGNOSIS — M4722 Other spondylosis with radiculopathy, cervical region: Secondary | ICD-10-CM

## 2023-11-02 NOTE — Progress Notes (Signed)
 Established Patient Office Visit  Subjective:  Patient ID: Theresa Rosario, female    DOB: 1986-02-14  Age: 38 y.o. MRN: [id number]  CC:  Chief Complaint  Patient presents with   Diabetes    4 month f/u    Fibromyalgia    HPI Theresa Rosario is a 38 y.o. female with past medical history of hypertension, type 2 DM, fibromyalgia, insomnia, OSA and morbid obesity who presents for f/u of her chronic medical conditions.  She still has  burning pain in bilateral hands and feet.  She reports tingling and pins and prick like sensations in feet, worse with standing and walking. She has noticed dizzy spells as well. She has been evaluated by Neurology. Her MRI of brain was negative for any acute etiology. Her MRI of cervical spine showed cervical spinal stenosis, for which she is going to see Spine specialist. Of note, she has chronic low back pain, but denies any recent worsening of the back pain.   HTN: BP is well-controlled. Takes amlodipine  5 mg QD regularly. Patient denies chest pain, dyspnea or palpitations.  Type II DM: Her HbA1C was 5.9 in 07/25.  She has started taking Ozempic  2 mg qw and has been tolerating it well.  She had lost about 32 lbs since starting Ozempic , but has been overall stable since the last visit.  She has been trying to follow low-carb diet, but admits that she still needs to improve her diet.  She has chronic fatigue, but denies any polyuria or polyphagia currently.  Fibromyalgia: She has been taking Elavil  25 mg QD and has noticed improvement in her sleep, myalgias and her migraine frequency has also improved.  She still reports sleep interruptions.  She is also taking tizanidine  4 mg nightly and Cymbalta  60 mg daily.  She takes tramadol  for acute flareups, and needs it only on few days in a month. She has seen [facility] neurology for migraine.  She had clonazepam PRN for anxiety, which was prescribed by her previous Neurologist, but does not have it now.  She  has tried BuSpar in the past.  She has hair pulling episodes during anxiety spells.  Denies any SI or HI currently. She takes Elavil  and Cymbalta  currently.   Past Medical History:  Diagnosis Date   Abnormal chromosomal and genetic finding on antenatal screening mother 02/06/2020   On Horizon Nov 2021     Silent carrier for alpha-thalassemia (FOB____)  and increased carrier risk for SMA  (FOB____)   Anemia 12/30/2018   Blood in urine 10/18/2015   Condyloma of female genitalia 10/21/2012   Subclitoral, TCA 7/31    Depression    Diabetes mellitus without complication (HCC)    Fibromyalgia    Hx of hematuria    Hx of ovarian cyst    Hypertension    Irregular bleeding 12/02/2017   Kidney stone    History of kidney stones   Large breasts    Lumbago 11/11/2011   Migraine     Past Surgical History:  Procedure Laterality Date   DENTAL SURGERY     WISDOM TOOTH EXTRACTION Bilateral     Family History  Problem Relation Age of Onset   Diabetes Paternal Grandmother    Hypertension Paternal Grandmother    Diabetes Maternal Grandmother    Heart disease Maternal Grandfather    Hypertension Father    Diabetes Mother    Hypertension Mother    Parkinson's disease Mother    Colon cancer Mother 39  Coronary artery disease Other    Diabetes Other     Social History   Socioeconomic History   Marital status: Significant Other    Spouse name: Not on file   Number of children: 2   Years of education: college   Highest education level: Master's degree (e.g., MA, MS, MEng, MEd, MSW, MBA)  Occupational History   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 Right-handed. 2-3 cups caffeine per day.   Social Drivers of Corporate investment banker Strain: Low Risk  (08/29/2020)   Overall Financial Resource Strain (CARDIA)    Difficulty of Paying Living Expenses: Not very hard  Food Insecurity: No Food Insecurity (08/29/2020)   Hunger Vital Sign    Worried About Running Out of Food in the Last Year: Never true    Ran Out of Food in the Last Year: Never true  Transportation Needs: No Transportation Needs (08/29/2020)   PRAPARE - Administrator, Civil Service (Medical): No    Lack of Transportation (Non-Medical): No  Physical Activity: Inactive (08/29/2020)   Exercise Vital Sign    Days of Exercise per Week: 0 days    Minutes of Exercise per Session: 0 min  Stress: No Stress Concern Present (08/29/2020)   [employer] of Occupational Health - Occupational Stress Questionnaire    Feeling of Stress : Only a little  Social Connections: Moderately Isolated (08/29/2020)   Social Connection and Isolation Panel    Frequency of Communication with Friends and Family: Once a week    Frequency of Social Gatherings with Friends and Family: Never    Attends Religious Services: 1 to 4 times per year    Active Member of [employer] or Organizations: No    Attends Banker Meetings: Never    Marital Status: Living with partner  Intimate Partner Violence: Not At Risk (08/29/2020)   Humiliation, Afraid, Rape, and Kick questionnaire    Fear of Current or Ex-Partner: No    Emotionally Abused: No    Physically Abused: No    Sexually Abused: No    Outpatient Medications Prior to Visit  Medication Sig Dispense Refill   ACCU-CHEK GUIDE TEST test strip USE TO TEST BLOOD GLUCOSE EVERY MORNING, AT NOON AND EVERY NIGHT AT BEDTIME 100  strip 0   Accu-Chek Softclix Lancets lancets USE TO TEST BLOOD GLUCOSE EVERY MORNING, AT NOON AND EVERY NIGHT AT BEDTIME 100 each 0   acetaminophen  (TYLENOL ) 500 MG tablet Take 2 tablets (1,000 mg total) by mouth every 6 (six) hours. 30 tablet 0   amitriptyline  (ELAVIL ) 25 MG tablet Take 2 tablets (50 mg total) by mouth at bedtime. 60 tablet 11   amLODipine  (NORVASC ) 5 MG tablet TAKE 1 TABLET(5 MG) BY MOUTH DAILY 30 tablet 6   azelastine  (ASTELIN )  0.1 % nasal spray Place 2 sprays into both nostrils daily. Use in each nostril as directed 30 mL 12   Blood Glucose Monitoring Suppl DEVI 1 each by Does not apply route in the morning, at noon, and at bedtime. May substitute to any manufacturer covered by patient's insurance. 1 each 0   celecoxib  (CELEBREX ) 200 MG capsule Take 1 capsule (200 mg total) by mouth 2 (two) times daily. 30 capsule 1   cetirizine  (ZYRTEC ) 10 MG tablet TAKE 1 TABLET(10 MG) BY MOUTH DAILY 30 tablet 5   DULoxetine  (CYMBALTA ) 60 MG capsule TAKE 1 CAPSULE(60 MG) BY MOUTH DAILY 90 capsule 0   fluticasone  (FLONASE ) 50 MCG/ACT nasal spray Place 2 sprays into both nostrils daily. 16 g 6   naratriptan  (AMERGE) 2.5 MG tablet Take 1 tablet (2.5 mg total) by mouth as needed for migraine. Take one (1) tablet at onset of headache; if returns or does not resolve, may repeat after 4 hours; do not exceed five (5) mg in 24 hours. 10 tablet 11   norethindrone  (MICRONOR ) 0.35 MG tablet Take 1 tablet (0.35 mg total) by mouth daily. 84 tablet 1   OZEMPIC , 2 MG/DOSE, 8 MG/3ML SOPN INJECT 2 MG INTO THE SKIN EVERY 7 DAYS 3 mL 3   pantoprazole  (PROTONIX ) 40 MG tablet Take 1 tablet (40 mg total) by mouth daily. 90 tablet 1   Rimegepant Sulfate (NURTEC) 75 MG TBDP Take 1 tablet (75 mg total) by mouth daily as needed (for migraine). 16 tablet 1   rizatriptan  (MAXALT -MLT) 10 MG disintegrating tablet Take 1 tablet (10 mg total) by mouth as needed. May repeat in 2 hours if needed 12 tablet 6   tiZANidine   (ZANAFLEX ) 4 MG tablet TAKE 1 TABLET(4 MG) BY MOUTH EVERY 6 HOURS AS NEEDED 30 tablet 9   traMADol  (ULTRAM ) 50 MG tablet Take 1 tablet (50 mg total) by mouth every 12 (twelve) hours as needed. 30 tablet 0   vitamin B-12 (CYANOCOBALAMIN ) 500 MCG tablet Take 1 tablet (500 mcg total) by mouth daily. 100 tablet 1   Vitamin D , Ergocalciferol , (DRISDOL ) 1.25 MG (50000 UNIT) CAPS capsule TAKE 1 CAPSULE BY MOUTH EVERY 7 DAYS 12 capsule 0   LORazepam  (ATIVAN ) 0.5 MG tablet Take 1 tablet 45 minutes prior to procedure. If persistent anxiety, take second tablet after waiting for 30 minutes. 2 tablet 0   No facility-administered medications prior to visit.    Allergies  Allergen Reactions   Amoxicillin Hives   Banana Itching    Tongue itching   Motrin  [Ibuprofen ] Rash    MOTRIN  (Brand only) causes this allergy (tolerates ADVIL  brand)   Other Itching    Tree nuts cause tongue itching    Pineapple Itching    Tongue itching    ROS Review of Systems  Constitutional:  Negative for chills and fever.  HENT:  Negative for sinus pressure, sinus pain and sore throat.   Eyes:  Negative for pain and discharge.  Respiratory:  Negative for cough and shortness of breath.   Cardiovascular:  Negative for chest pain and palpitations.  Gastrointestinal:  Positive for constipation. Negative for diarrhea, nausea and vomiting.  Endocrine: Negative for polydipsia and polyuria.  Genitourinary:  Negative for dysuria and hematuria.  Musculoskeletal:  Positive for arthralgias, back pain and myalgias. Negative for neck pain and neck stiffness.  Skin:  Negative for rash.  Neurological:  Positive for dizziness, numbness (B/l hands and feet) and headaches. Negative for syncope and weakness.  Psychiatric/Behavioral:  Positive for sleep disturbance. Negative for agitation and behavioral problems.       Objective:    Physical Exam Vitals reviewed.  Constitutional:      General: She is not in acute distress.     Appearance: She is obese. She is not diaphoretic.  HENT:     Head: Normocephalic and atraumatic.     Nose: Nose normal.     Mouth/Throat:     Mouth: Mucous membranes are moist.  Eyes:     General: No scleral icterus.    Extraocular Movements: Extraocular movements intact.  Cardiovascular:     Rate and Rhythm: Normal rate and regular rhythm.     Heart sounds: Normal heart sounds. No murmur heard. Pulmonary:     Breath sounds: Normal breath sounds. No wheezing or rales.  Abdominal:     Palpations: Abdomen is soft.     Tenderness: There is no abdominal tenderness.  Musculoskeletal:     Cervical back: Neck supple. No tenderness.     Right lower leg: No edema.     Left lower leg: No edema.  Skin:    General: Skin is warm.     Findings: No rash.  Neurological:     General: No focal deficit present.     Mental Status: She is alert and oriented to person, place, and time.     Cranial Nerves: No cranial nerve deficit.     Sensory: Sensory deficit (Intermittent - b/l hands and feet) present.     Motor: No weakness.  Psychiatric:        Mood and Affect: Mood normal.        Behavior: Behavior normal.     BP 124/78   Pulse 95   Ht 5' 2 (1.575 m)   Wt 243 lb (110.2 kg)   LMP  (LMP Unknown)   SpO2 99%   BMI 44.45 kg/m  Wt Readings from Last 3 Encounters:  11/02/23 243 lb (110.2 kg)  10/22/23 242 lb 8 oz (110 kg)  10/15/23 242 lb (109.8 kg)    Lab Results  Component Value Date   TSH 1.850 10/22/2023   Lab Results  Component Value Date   WBC 6.9 10/22/2023   HGB 14.2 10/22/2023   HCT 47.1 (H) 10/22/2023   MCV 78 (L) 10/22/2023   PLT 378 10/22/2023   Lab Results  Component Value Date   NA 138 10/22/2023   K 4.6 10/22/2023   CO2 20 10/22/2023   GLUCOSE 60 (L) 10/22/2023   BUN 8 10/22/2023   CREATININE 0.73 10/22/2023   BILITOT 0.2 10/22/2023   ALKPHOS 93 10/22/2023   AST 16 10/22/2023   ALT 16 10/22/2023   PROT 7.2 10/22/2023   ALBUMIN  4.3 10/22/2023    CALCIUM  9.1 10/22/2023   ANIONGAP 9 07/22/2020   EGFR 109 10/22/2023   Lab Results  Component Value Date   CHOL 139 06/23/2022   Lab Results  Component Value Date   HDL 34 (L) 06/23/2022   Lab Results  Component Value Date   LDLCALC 89 06/23/2022   Lab Results  Component Value Date   TRIG 81 06/23/2022   Lab Results  Component Value Date   CHOLHDL 4.1 06/23/2022   Lab Results  Component Value Date   HGBA1C 5.9 (H) 10/22/2023      Assessment & Plan:   Problem List Items Addressed This Visit       Cardiovascular and Mediastinum   Migraine   Well controlled currently  Has Maxalt  and Nurtec as needed for breakthrough headache Elavil  has likely improved her migraine frequency Followed by neurology      Relevant Orders   TSH + free T4   Essential hypertension   BP Readings from Last 1 Encounters:  11/02/23 124/78   Well-controlled with amlodipine  5 mg daily Counseled for compliance with the medications Advised to follow DASH diet and perform moderate exercise/walking at least 150 minutes/week      Relevant Orders   CBC with Differential/Platelet   CMP14+EGFR   TSH + free T4     Respiratory   Obstructive sleep apnea of adult   Advised to stay compliant with CPAP, has nasal mask She needs to focus on weight loss bu following low carb diet If she does not lose weight with Ozempic , can consider Mounjaro, but coverage is a concern        Endocrine   Type 2 diabetes mellitus with other specified complication (HCC)   Lab Results  Component Value Date   HGBA1C 5.9 (H) 10/22/2023   Well-controlled Associated with HTN and OSA Had added Ozempic  for insulin resistance and weight loss benefit - 2 mg qw Advised to follow diabetic diet F/u CMP and lipid panel Diabetic eye exam: Advised to follow up with Ophthalmology for diabetic eye exam      Relevant Orders   Hemoglobin A1c   Type 2 diabetes mellitus with diabetic neuropathy, without long-term current  use of insulin (HCC) - Primary   Subjective feeling of numbness and tingling of bilateral hands and feet Could be due to diabetic neuropathy, continue Cymbalta  and Elavil  for now Used to take gabapentin, but was held due to hypersensitivity reaction Checked CBC, CMP, TSH, B12 levels      Relevant Orders   Microalbumin / creatinine urine ratio (Completed)   For Home Use Only DME Diabetic Shoe   TSH + free T4   Hemoglobin A1c     Nervous and Auditory   Cervical spondylosis with radiculopathy   Chronic neck pain, radiating to bilateral UE Has numbness and tingling of bilateral UE and LE Checked MRI of cervical spine Celebrex  200 mg as needed after completing oral prednisone  Tramadol  as needed for severe pain Tizanidine  as needed for muscle spasms Has been referred to spine surgery        Other   Fibromyalgia   Had added Elavil , has improved sleep and myalgias- later increased dose to 25 mg at bedtime to improve sleep quality On Cymbalta  60 mg QD and tramadol  50 mg PRN, will continue for now      Depression with anxiety   On Cymbalta  and Elavil  Used to have clonazepam, which was prescribed by her previous Neurologist If persistent anxiety or panic episode, will discuss about Clonazepam PRN         No orders of the defined types were placed in this encounter.   Follow-up: Return in about 4 months (around 03/03/2024) for DM and migraine.    Theresa MARLA Blanch, MD

## 2023-11-02 NOTE — Assessment & Plan Note (Signed)
 Subjective feeling of numbness and tingling of bilateral hands and feet Could be due to diabetic neuropathy, continue Cymbalta  and Elavil  for now Used to take gabapentin, but was held due to hypersensitivity reaction Checked CBC, CMP, TSH, B12 levels

## 2023-11-02 NOTE — Patient Instructions (Signed)
 Please continue to take medications as prescribed.  Please continue to follow low carb diet and perform moderate exercise/walking at least 150 mins/week.  Please get fasting blood tests done before the next visit.

## 2023-11-02 NOTE — Assessment & Plan Note (Signed)
 Lab Results  Component Value Date   HGBA1C 5.9 (H) 10/22/2023   Well-controlled Associated with HTN and OSA Had added Ozempic  for insulin resistance and weight loss benefit - 2 mg qw Advised to follow diabetic diet F/u CMP and lipid panel Diabetic eye exam: Advised to follow up with Ophthalmology for diabetic eye exam

## 2023-11-02 NOTE — Assessment & Plan Note (Addendum)
 Advised to stay compliant with CPAP, has nasal mask She needs to focus on weight loss bu following low carb diet If she does not lose weight with Ozempic , can consider Mounjaro, but coverage is a concern

## 2023-11-02 NOTE — Assessment & Plan Note (Signed)
 Well controlled currently Has Maxalt and Nurtec as needed for breakthrough headache Elavil has likely improved her migraine frequency Followed by neurology

## 2023-11-02 NOTE — Assessment & Plan Note (Signed)
 BP Readings from Last 1 Encounters:  11/02/23 124/78   Well-controlled with amlodipine  5 mg daily Counseled for compliance with the medications Advised to follow DASH diet and perform moderate exercise/walking at least 150 minutes/week

## 2023-11-04 LAB — MICROALBUMIN / CREATININE URINE RATIO
Creatinine, Urine: 162.3 mg/dL
Microalb/Creat Ratio: 3 mg/g{creat} (ref 0–29)
Microalbumin, Urine: 4.8 ug/mL

## 2023-11-06 NOTE — Assessment & Plan Note (Signed)
 On Cymbalta  and Elavil  Used to have clonazepam, which was prescribed by her previous Neurologist If persistent anxiety or panic episode, will discuss about Clonazepam PRN

## 2023-11-06 NOTE — Assessment & Plan Note (Signed)
 Had added Elavil, has improved sleep and myalgias- later increased dose to 25 mg at bedtime to improve sleep quality On Cymbalta 60 mg QD and tramadol 50 mg PRN, will continue for now

## 2023-11-06 NOTE — Assessment & Plan Note (Signed)
 Chronic neck pain, radiating to bilateral UE Has numbness and tingling of bilateral UE and LE Checked MRI of cervical spine Celebrex  200 mg as needed after completing oral prednisone  Tramadol  as needed for severe pain Tizanidine  as needed for muscle spasms Has been referred to spine surgery

## 2023-11-16 ENCOUNTER — Other Ambulatory Visit: Payer: Self-pay | Admitting: Internal Medicine

## 2023-11-16 DIAGNOSIS — E559 Vitamin D deficiency, unspecified: Secondary | ICD-10-CM

## 2023-11-16 DIAGNOSIS — E1169 Type 2 diabetes mellitus with other specified complication: Secondary | ICD-10-CM

## 2023-11-26 ENCOUNTER — Encounter: Payer: Self-pay | Admitting: Internal Medicine

## 2023-12-03 ENCOUNTER — Other Ambulatory Visit: Payer: Self-pay

## 2023-12-03 MED ORDER — TIZANIDINE HCL 4 MG PO TABS: 4.0000 mg | ORAL_TABLET | Freq: Four times a day (QID) | ORAL | 0 refills | Status: DC | PRN

## 2023-12-14 ENCOUNTER — Other Ambulatory Visit: Payer: Self-pay | Admitting: Internal Medicine

## 2023-12-14 DIAGNOSIS — M797 Fibromyalgia: Secondary | ICD-10-CM

## 2024-01-03 ENCOUNTER — Other Ambulatory Visit: Payer: Self-pay | Admitting: Internal Medicine

## 2024-01-03 DIAGNOSIS — M797 Fibromyalgia: Secondary | ICD-10-CM

## 2024-01-19 ENCOUNTER — Ambulatory Visit (INDEPENDENT_AMBULATORY_CARE_PROVIDER_SITE_OTHER): Admitting: Otolaryngology

## 2024-01-22 ENCOUNTER — Other Ambulatory Visit: Payer: Self-pay | Admitting: Neurology

## 2024-01-29 DIAGNOSIS — M7711 Lateral epicondylitis, right elbow: Secondary | ICD-10-CM | POA: Diagnosis not present

## 2024-02-01 ENCOUNTER — Other Ambulatory Visit: Payer: Self-pay | Admitting: Internal Medicine

## 2024-02-01 ENCOUNTER — Telehealth: Payer: Self-pay

## 2024-02-01 DIAGNOSIS — M797 Fibromyalgia: Secondary | ICD-10-CM

## 2024-02-01 NOTE — Telephone Encounter (Signed)
 Copied from CRM [id number]. Topic: Clinical - Prescription Issue >> Feb 01, 2024  4:56 PM Tysheama G wrote: Reason for CRM: Patient switched her pharmacy because the one she was going to kept filling it late but she's been without her medication for a week now. And did submit a new one for the new pharmacy for medication DULoxetine  (CYMBALTA ) 60 MG, can we get that refill ASAP please. And sent to [facility] [phone] - [place], [place] - [address] AT [facility] OF [address] [place] [address] Phone: [phone] Fax: [phone] Hours: Not open 24 hours

## 2024-02-01 NOTE — Telephone Encounter (Unsigned)
 Copied from CRM [id number]. Topic: Clinical - Medication Refill >> Feb 01, 2024  4:55 PM Theresa Rosario wrote: Medication: DULoxetine  (CYMBALTA ) 60 MG  Has the patient contacted their pharmacy? Yes (Agent: If no, request that the patient contact the pharmacy for the refill. If patient does not wish to contact the pharmacy document the reason why and proceed with request.) (Agent: If yes, when and what did the pharmacy advise?)  This is the patient's preferred pharmacy:  [facility] [phone] - [place], [place] - [address] AT [facility] OF [address] [place] [address] Phone: [phone] Fax: [phone]  Is this the correct pharmacy for this prescription? Yes If no, delete pharmacy and type the correct one.   Has the prescription been filled recently? No  Is the patient out of the medication? Yes  Has the patient been seen for an appointment in the last year OR does the patient have an upcoming appointment? Yes  Can we respond through MyChart? Yes  Agent: Please be advised that Rx refills may take up to 3 business days. We ask that you follow-up with your pharmacy.

## 2024-02-02 MED ORDER — DULOXETINE HCL 60 MG PO CPEP
60.0000 mg | ORAL_CAPSULE | Freq: Every day | ORAL | 0 refills | Status: AC
Start: 2024-02-02 — End: 2024-05-02

## 2024-02-02 NOTE — Telephone Encounter (Signed)
 Refills sent to pharmacy.

## 2024-03-02 ENCOUNTER — Other Ambulatory Visit: Payer: Self-pay | Admitting: Adult Health

## 2024-03-03 ENCOUNTER — Ambulatory Visit: Admitting: Internal Medicine

## 2024-03-03 ENCOUNTER — Encounter: Payer: Self-pay | Admitting: Internal Medicine

## 2024-03-03 VITALS — BP 112/69 | HR 67 | Ht 62.0 in | Wt 247.2 lb

## 2024-03-03 DIAGNOSIS — I10 Essential (primary) hypertension: Secondary | ICD-10-CM

## 2024-03-03 DIAGNOSIS — E1140 Type 2 diabetes mellitus with diabetic neuropathy, unspecified: Secondary | ICD-10-CM

## 2024-03-03 DIAGNOSIS — Z23 Encounter for immunization: Secondary | ICD-10-CM

## 2024-03-03 DIAGNOSIS — M797 Fibromyalgia: Secondary | ICD-10-CM

## 2024-03-03 DIAGNOSIS — E1169 Type 2 diabetes mellitus with other specified complication: Secondary | ICD-10-CM

## 2024-03-03 DIAGNOSIS — E538 Deficiency of other specified B group vitamins: Secondary | ICD-10-CM | POA: Diagnosis not present

## 2024-03-03 DIAGNOSIS — Z7985 Long-term (current) use of injectable non-insulin antidiabetic drugs: Secondary | ICD-10-CM | POA: Diagnosis not present

## 2024-03-03 DIAGNOSIS — Z6841 Body Mass Index (BMI) 40.0 and over, adult: Secondary | ICD-10-CM | POA: Diagnosis not present

## 2024-03-03 DIAGNOSIS — G43E19 Chronic migraine with aura, intractable, without status migrainosus: Secondary | ICD-10-CM

## 2024-03-03 DIAGNOSIS — G4733 Obstructive sleep apnea (adult) (pediatric): Secondary | ICD-10-CM

## 2024-03-03 MED ORDER — OZEMPIC (2 MG/DOSE) 8 MG/3ML ~~LOC~~ SOPN: 2.0000 mg | PEN_INJECTOR | SUBCUTANEOUS | 3 refills | Status: AC

## 2024-03-03 MED ORDER — CYANOCOBALAMIN 1000 MCG/ML IJ SOLN
1000.0000 ug | Freq: Once | INTRAMUSCULAR | Status: AC
  Administered 2024-03-03: 1000 ug via INTRAMUSCULAR

## 2024-03-03 NOTE — Assessment & Plan Note (Addendum)
 Had added Elavil , has improved sleep and myalgias- later increased dose to 50 mg at bedtime to improve sleep quality On Cymbalta  60 mg QD and tramadol  50 mg PRN, will continue for now Tizanidine  4 mg PRN

## 2024-03-03 NOTE — Assessment & Plan Note (Signed)
 Well controlled currently Has Maxalt and Nurtec as needed for breakthrough headache Elavil has likely improved her migraine frequency Followed by neurology

## 2024-03-03 NOTE — Patient Instructions (Signed)
 Please continue to take medications as prescribed.  Please continue to follow low carb diet and perform moderate exercise/walking at least 150 mins/week.

## 2024-03-03 NOTE — Assessment & Plan Note (Signed)
 B12 deficiency can give neuropathic symptoms B12 injection today Advised to take oral vitamin B12 500 mcg QD

## 2024-03-03 NOTE — Assessment & Plan Note (Signed)
 BMI Readings from Last 3 Encounters:  03/03/24 45.21 kg/m  11/02/23 44.45 kg/m  10/22/23 44.35 kg/m   Associated with HTN, DM and OSA Diet modification and moderate exercise advised On Ozempic  for type II DM Has lost 32 lbs since starting Ozempic , but has plateaued now - needs to follow strict low carb diet

## 2024-03-03 NOTE — Assessment & Plan Note (Signed)
 Lab Results  Component Value Date   HGBA1C 5.9 (H) 10/22/2023   Well-controlled Associated with HTN and OSA Had added Ozempic  for insulin resistance and weight loss benefit - 2 mg qw Advised to follow diabetic diet F/u CMP and lipid panel Diabetic eye exam: Advised to follow up with Ophthalmology for diabetic eye exam

## 2024-03-03 NOTE — Assessment & Plan Note (Addendum)
 Subjective feeling of numbness and tingling of bilateral hands and feet - better now Could be due to diabetic neuropathy, continue Cymbalta  and Elavil  for now Used to take gabapentin, but was held due to hypersensitivity reaction Checked CBC, CMP, TSH, B12 levels

## 2024-03-03 NOTE — Progress Notes (Signed)
 Established Patient Office Visit  Subjective:  Patient ID: Theresa Rosario, female    DOB: 30-Jun-1985  Age: 38 y.o. MRN: [id number]  CC:  Chief Complaint  Patient presents with   Diabetes    Follow up   Migraine    Follow up    HPI Theresa Rosario is a 38 y.o. female with past medical history of hypertension, type 2 DM, fibromyalgia, insomnia, OSA and morbid obesity who presents for f/u of her chronic medical conditions.  She still has burning pain in bilateral hands and feet, but better with Elavil  50 mg now.  She has been evaluated by Neurology. Her MRI of brain was negative for any acute etiology. Her MRI of cervical spine showed cervical spinal stenosis, for which she was evaluated by spine specialist. Of note, she has chronic low back pain, but denies any recent worsening of the back pain.   HTN: BP is well-controlled. Takes amlodipine  5 mg QD regularly. Patient denies chest pain, dyspnea or palpitations.  Type II DM: Her HbA1C was 5.9 in 07/25.  She has been taking Ozempic  2 mg qw and has been tolerating it well.  She had lost about 32 lbs since starting Ozempic , but has been overall stable since the last visit.  She has been trying to follow low-carb diet. She has chronic fatigue, but denies any polyuria or polyphagia currently.  Fibromyalgia: She has been taking Elavil  50 mg QD and has noticed improvement in her sleep, myalgias and her migraine frequency has also improved.  She still reports sleep interruptions.  She is also taking tizanidine  4 mg nightly and Cymbalta  60 mg daily.  She takes tramadol  for acute flareups, and needs it only on few days in a month. She has seen [facility] neurology for migraine.  She had clonazepam PRN for anxiety, which was prescribed by her previous Neurologist, but does not take it now.  She has tried BuSpar in the past.  She has hair pulling episodes during anxiety spells.  Denies any SI or HI currently. She takes Elavil  and Cymbalta   currently.   Past Medical History:  Diagnosis Date   Abnormal chromosomal and genetic finding on antenatal screening mother 02/06/2020   On Horizon Nov 2021     Silent carrier for alpha-thalassemia (FOB____)  and increased carrier risk for SMA  (FOB____)   Anemia 12/30/2018   Blood in urine 10/18/2015   Condyloma of female genitalia 10/21/2012   Subclitoral, TCA 7/31    Depression    Diabetes mellitus without complication (HCC)    Fibromyalgia    Hx of hematuria    Hx of ovarian cyst    Hypertension    Irregular bleeding 12/02/2017   Kidney stone    History of kidney stones   Large breasts    Lumbago 11/11/2011   Migraine     Past Surgical History:  Procedure Laterality Date   DENTAL SURGERY     WISDOM TOOTH EXTRACTION Bilateral     Family History  Problem Relation Age of Onset   Diabetes Paternal Grandmother    Hypertension Paternal Grandmother    Diabetes Maternal Grandmother    Heart disease Maternal Grandfather    Hypertension Father    Diabetes Mother    Hypertension Mother    Parkinson's disease Mother    Colon cancer Mother 51   Coronary artery disease Other    Diabetes Other     Social History   Socioeconomic History   Marital status: Significant Other  Spouse name: Not on file   Number of children: 2   Years of education: college   Highest education level: Master's degree (e.g., MA, MS, MEng, MEd, MSW, MBA)  Occupational History   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 Right-handed. 2-3 cups caffeine per day.   Social Drivers of Health   Tobacco Use: Medium Risk (03/03/2024)   Patient History    Smoking Tobacco Use: Former    Smokeless Tobacco Use: Never    Passive Exposure: Not on Actuary Strain: Not on file  Food Insecurity: Not on file  Transportation Needs: Not on file  Physical Activity: Not on file  Stress: Not on file  Social Connections: Not on file  Intimate Partner Violence: Not on file  Depression (PHQ2-9): Low Risk (03/03/2024)   Depression (PHQ2-9)    PHQ-2 Score: 0  Alcohol Screen: Not on file  Housing: Not on file  Utilities: Not on file  Health Literacy: Not on file    Outpatient Medications Prior to Visit  Medication Sig Dispense Refill   ACCU-CHEK GUIDE TEST test strip USE TO TEST BLOOD GLUCOSE EVERY MORNING, AT NOON AND EVERY NIGHT AT BEDTIME 100 strip 0   Accu-Chek Softclix Lancets lancets USE TO TEST BLOOD GLUCOSE EVERY MORNING, AT NOON AND EVERY NIGHT AT BEDTIME 100 each 0   acetaminophen  (TYLENOL ) 500 MG tablet Take 2 tablets (1,000 mg total) by mouth every 6 (six) hours. 30 tablet 0   amitriptyline  (ELAVIL ) 25 MG tablet Take 2 tablets (50 mg total) by mouth at bedtime. 60 tablet 11   amLODipine  (NORVASC ) 5 MG tablet TAKE 1 TABLET(5 MG) BY MOUTH DAILY 30 tablet 6   azelastine  (ASTELIN ) 0.1 % nasal spray Place 2 sprays into both nostrils daily. Use in each nostril as directed 30 mL 12   Blood Glucose Monitoring Suppl DEVI 1 each by Does not apply route in the morning, at noon, and at bedtime. May substitute to any manufacturer covered by patient's insurance. 1 each 0   celecoxib  (CELEBREX ) 200 MG capsule TAKE 1 CAPSULE(200 MG) BY MOUTH TWICE DAILY 30 capsule 1   cetirizine  (ZYRTEC ) 10 MG tablet TAKE 1 TABLET(10 MG) BY MOUTH DAILY 30 tablet 5   DULoxetine  (CYMBALTA ) 60 MG capsule Take 1 capsule (60 mg total) by mouth daily. 90  capsule 0   fluticasone  (FLONASE ) 50 MCG/ACT nasal spray Place 2 sprays into both nostrils daily. 16 g 6   naratriptan  (AMERGE) 2.5 MG tablet Take 1 tablet (2.5 mg total) by mouth as needed for migraine. Take one (1) tablet at onset of headache; if returns or does not resolve, may repeat after 4 hours; do not exceed five (5) mg in 24 hours. 10 tablet 11   norethindrone  (MICRONOR ) 0.35 MG tablet Take 1 tablet (0.35  mg total) by mouth daily. 84 tablet 1   pantoprazole  (PROTONIX ) 40 MG tablet Take 1 tablet (40 mg total) by mouth daily. 90 tablet 1   Rimegepant Sulfate (NURTEC) 75 MG TBDP Take 1 tablet (75 mg total) by mouth daily as needed (for migraine). 16 tablet 1   rizatriptan  (MAXALT -MLT) 10 MG disintegrating tablet Take 1 tablet (10 mg total) by mouth as needed. May repeat in 2 hours if needed 12 tablet 6   tiZANidine  (ZANAFLEX ) 4 MG tablet TAKE 1 TABLET(4 MG) BY MOUTH EVERY 6 HOURS AS NEEDED FOR MUSCLE SPASMS 30 tablet 11   traMADol  (ULTRAM ) 50 MG tablet Take 1 tablet (50 mg total) by mouth every 12 (twelve) hours as needed. 30 tablet 0   vitamin B-12 (CYANOCOBALAMIN ) 500 MCG tablet Take 1 tablet (500 mcg total) by mouth daily. 100 tablet 1   Vitamin D , Ergocalciferol , (DRISDOL ) 1.25 MG (50000 UNIT) CAPS capsule TAKE 1 CAPSULE BY MOUTH EVERY 7 DAYS 12 capsule 1   OZEMPIC , 2 MG/DOSE, 8 MG/3ML SOPN INJECT 2 MG INTO THE SKIN EVERY 7 DAYS 3 mL 3   No facility-administered medications prior to visit.    Allergies  Allergen Reactions   Amoxicillin Hives   Banana Itching    Tongue itching   Motrin  [Ibuprofen ] Rash    MOTRIN  (Brand only) causes this allergy (tolerates ADVIL  brand)   Other Itching    Tree nuts cause tongue itching    Pineapple Itching    Tongue itching    ROS Review of Systems  Constitutional:  Negative for chills and fever.  HENT:  Negative for sinus pressure, sinus pain and sore throat.   Eyes:  Negative for pain and discharge.  Respiratory:  Negative for cough and  shortness of breath.   Cardiovascular:  Negative for chest pain and palpitations.  Gastrointestinal:  Positive for constipation. Negative for diarrhea, nausea and vomiting.  Endocrine: Negative for polydipsia and polyuria.  Genitourinary:  Negative for dysuria and hematuria.  Musculoskeletal:  Positive for arthralgias, back pain and myalgias. Negative for neck pain and neck stiffness.  Skin:  Negative for rash.  Neurological:  Positive for numbness (B/l hands and feet) and headaches. Negative for dizziness, syncope and weakness.  Psychiatric/Behavioral:  Negative for agitation and behavioral problems.       Objective:    Physical Exam Vitals reviewed.  Constitutional:      General: She is not in acute distress.    Appearance: She is obese. She is not diaphoretic.  HENT:     Head: Normocephalic and atraumatic.     Nose: Nose normal.     Mouth/Throat:     Mouth: Mucous membranes are moist.  Eyes:     General: No scleral icterus.    Extraocular Movements: Extraocular movements intact.  Cardiovascular:     Rate and Rhythm: Normal rate and regular rhythm.     Heart sounds: Normal heart sounds. No murmur heard. Pulmonary:     Breath sounds: Normal breath sounds. No wheezing or rales.  Musculoskeletal:     Cervical back: Neck supple. No tenderness.     Right lower leg: No edema.     Left lower leg: No edema.  Skin:    General: Skin is warm.     Findings: No rash.  Neurological:     General: No focal deficit present.     Mental Status: She is alert and oriented to person, place, and time.     Cranial Nerves: No cranial  nerve deficit.     Sensory: Sensory deficit (Intermittent - b/l hands and feet) present.     Motor: No weakness.  Psychiatric:        Mood and Affect: Mood normal.        Behavior: Behavior normal.     BP 112/69   Pulse 67   Ht 5' 2 (1.575 m)   Wt 247 lb 3.2 oz (112.1 kg)   SpO2 99%   BMI 45.21 kg/m  Wt Readings from Last 3 Encounters:  03/03/24 247  lb 3.2 oz (112.1 kg)  11/02/23 243 lb (110.2 kg)  10/22/23 242 lb 8 oz (110 kg)    Lab Results  Component Value Date   TSH 1.850 10/22/2023   Lab Results  Component Value Date   WBC 6.9 10/22/2023   HGB 14.2 10/22/2023   HCT 47.1 (H) 10/22/2023   MCV 78 (L) 10/22/2023   PLT 378 10/22/2023   Lab Results  Component Value Date   NA 138 10/22/2023   K 4.6 10/22/2023   CO2 20 10/22/2023   GLUCOSE 60 (L) 10/22/2023   BUN 8 10/22/2023   CREATININE 0.73 10/22/2023   BILITOT 0.2 10/22/2023   ALKPHOS 93 10/22/2023   AST 16 10/22/2023   ALT 16 10/22/2023   PROT 7.2 10/22/2023   ALBUMIN  4.3 10/22/2023   CALCIUM  9.1 10/22/2023   ANIONGAP 9 07/22/2020   EGFR 109 10/22/2023   Lab Results  Component Value Date   CHOL 139 06/23/2022   Lab Results  Component Value Date   HDL 34 (L) 06/23/2022   Lab Results  Component Value Date   LDLCALC 89 06/23/2022   Lab Results  Component Value Date   TRIG 81 06/23/2022   Lab Results  Component Value Date   CHOLHDL 4.1 06/23/2022   Lab Results  Component Value Date   HGBA1C 5.9 (H) 10/22/2023      Assessment & Plan:   Problem List Items Addressed This Visit       Cardiovascular and Mediastinum   Migraine   Well controlled currently Has Maxalt  and Nurtec as needed for breakthrough headache Elavil  has likely improved her migraine frequency Followed by neurology      Essential hypertension   BP Readings from Last 1 Encounters:  03/03/24 112/69   Well-controlled with amlodipine  5 mg daily Counseled for compliance with the medications Advised to follow DASH diet and perform moderate exercise/walking at least 150 minutes/week        Respiratory   Obstructive sleep apnea of adult   Advised to stay compliant with CPAP, has nasal mask She needs to focus on weight loss by following low carb diet If she does not lose weight with Ozempic , can consider Mounjaro, but coverage is a concern  Had sleep study many years ago,  ordered home sleep study      Relevant Orders   Home sleep test     Endocrine   Type 2 diabetes mellitus with other specified complication (HCC) - Primary   Lab Results  Component Value Date   HGBA1C 5.9 (H) 10/22/2023   Well-controlled Associated with HTN and OSA Had added Ozempic  for insulin resistance and weight loss benefit - 2 mg qw Advised to follow diabetic diet F/u CMP and lipid panel Diabetic eye exam: Advised to follow up with Ophthalmology for diabetic eye exam      Relevant Medications   Semaglutide , 2 MG/DOSE, (OZEMPIC , 2 MG/DOSE,) 8 MG/3ML SOPN   Other Relevant Orders  CMP14+EGFR   Hemoglobin A1c   Type 2 diabetes mellitus with diabetic neuropathy, without long-term current use of insulin (HCC)   Subjective feeling of numbness and tingling of bilateral hands and feet - better now Could be due to diabetic neuropathy, continue Cymbalta  and Elavil  for now Used to take gabapentin, but was held due to hypersensitivity reaction Checked CBC, CMP, TSH, B12 levels      Relevant Medications   Semaglutide , 2 MG/DOSE, (OZEMPIC , 2 MG/DOSE,) 8 MG/3ML SOPN     Other   Fibromyalgia   Had added Elavil , has improved sleep and myalgias- later increased dose to 50 mg at bedtime to improve sleep quality On Cymbalta  60 mg QD and tramadol  50 mg PRN, will continue for now Tizanidine  4 mg PRN      Morbid obesity (HCC)   BMI Readings from Last 3 Encounters:  03/03/24 45.21 kg/m  11/02/23 44.45 kg/m  10/22/23 44.35 kg/m   Associated with HTN, DM and OSA Diet modification and moderate exercise advised On Ozempic  for type II DM Has lost 32 lbs since starting Ozempic , but has plateaued now - needs to follow strict low carb diet      Relevant Medications   Semaglutide , 2 MG/DOSE, (OZEMPIC , 2 MG/DOSE,) 8 MG/3ML SOPN   B12 deficiency   B12 deficiency can give neuropathic symptoms B12 injection today Advised to take oral vitamin B12 500 mcg QD      Other Visit Diagnoses        Encounter for immunization       Relevant Orders   Flu vaccine trivalent PF, 6mos and older(Flulaval,Afluria,Fluarix,Fluzone) (Completed)         Meds ordered this encounter  Medications   Semaglutide , 2 MG/DOSE, (OZEMPIC , 2 MG/DOSE,) 8 MG/3ML SOPN    Sig: Inject 2 mg into the skin every 7 (seven) days.    Dispense:  3 mL    Refill:  3   cyanocobalamin  (VITAMIN B12) injection 1,000 mcg    Follow-up: Return in about 4 months (around 07/02/2024) for DM  (after 07/02/24).    Suzzane MARLA Blanch, MD

## 2024-03-03 NOTE — Assessment & Plan Note (Signed)
 BP Readings from Last 1 Encounters:  03/03/24 112/69   Well-controlled with amlodipine  5 mg daily Counseled for compliance with the medications Advised to follow DASH diet and perform moderate exercise/walking at least 150 minutes/week

## 2024-03-03 NOTE — Assessment & Plan Note (Addendum)
 Advised to stay compliant with CPAP, has nasal mask She needs to focus on weight loss by following low carb diet If she does not lose weight with Ozempic , can consider Mounjaro, but coverage is a concern  Had sleep study many years ago, ordered home sleep study

## 2024-03-04 ENCOUNTER — Ambulatory Visit: Payer: Self-pay | Admitting: Internal Medicine

## 2024-03-04 LAB — CMP14+EGFR
ALT: 17 IU/L (ref 0–32)
AST: 17 IU/L (ref 0–40)
Albumin: 4.4 g/dL (ref 3.9–4.9)
Alkaline Phosphatase: 97 IU/L (ref 41–116)
BUN/Creatinine Ratio: 18 (ref 9–23)
BUN: 11 mg/dL (ref 6–20)
Bilirubin Total: 0.3 mg/dL (ref 0.0–1.2)
CO2: 22 mmol/L (ref 20–29)
Calcium: 9.3 mg/dL (ref 8.7–10.2)
Chloride: 101 mmol/L (ref 96–106)
Creatinine, Ser: 0.61 mg/dL (ref 0.57–1.00)
Globulin, Total: 2.5 g/dL (ref 1.5–4.5)
Glucose: 75 mg/dL (ref 70–99)
Potassium: 4.4 mmol/L (ref 3.5–5.2)
Sodium: 139 mmol/L (ref 134–144)
Total Protein: 6.9 g/dL (ref 6.0–8.5)
eGFR: 117 mL/min/1.73 (ref >59)

## 2024-03-04 LAB — HEMOGLOBIN A1C
Est. average glucose Bld gHb Est-mCnc: 123 mg/dL
Hgb A1c MFr Bld: 5.9 % — ABNORMAL HIGH (ref 4.8–5.6)

## 2024-03-05 ENCOUNTER — Encounter: Payer: Self-pay | Admitting: Internal Medicine

## 2024-03-08 ENCOUNTER — Encounter: Payer: Self-pay | Admitting: Internal Medicine

## 2024-03-08 NOTE — Progress Notes (Signed)
 Theresa Rosario

## 2024-03-09 ENCOUNTER — Ambulatory Visit: Payer: Self-pay

## 2024-03-09 NOTE — Telephone Encounter (Signed)
Noted patient scheduled

## 2024-03-09 NOTE — Telephone Encounter (Signed)
 FYI Only or Action Required?: FYI only for provider: appointment scheduled on 12.18.25.  Patient was last seen in primary care on 03/03/2024 by Tobie Suzzane POUR, MD.  Called Nurse Triage reporting Rash.  Symptoms began a week ago.  Interventions attempted: OTC medications: lotions, calamine lotion, anti itch cream, benadryl .  Symptoms are: unchanged.  Triage Disposition: See Physician Within 24 Hours  Patient/caregiver understands and will follow disposition?: Yes      Copied from CRM #[id number]. Topic: Clinical - Red Word Triage >> Mar 09, 2024  2:31 PM Tiffini S wrote: Red Word that prompted transfer to Nurse Triage: Patient is having a allergic reaction to celecoxib  (CELEBREX ) 200 MG capsule- took the last table on 03/06/24- forearm area is flaky, very itchy, and broken skin- believe is clearing up but is not sure Reason for Disposition  SEVERE itching (i.e., interferes with sleep, normal activities or school)  Answer Assessment - Initial Assessment Questions Bilateral upper arm rash and itchiness x 1 week. Pt is concerned for eczema vs allergic reaction. Pt ran out of zyrtec  and believes she may be allergic to the celebrex . She states she is allergic to motrin  and it causes a rash. She states is it red, bumpy and flaky. Last dose of celebrex  was Sunday. Denies higher acuity symptoms     1. APPEARANCE of RASH: What does the rash look like? (e.g., blisters, dry flaky skin, red spots, redness, sores)     Redness, some skin darker than her normal skin color, flaky 2. LOCATION: Where is the rash located?     Bilateral upper arm, left greater than right 3. COLOR: What color is the rash? (Note: It is difficult to assess rash color in people with darker-colored skin. When this situation occurs, simply ask the caller to describe what they see.)     Redness and darker than normal areas 4. ONSET: When did the rash begin?     Last Wednesday 5. FEVER: Do you have a fever? If  Yes, ask: What is your temperature, how was it measured, and when did it start?     denies 6. ITCHING: Does the rash itch? If Yes, ask: How bad is the itch? (Scale 1-10; or mild, moderate, severe)     Yes itchy, moderate 7. CAUSE: What do you think is causing the rash?     Eczema vs allergic reaction to celebrex  8. MEDICINE FACTORS: Have you started any new medicines within the last 2 weeks? (e.g., antibiotics)      No new medications 9. OTHER SYMPTOMS: Do you have any other symptoms? (e.g., dizziness, headache, sore throat, joint pain)     States she has had a headache after getting her flu shot.  Protocols used: Rash or Redness - [facility]

## 2024-03-10 ENCOUNTER — Ambulatory Visit: Admitting: Internal Medicine

## 2024-03-10 VITALS — BP 134/83 | HR 90 | Resp 18 | Ht 61.0 in | Wt 249.8 lb

## 2024-03-10 DIAGNOSIS — L239 Allergic contact dermatitis, unspecified cause: Secondary | ICD-10-CM | POA: Diagnosis not present

## 2024-03-10 NOTE — Patient Instructions (Signed)
 Please apply Clobetasol cream as prescribed for rash.

## 2024-03-10 NOTE — Progress Notes (Signed)
 Acute Office Visit  Subjective:    Patient ID: Theresa Rosario, female    DOB: February 16, 1986, 38 y.o.   MRN: [id number]  Chief Complaint  Patient presents with   Rash    Pt complains of rash on both arms since last Wednesday. Denies fever or swelling. It has improved since then. Wanting to make sure it is not allergic reaction to medications     HPI Patient is in today for complaint of rash on bilateral arms for the last 1 week, which was erythematous and raised initially.  It has become dark brownish rash now. She has tried applying vitamin E containing cream and lotion with mild relief.  She denies any recent change in soap, shampoo, lotion, moisturizer, cleaning supplies, etc.  Denies any lip swelling, dyspnea or wheezing.  Past Medical History:  Diagnosis Date   Abnormal chromosomal and genetic finding on antenatal screening mother 02/06/2020   On Horizon Nov 2021     Silent carrier for alpha-thalassemia (FOB____)  and increased carrier risk for SMA  (FOB____)   Anemia 12/30/2018   Blood in urine 10/18/2015   Condyloma of female genitalia 10/21/2012   Subclitoral, TCA 7/31    Depression    Diabetes mellitus without complication (HCC)    Fibromyalgia    Hx of hematuria    Hx of ovarian cyst    Hypertension    Irregular bleeding 12/02/2017   Kidney stone    History of kidney stones   Large breasts    Lumbago 11/11/2011   Migraine     Past Surgical History:  Procedure Laterality Date   DENTAL SURGERY     WISDOM TOOTH EXTRACTION Bilateral     Family History  Problem Relation Age of Onset   Diabetes Paternal Grandmother    Hypertension Paternal Grandmother    Diabetes Maternal Grandmother    Heart disease Maternal Grandfather    Hypertension Father    Diabetes Mother    Hypertension Mother    Parkinson's disease Mother    Colon cancer Mother 5   Coronary artery disease Other    Diabetes Other     Social History   Socioeconomic History   Marital status:  Significant Other    Spouse name: Not on file   Number of children: 2   Years of education: college   Highest education level: Master's degree (e.g., MA, MS, MEng, MEd, MSW, MBA)  Occupational History   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 Right-handed. 2-3 cups caffeine per day.   Social Drivers of Health   Tobacco Use: Medium Risk (03/10/2024)   Patient History    Smoking Tobacco Use: Former    Smokeless Tobacco Use: Never    Passive Exposure: Not on file  Financial Resource Strain: High Risk (03/10/2024)  Overall Financial Resource Strain (CARDIA)    Difficulty of Paying Living Expenses: Hard  Food Insecurity: Food Insecurity Present (03/10/2024)   Epic    Worried About Programme Researcher, Broadcasting/film/video in the Last Year: Sometimes true    Ran Out of Food in the Last Year: Sometimes true  Transportation Needs: No Transportation Needs (03/10/2024)   Epic    Lack of Transportation (Medical): No    Lack of Transportation (Non-Medical): No  Physical Activity: Insufficiently Active (03/10/2024)   Exercise Vital Sign    Days of Exercise per Week: 2 days    Minutes of Exercise per Session: 20 min  Stress: No Stress Concern Present (03/10/2024)   [employer] of Occupational Health - Occupational Stress Questionnaire    Feeling of Stress: Not at all  Social Connections: Moderately Integrated (03/10/2024)   Social Connection and  Isolation Panel    Frequency of Communication with Friends and Family: Three times a week    Frequency of Social Gatherings with Friends and Family: Once a week    Attends Religious Services: More than 4 times per year    Active Member of Clubs or Organizations: No    Attends Engineer, Structural: Not on file    Marital Status: Living with partner  Intimate Partner Violence: Not on file  Depression (PHQ2-9): Low Risk (03/03/2024)   Depression (PHQ2-9)    PHQ-2 Score: 0  Alcohol Screen: Low Risk (03/10/2024)   Alcohol Screen    Last Alcohol Screening Score (AUDIT): 1  Housing: Low Risk (03/10/2024)   Epic    Unable to Pay for Housing in the Last Year: No    Number of Times Moved in the Last Year: 0    Homeless in the Last Year: No  Utilities: Not on file  Health Literacy: Not on file    Outpatient Medications Prior to Visit  Medication Sig Dispense Refill   ACCU-CHEK GUIDE TEST test strip USE TO TEST BLOOD GLUCOSE EVERY MORNING, AT NOON AND EVERY NIGHT AT BEDTIME 100 strip 0   Accu-Chek Softclix Lancets lancets USE TO TEST BLOOD GLUCOSE EVERY MORNING, AT NOON AND EVERY NIGHT AT BEDTIME 100 each 0   acetaminophen  (TYLENOL ) 500 MG tablet Take 2 tablets (1,000 mg total) by mouth every 6 (six) hours. 30 tablet 0   amitriptyline  (ELAVIL ) 25 MG tablet Take 2 tablets (50 mg total) by mouth at bedtime. 60 tablet 11   amLODipine  (NORVASC ) 5 MG tablet TAKE 1 TABLET(5 MG) BY MOUTH DAILY 30 tablet 6   azelastine  (ASTELIN ) 0.1 % nasal spray Place 2 sprays into both nostrils daily. Use in each nostril as directed 30 mL 12   Blood Glucose Monitoring Suppl DEVI 1 each by Does not apply route in the morning, at noon, and at bedtime. May substitute to any manufacturer covered by patient's insurance. 1 each 0   celecoxib  (CELEBREX ) 200 MG capsule TAKE 1 CAPSULE(200 MG) BY MOUTH TWICE DAILY 30 capsule 1   cetirizine  (ZYRTEC ) 10 MG tablet TAKE 1 TABLET(10 MG) BY MOUTH DAILY 30 tablet 5    DULoxetine  (CYMBALTA ) 60 MG capsule Take 1 capsule (60 mg total) by mouth daily. 90 capsule 0   fluticasone  (FLONASE ) 50 MCG/ACT nasal spray Place 2 sprays into both nostrils daily. 16 g 6   naratriptan  (AMERGE) 2.5 MG tablet Take 1 tablet (2.5 mg total) by mouth as needed for migraine. Take one (1) tablet at onset of headache; if returns or does not resolve, may repeat after  4 hours; do not exceed five (5) mg in 24 hours. 10 tablet 11   norethindrone  (MICRONOR ) 0.35 MG tablet Take 1 tablet (0.35 mg total) by mouth daily. 84 tablet 1   pantoprazole  (PROTONIX ) 40 MG tablet Take 1 tablet (40 mg total) by mouth daily. 90 tablet 1   Rimegepant Sulfate (NURTEC) 75 MG TBDP Take 1 tablet (75 mg total) by mouth daily as needed (for migraine). 16 tablet 1   rizatriptan  (MAXALT -MLT) 10 MG disintegrating tablet Take 1 tablet (10 mg total) by mouth as needed. May repeat in 2 hours if needed 12 tablet 6   Semaglutide , 2 MG/DOSE, (OZEMPIC , 2 MG/DOSE,) 8 MG/3ML SOPN Inject 2 mg into the skin every 7 (seven) days. 3 mL 3   tiZANidine  (ZANAFLEX ) 4 MG tablet TAKE 1 TABLET(4 MG) BY MOUTH EVERY 6 HOURS AS NEEDED FOR MUSCLE SPASMS 30 tablet 11   traMADol  (ULTRAM ) 50 MG tablet Take 1 tablet (50 mg total) by mouth every 12 (twelve) hours as needed. 30 tablet 0   vitamin B-12 (CYANOCOBALAMIN ) 500 MCG tablet Take 1 tablet (500 mcg total) by mouth daily. 100 tablet 1   Vitamin D , Ergocalciferol , (DRISDOL ) 1.25 MG (50000 UNIT) CAPS capsule TAKE 1 CAPSULE BY MOUTH EVERY 7 DAYS 12 capsule 1   No facility-administered medications prior to visit.    Allergies[1]  Review of Systems  Constitutional:  Negative for chills and fever.  HENT:  Negative for sinus pressure, sinus pain and sore throat.   Eyes:  Negative for pain and discharge.  Respiratory:  Negative for cough and shortness of breath.   Cardiovascular:  Negative for chest pain and palpitations.  Gastrointestinal:  Positive for constipation. Negative for diarrhea,  nausea and vomiting.  Endocrine: Negative for polydipsia and polyuria.  Genitourinary:  Negative for dysuria and hematuria.  Musculoskeletal:  Positive for arthralgias, back pain and myalgias. Negative for neck pain and neck stiffness.  Skin:  Positive for rash.  Neurological:  Positive for numbness (B/l hands and feet) and headaches. Negative for dizziness, syncope and weakness.  Psychiatric/Behavioral:  Negative for agitation and behavioral problems.        Objective:    Physical Exam Vitals reviewed.  Constitutional:      General: She is not in acute distress.    Appearance: She is obese. She is not diaphoretic.  HENT:     Head: Normocephalic and atraumatic.     Nose: Nose normal.     Mouth/Throat:     Mouth: Mucous membranes are moist.  Eyes:     General: No scleral icterus.    Extraocular Movements: Extraocular movements intact.  Cardiovascular:     Rate and Rhythm: Normal rate and regular rhythm.     Heart sounds: Normal heart sounds. No murmur heard. Pulmonary:     Breath sounds: Normal breath sounds. No wheezing or rales.  Musculoskeletal:     Cervical back: Neck supple. No tenderness.     Right lower leg: No edema.     Left lower leg: No edema.  Skin:    General: Skin is warm.     Findings: Rash (Dark brownish rash over bilateral upper arms) present.  Neurological:     General: No focal deficit present.     Mental Status: She is alert and oriented to person, place, and time.     Cranial Nerves: No cranial nerve deficit.     Sensory: Sensory deficit (Intermittent - b/l hands and feet) present.     Motor:  No weakness.  Psychiatric:        Mood and Affect: Mood normal.        Behavior: Behavior normal.     BP 134/83   Pulse 90   Resp 18   Ht 5' 1 (1.549 m)   Wt 249 lb 12.8 oz (113.3 kg)   SpO2 98%   BMI 47.20 kg/m  Wt Readings from Last 3 Encounters:  03/10/24 249 lb 12.8 oz (113.3 kg)  03/03/24 247 lb 3.2 oz (112.1 kg)  11/02/23 243 lb (110.2 kg)         Assessment & Plan:   Problem List Items Addressed This Visit       Musculoskeletal and Integument   Allergic dermatitis - Primary   Earlier appearance looked like urticaria Unclear allergen Prescribed clobetasol cream for local relief Advised to continue to apply lotion or moisturizer for dry skin      Relevant Medications   clobetasol cream (TEMOVATE) 0.05 %     Meds ordered this encounter  Medications   clobetasol cream (TEMOVATE) 0.05 %    Sig: Apply 1 Application topically 2 (two) times daily.    Dispense:  60 g    Refill:  0     Lennin Osmond MARLA Blanch, MD     [1]  Allergies Allergen Reactions   Amoxicillin Hives   Banana Itching    Tongue itching   Motrin  [Ibuprofen ] Rash    MOTRIN  (Brand only) causes this allergy (tolerates ADVIL  brand)   Other Itching    Tree nuts cause tongue itching    Pineapple Itching    Tongue itching

## 2024-03-10 NOTE — Assessment & Plan Note (Signed)
 Earlier appearance looked like urticaria Unclear allergen Prescribed clobetasol cream for local relief Advised to continue to apply lotion or moisturizer for dry skin

## 2024-03-14 ENCOUNTER — Other Ambulatory Visit: Payer: Self-pay | Admitting: Internal Medicine

## 2024-03-14 DIAGNOSIS — Z308 Encounter for other contraceptive management: Secondary | ICD-10-CM

## 2024-03-15 ENCOUNTER — Other Ambulatory Visit: Payer: Self-pay | Admitting: Advanced Practice Midwife

## 2024-03-16 ENCOUNTER — Encounter: Payer: Self-pay | Admitting: Internal Medicine

## 2024-03-16 ENCOUNTER — Other Ambulatory Visit: Payer: Self-pay

## 2024-03-16 MED ORDER — AMLODIPINE BESYLATE 5 MG PO TABS: 5.0000 mg | ORAL_TABLET | Freq: Every day | ORAL | 6 refills | Status: AC

## 2024-04-12 ENCOUNTER — Other Ambulatory Visit: Payer: Self-pay | Admitting: Internal Medicine

## 2024-04-12 DIAGNOSIS — L249 Irritant contact dermatitis, unspecified cause: Secondary | ICD-10-CM

## 2024-07-07 ENCOUNTER — Ambulatory Visit: Payer: Self-pay | Admitting: Internal Medicine
